# Patient Record
Sex: Female | Born: 1945
Health system: Southern US, Community
[De-identification: ages and names within clinical notes are randomized; demographics above are authoritative.]

## PROBLEM LIST (undated history)

## (undated) DIAGNOSIS — E782 Mixed hyperlipidemia: Secondary | ICD-10-CM

## (undated) DIAGNOSIS — R011 Cardiac murmur, unspecified: Secondary | ICD-10-CM

## (undated) DIAGNOSIS — C50919 Malignant neoplasm of unspecified site of unspecified female breast: Secondary | ICD-10-CM

## (undated) DIAGNOSIS — Z860100 Personal history of colon polyps, unspecified: Secondary | ICD-10-CM

## (undated) DIAGNOSIS — Z86718 Personal history of other venous thrombosis and embolism: Secondary | ICD-10-CM

## (undated) DIAGNOSIS — K5790 Diverticulosis of intestine, part unspecified, without perforation or abscess without bleeding: Secondary | ICD-10-CM

## (undated) DIAGNOSIS — Z8619 Personal history of other infectious and parasitic diseases: Secondary | ICD-10-CM

## (undated) DIAGNOSIS — I499 Cardiac arrhythmia, unspecified: Secondary | ICD-10-CM

## (undated) DIAGNOSIS — Z9221 Personal history of antineoplastic chemotherapy: Secondary | ICD-10-CM

## (undated) HISTORY — DX: Personal history of other infectious and parasitic diseases: Z86.19

## (undated) HISTORY — DX: Personal history of colon polyps, unspecified: Z86.0100

## (undated) HISTORY — DX: Personal history of other venous thrombosis and embolism: Z86.718

## (undated) HISTORY — PX: HERNIA REPAIR: SHX51

## (undated) HISTORY — DX: Mixed hyperlipidemia: E78.2

## (undated) HISTORY — DX: Diverticulosis of intestine, part unspecified, without perforation or abscess without bleeding: K57.90

## (undated) HISTORY — DX: Personal history of colonic polyps: Z86.010

## (undated) HISTORY — PX: TONSILLECTOMY: SUR1361

---

## 2013-05-25 ENCOUNTER — Encounter: Payer: Self-pay | Admitting: Cardiology

## 2014-02-16 ENCOUNTER — Encounter: Payer: Self-pay | Admitting: Cardiology

## 2014-03-02 ENCOUNTER — Encounter: Payer: Self-pay | Admitting: Cardiology

## 2014-03-07 ENCOUNTER — Encounter: Payer: Self-pay | Admitting: *Deleted

## 2014-03-08 ENCOUNTER — Other Ambulatory Visit: Payer: Self-pay | Admitting: *Deleted

## 2014-03-08 ENCOUNTER — Ambulatory Visit (INDEPENDENT_AMBULATORY_CARE_PROVIDER_SITE_OTHER): Payer: Medicare Other | Admitting: Cardiology

## 2014-03-08 ENCOUNTER — Encounter: Payer: Self-pay | Admitting: Cardiology

## 2014-03-08 VITALS — BP 122/72 | HR 72 | Ht 60.0 in | Wt 134.0 lb

## 2014-03-08 DIAGNOSIS — R002 Palpitations: Secondary | ICD-10-CM

## 2014-03-08 DIAGNOSIS — I471 Supraventricular tachycardia, unspecified: Secondary | ICD-10-CM

## 2014-03-08 DIAGNOSIS — R011 Cardiac murmur, unspecified: Secondary | ICD-10-CM

## 2014-03-08 DIAGNOSIS — E782 Mixed hyperlipidemia: Secondary | ICD-10-CM | POA: Insufficient documentation

## 2014-03-08 NOTE — Progress Notes (Signed)
Reason for visit: Palpitations  Clinical Summary Lisa Chapman is a 69 y.o.female referred for cardiology consultation by Dr. Woody Seller. Record review finds ER visit at [facility] on January 6 secondary to sudden onset dizziness and thoracic discomfort. Reportedly on EMS evaluation the patient's heart rate was 180 bpm. Labwork during evaluation showed potassium 3.8, BUN 11, creatinine 0.5, normal LFTs, hemoglobin 12.1, platelets 230, troponin I negative. No EMS strips available for review. ECG done during ER visit showed sinus rhythm with PAC, possible left atrial enlargement, nonspecific T-wave changes. Patient declined admission to the hospital for further evaluation at that time, saw Dr. Woody Seller in follow-up on January 13, and was started on Toprol-XL that time.  She describes the event to me today. States that she works in Geologist, engineering at [employer]. Was standing up working as usual when she suddenly felt dizzy and lightheaded, ultimately became aware of a sense of "fluttering" in her chest. She did not have any chest pain at that time. She did feel nauseated. EMS was summoned as noted above. Symptoms had improved by the time she was seen in the ER at which time her heart rate was down in normal range.  She did denies having any prolonged palpitations over the years, but does recall brief episodes, not as intense as the one in question. She feels better after starting beta blocker, has been taking Toprol-XL at only 12.5 mg daily. She has no reproducible exertional chest pain, has had no syncope with her palpitations.   Allergies  Allergen Reactions  . Boniva [Ibandronic Acid] Other (See Comments)    Epigastric discomfort  . Cortisone Hives    Current Outpatient Prescriptions  Medication Sig Dispense Refill  . metoprolol succinate (TOPROL-XL) 25 MG 24 hr tablet Take 12.5 mg by mouth daily.     . rosuvastatin (CRESTOR) 20 MG tablet Take 20 mg by mouth daily.     No current facility-administered medications  for this visit.    Past Medical History  Diagnosis Date  . Mixed hyperlipidemia   . Diverticulosis   . History of colonic polyps   . History of DVT (deep vein thrombosis)     Right leg, April 2015  . History of shingles     Past Surgical History  Procedure Laterality Date  . Tonsillectomy      Family History  Problem Relation Age of Onset  . COPD Father   . Heart attack Father   . Heart attack Mother     Social History Ms. Cleckley reports that she has never smoked. She has never used smokeless tobacco. Ms. Haapala reports that she does not drink alcohol.  Review of Systems Complete review of systems negative except as otherwise outlined in the clinical summary and also the following. No orthopnea or PND. No fevers or chills. No claudication or leg edema.  Physical Examination Filed Vitals:   03/08/14 0915  BP: 122/72  Pulse: 72    Wt Readings from Last 3 Encounters:  03/08/14 134 lb (60.782 kg)   Patient appears comfortable at rest. HEENT: Conjunctiva and lids normal, oropharynx clear. Neck: Supple, no elevated JVP or carotid bruits, no thyromegaly. Lungs: Clear to auscultation, nonlabored breathing at rest. Cardiac: Regular rate and rhythm, no S3, 2-3/6 coarse basal systolic murmur, no pericardial rub. Abdomen: Soft, nontender, bowel sounds present, no guarding or rebound. Extremities: No pitting edema, distal pulses 2+. Skin: Warm and dry. Musculoskeletal: No kyphosis. Neuropsychiatric: Alert and oriented x3, affect grossly appropriate.   Problem  List and Plan   Paroxysmal supraventricular tachycardia Presumptive diagnosis based on description of events and rapidity of reported heart rate response. She feels better on low-dose beta blocker. Baseline ECG reviewed. Plan is to obtain a seven-day cardiac monitor to see if might be able to capture a limited episodes to further confirm the diagnosis. I also explained that she should seek medical attention if she has  a prolonged episode, mainly to get a an ECG done to confirm the diagnosis and help guide further treatment.   Cardiac murmur Aortic position. Plan will be to obtain an echocardiogram to further evaluate cardiac structure and function, particularly in light of suspected PSVT.   Mixed hyperlipidemia On Crestor, followed by Dr. Woody Seller.     Satira Sark, M.D., F.A.C.C.

## 2014-03-08 NOTE — Assessment & Plan Note (Signed)
Aortic position. Plan will be to obtain an echocardiogram to further evaluate cardiac structure and function, particularly in light of suspected PSVT.

## 2014-03-08 NOTE — Assessment & Plan Note (Signed)
Presumptive diagnosis based on description of events and rapidity of reported heart rate response. She feels better on low-dose beta blocker. Baseline ECG reviewed. Plan is to obtain a seven-day cardiac monitor to see if might be able to capture a limited episodes to further confirm the diagnosis. I also explained that she should seek medical attention if she has a prolonged episode, mainly to get a an ECG done to confirm the diagnosis and help guide further treatment.

## 2014-03-08 NOTE — Patient Instructions (Signed)
Your physician recommends that you schedule a follow-up appointment in: 6 months. You will receive a reminder letter in the mail in about 4 months reminding you to call and schedule your appointment. If you don't receive this letter, please contact our office. Your physician recommends that you continue on your current medications as directed. Please refer to the Current Medication list given to you today. Your physician has requested that you have an echocardiogram. Echocardiography is a painless test that uses sound waves to create images of your heart. It provides your doctor with information about the size and shape of your heart and how well your heart's chambers and valves are working. This procedure takes approximately one hour. There are no restrictions for this procedure. Your physician has recommended that you wear a 7 day event monitor. Event monitors are medical devices that record the heart's electrical activity. Doctors most often [place] these monitors to diagnose arrhythmias. Arrhythmias are problems with the speed or rhythm of the heartbeat. The monitor is a small, portable device. You can wear one while you do your normal daily activities. This is usually used to diagnose what is causing palpitations/syncope (passing out).

## 2014-03-08 NOTE — Assessment & Plan Note (Signed)
On Crestor, followed by Dr. Woody Seller.

## 2014-03-10 ENCOUNTER — Other Ambulatory Visit (INDEPENDENT_AMBULATORY_CARE_PROVIDER_SITE_OTHER): Payer: Medicare Other

## 2014-03-10 ENCOUNTER — Other Ambulatory Visit: Payer: Self-pay

## 2014-03-10 DIAGNOSIS — I471 Supraventricular tachycardia: Secondary | ICD-10-CM

## 2014-03-10 DIAGNOSIS — R011 Cardiac murmur, unspecified: Secondary | ICD-10-CM

## 2014-03-10 DIAGNOSIS — R002 Palpitations: Secondary | ICD-10-CM

## 2014-03-15 ENCOUNTER — Telehealth: Payer: Self-pay | Admitting: *Deleted

## 2014-03-15 NOTE — Telephone Encounter (Signed)
-----   Message from Satira Sark, MD sent at 03/14/2014  9:14 AM EST ----- Reviewed report. Please let her know that cardiac contractile function is normal. The aortic valve is mildly calcified, but there is no stenosis. Also only mild mitral regurgitation. Overall reassuring results.

## 2014-03-15 NOTE — Telephone Encounter (Signed)
Patient informed. 

## 2014-09-16 ENCOUNTER — Telehealth: Payer: Self-pay | Admitting: Cardiology

## 2014-09-16 NOTE — Telephone Encounter (Signed)
Mrs. Herandez called in regards to a recall letter that she received for a six month follow up. She states that at this time she does not feel like she needs to be Seen by a cardiologist. Told her to call the office if she needs to set up an appointment.

## 2016-02-22 DIAGNOSIS — Z1231 Encounter for screening mammogram for malignant neoplasm of breast: Secondary | ICD-10-CM | POA: Diagnosis not present

## 2016-02-26 DIAGNOSIS — I10 Essential (primary) hypertension: Secondary | ICD-10-CM | POA: Diagnosis not present

## 2016-02-26 DIAGNOSIS — I8391 Asymptomatic varicose veins of right lower extremity: Secondary | ICD-10-CM | POA: Diagnosis not present

## 2016-02-26 DIAGNOSIS — Z87891 Personal history of nicotine dependence: Secondary | ICD-10-CM | POA: Diagnosis not present

## 2016-02-26 DIAGNOSIS — M810 Age-related osteoporosis without current pathological fracture: Secondary | ICD-10-CM | POA: Diagnosis not present

## 2016-02-26 DIAGNOSIS — Z299 Encounter for prophylactic measures, unspecified: Secondary | ICD-10-CM | POA: Diagnosis not present

## 2016-02-26 DIAGNOSIS — Z713 Dietary counseling and surveillance: Secondary | ICD-10-CM | POA: Diagnosis not present

## 2016-02-26 DIAGNOSIS — M79604 Pain in right leg: Secondary | ICD-10-CM | POA: Diagnosis not present

## 2016-02-26 DIAGNOSIS — Z6823 Body mass index (BMI) 23.0-23.9, adult: Secondary | ICD-10-CM | POA: Diagnosis not present

## 2016-02-26 DIAGNOSIS — Z79899 Other long term (current) drug therapy: Secondary | ICD-10-CM | POA: Diagnosis not present

## 2016-02-26 DIAGNOSIS — I82409 Acute embolism and thrombosis of unspecified deep veins of unspecified lower extremity: Secondary | ICD-10-CM | POA: Diagnosis not present

## 2016-03-22 DIAGNOSIS — Z713 Dietary counseling and surveillance: Secondary | ICD-10-CM | POA: Diagnosis not present

## 2016-03-22 DIAGNOSIS — Z6823 Body mass index (BMI) 23.0-23.9, adult: Secondary | ICD-10-CM | POA: Diagnosis not present

## 2016-03-22 DIAGNOSIS — M79672 Pain in left foot: Secondary | ICD-10-CM | POA: Diagnosis not present

## 2016-03-22 DIAGNOSIS — Z299 Encounter for prophylactic measures, unspecified: Secondary | ICD-10-CM | POA: Diagnosis not present

## 2016-04-10 DIAGNOSIS — T887XXA Unspecified adverse effect of drug or medicament, initial encounter: Secondary | ICD-10-CM | POA: Diagnosis not present

## 2016-05-29 DIAGNOSIS — I10 Essential (primary) hypertension: Secondary | ICD-10-CM | POA: Diagnosis not present

## 2016-05-29 DIAGNOSIS — R1032 Left lower quadrant pain: Secondary | ICD-10-CM | POA: Diagnosis not present

## 2016-05-29 DIAGNOSIS — Z79899 Other long term (current) drug therapy: Secondary | ICD-10-CM | POA: Diagnosis not present

## 2016-05-29 DIAGNOSIS — I82409 Acute embolism and thrombosis of unspecified deep veins of unspecified lower extremity: Secondary | ICD-10-CM | POA: Diagnosis not present

## 2016-05-29 DIAGNOSIS — M25512 Pain in left shoulder: Secondary | ICD-10-CM | POA: Diagnosis not present

## 2016-05-29 DIAGNOSIS — Z299 Encounter for prophylactic measures, unspecified: Secondary | ICD-10-CM | POA: Diagnosis not present

## 2016-05-29 DIAGNOSIS — Z713 Dietary counseling and surveillance: Secondary | ICD-10-CM | POA: Diagnosis not present

## 2016-05-29 DIAGNOSIS — Z6823 Body mass index (BMI) 23.0-23.9, adult: Secondary | ICD-10-CM | POA: Diagnosis not present

## 2016-05-29 DIAGNOSIS — B029 Zoster without complications: Secondary | ICD-10-CM | POA: Diagnosis not present

## 2016-06-05 DIAGNOSIS — I700 Atherosclerosis of aorta: Secondary | ICD-10-CM | POA: Diagnosis not present

## 2016-06-05 DIAGNOSIS — R1032 Left lower quadrant pain: Secondary | ICD-10-CM | POA: Diagnosis not present

## 2016-06-05 DIAGNOSIS — R109 Unspecified abdominal pain: Secondary | ICD-10-CM | POA: Diagnosis not present

## 2016-06-05 DIAGNOSIS — K5730 Diverticulosis of large intestine without perforation or abscess without bleeding: Secondary | ICD-10-CM | POA: Diagnosis not present

## 2016-06-07 DIAGNOSIS — I82409 Acute embolism and thrombosis of unspecified deep veins of unspecified lower extremity: Secondary | ICD-10-CM | POA: Diagnosis not present

## 2016-06-07 DIAGNOSIS — Z6823 Body mass index (BMI) 23.0-23.9, adult: Secondary | ICD-10-CM | POA: Diagnosis not present

## 2016-06-07 DIAGNOSIS — I10 Essential (primary) hypertension: Secondary | ICD-10-CM | POA: Diagnosis not present

## 2016-06-07 DIAGNOSIS — K5790 Diverticulosis of intestine, part unspecified, without perforation or abscess without bleeding: Secondary | ICD-10-CM | POA: Diagnosis not present

## 2016-06-07 DIAGNOSIS — K635 Polyp of colon: Secondary | ICD-10-CM | POA: Diagnosis not present

## 2016-06-07 DIAGNOSIS — Z299 Encounter for prophylactic measures, unspecified: Secondary | ICD-10-CM | POA: Diagnosis not present

## 2016-06-26 DIAGNOSIS — K5731 Diverticulosis of large intestine without perforation or abscess with bleeding: Secondary | ICD-10-CM | POA: Diagnosis not present

## 2016-06-26 DIAGNOSIS — Z86010 Personal history of colonic polyps: Secondary | ICD-10-CM | POA: Diagnosis not present

## 2016-06-26 DIAGNOSIS — R1032 Left lower quadrant pain: Secondary | ICD-10-CM | POA: Diagnosis not present

## 2016-07-03 DIAGNOSIS — Z6823 Body mass index (BMI) 23.0-23.9, adult: Secondary | ICD-10-CM | POA: Diagnosis not present

## 2016-07-03 DIAGNOSIS — M549 Dorsalgia, unspecified: Secondary | ICD-10-CM | POA: Diagnosis not present

## 2016-07-03 DIAGNOSIS — M4316 Spondylolisthesis, lumbar region: Secondary | ICD-10-CM | POA: Diagnosis not present

## 2016-07-03 DIAGNOSIS — M545 Low back pain: Secondary | ICD-10-CM | POA: Diagnosis not present

## 2016-07-03 DIAGNOSIS — M47816 Spondylosis without myelopathy or radiculopathy, lumbar region: Secondary | ICD-10-CM | POA: Diagnosis not present

## 2016-07-03 DIAGNOSIS — Z713 Dietary counseling and surveillance: Secondary | ICD-10-CM | POA: Diagnosis not present

## 2016-07-03 DIAGNOSIS — Z299 Encounter for prophylactic measures, unspecified: Secondary | ICD-10-CM | POA: Diagnosis not present

## 2016-07-23 DIAGNOSIS — R1032 Left lower quadrant pain: Secondary | ICD-10-CM | POA: Diagnosis not present

## 2016-07-23 DIAGNOSIS — Z86010 Personal history of colonic polyps: Secondary | ICD-10-CM | POA: Diagnosis not present

## 2016-09-10 DIAGNOSIS — Z6823 Body mass index (BMI) 23.0-23.9, adult: Secondary | ICD-10-CM | POA: Diagnosis not present

## 2016-09-10 DIAGNOSIS — I82409 Acute embolism and thrombosis of unspecified deep veins of unspecified lower extremity: Secondary | ICD-10-CM | POA: Diagnosis not present

## 2016-09-10 DIAGNOSIS — Z299 Encounter for prophylactic measures, unspecified: Secondary | ICD-10-CM | POA: Diagnosis not present

## 2016-09-10 DIAGNOSIS — I10 Essential (primary) hypertension: Secondary | ICD-10-CM | POA: Diagnosis not present

## 2016-09-10 DIAGNOSIS — E7800 Pure hypercholesterolemia, unspecified: Secondary | ICD-10-CM | POA: Diagnosis not present

## 2016-09-10 DIAGNOSIS — M810 Age-related osteoporosis without current pathological fracture: Secondary | ICD-10-CM | POA: Diagnosis not present

## 2016-09-10 DIAGNOSIS — Z1389 Encounter for screening for other disorder: Secondary | ICD-10-CM | POA: Diagnosis not present

## 2016-09-10 DIAGNOSIS — Z7189 Other specified counseling: Secondary | ICD-10-CM | POA: Diagnosis not present

## 2016-09-10 DIAGNOSIS — Z0000 Encounter for general adult medical examination without abnormal findings: Secondary | ICD-10-CM | POA: Diagnosis not present

## 2016-09-10 DIAGNOSIS — R5383 Other fatigue: Secondary | ICD-10-CM | POA: Diagnosis not present

## 2016-09-26 DIAGNOSIS — Z0000 Encounter for general adult medical examination without abnormal findings: Secondary | ICD-10-CM | POA: Diagnosis not present

## 2016-09-26 DIAGNOSIS — Z79899 Other long term (current) drug therapy: Secondary | ICD-10-CM | POA: Diagnosis not present

## 2016-09-26 DIAGNOSIS — E7800 Pure hypercholesterolemia, unspecified: Secondary | ICD-10-CM | POA: Diagnosis not present

## 2016-09-26 DIAGNOSIS — R5383 Other fatigue: Secondary | ICD-10-CM | POA: Diagnosis not present

## 2016-12-23 DIAGNOSIS — R69 Illness, unspecified: Secondary | ICD-10-CM | POA: Diagnosis not present

## 2017-01-07 DIAGNOSIS — R69 Illness, unspecified: Secondary | ICD-10-CM | POA: Diagnosis not present

## 2017-01-28 DIAGNOSIS — R69 Illness, unspecified: Secondary | ICD-10-CM | POA: Diagnosis not present

## 2017-03-12 DIAGNOSIS — R69 Illness, unspecified: Secondary | ICD-10-CM | POA: Diagnosis not present

## 2017-03-19 DIAGNOSIS — R69 Illness, unspecified: Secondary | ICD-10-CM | POA: Diagnosis not present

## 2017-03-31 DIAGNOSIS — Z299 Encounter for prophylactic measures, unspecified: Secondary | ICD-10-CM | POA: Diagnosis not present

## 2017-03-31 DIAGNOSIS — R109 Unspecified abdominal pain: Secondary | ICD-10-CM | POA: Diagnosis not present

## 2017-03-31 DIAGNOSIS — Z713 Dietary counseling and surveillance: Secondary | ICD-10-CM | POA: Diagnosis not present

## 2017-03-31 DIAGNOSIS — R1032 Left lower quadrant pain: Secondary | ICD-10-CM | POA: Diagnosis not present

## 2017-03-31 DIAGNOSIS — Z6824 Body mass index (BMI) 24.0-24.9, adult: Secondary | ICD-10-CM | POA: Diagnosis not present

## 2017-04-02 DIAGNOSIS — I10 Essential (primary) hypertension: Secondary | ICD-10-CM | POA: Diagnosis not present

## 2017-04-03 DIAGNOSIS — R1032 Left lower quadrant pain: Secondary | ICD-10-CM | POA: Diagnosis not present

## 2017-04-03 DIAGNOSIS — K5730 Diverticulosis of large intestine without perforation or abscess without bleeding: Secondary | ICD-10-CM | POA: Diagnosis not present

## 2017-04-03 DIAGNOSIS — N281 Cyst of kidney, acquired: Secondary | ICD-10-CM | POA: Diagnosis not present

## 2017-04-03 DIAGNOSIS — K5790 Diverticulosis of intestine, part unspecified, without perforation or abscess without bleeding: Secondary | ICD-10-CM | POA: Diagnosis not present

## 2017-04-16 DIAGNOSIS — K5790 Diverticulosis of intestine, part unspecified, without perforation or abscess without bleeding: Secondary | ICD-10-CM | POA: Diagnosis not present

## 2017-04-16 DIAGNOSIS — Z6825 Body mass index (BMI) 25.0-25.9, adult: Secondary | ICD-10-CM | POA: Diagnosis not present

## 2017-04-16 DIAGNOSIS — S39011A Strain of muscle, fascia and tendon of abdomen, initial encounter: Secondary | ICD-10-CM | POA: Diagnosis not present

## 2017-04-16 DIAGNOSIS — Z299 Encounter for prophylactic measures, unspecified: Secondary | ICD-10-CM | POA: Diagnosis not present

## 2017-04-16 DIAGNOSIS — R109 Unspecified abdominal pain: Secondary | ICD-10-CM | POA: Diagnosis not present

## 2017-04-16 DIAGNOSIS — I10 Essential (primary) hypertension: Secondary | ICD-10-CM | POA: Diagnosis not present

## 2017-05-01 DIAGNOSIS — R1032 Left lower quadrant pain: Secondary | ICD-10-CM | POA: Diagnosis not present

## 2017-06-12 DIAGNOSIS — R1032 Left lower quadrant pain: Secondary | ICD-10-CM | POA: Diagnosis not present

## 2017-06-12 DIAGNOSIS — Z299 Encounter for prophylactic measures, unspecified: Secondary | ICD-10-CM | POA: Diagnosis not present

## 2017-06-12 DIAGNOSIS — E7800 Pure hypercholesterolemia, unspecified: Secondary | ICD-10-CM | POA: Diagnosis not present

## 2017-06-12 DIAGNOSIS — Z6824 Body mass index (BMI) 24.0-24.9, adult: Secondary | ICD-10-CM | POA: Diagnosis not present

## 2017-06-12 DIAGNOSIS — I82409 Acute embolism and thrombosis of unspecified deep veins of unspecified lower extremity: Secondary | ICD-10-CM | POA: Diagnosis not present

## 2017-06-30 ENCOUNTER — Encounter (INDEPENDENT_AMBULATORY_CARE_PROVIDER_SITE_OTHER): Payer: Self-pay | Admitting: Internal Medicine

## 2017-06-30 ENCOUNTER — Ambulatory Visit (INDEPENDENT_AMBULATORY_CARE_PROVIDER_SITE_OTHER): Payer: Medicare HMO | Admitting: Internal Medicine

## 2017-06-30 VITALS — BP 146/72 | HR 76 | Temp 98.6°F | Ht 60.0 in | Wt 141.0 lb

## 2017-06-30 DIAGNOSIS — R1012 Left upper quadrant pain: Secondary | ICD-10-CM | POA: Diagnosis not present

## 2017-06-30 DIAGNOSIS — M545 Low back pain: Secondary | ICD-10-CM | POA: Diagnosis not present

## 2017-06-30 NOTE — Progress Notes (Addendum)
   Subjective:    Patient ID: Lisa Chapman, female    DOB: 1945/08/19, 72 y.o.   MRN: [id number]  HPI Referred by Dr. Woody Seller for LLQ pain. CT scan 04/03/2017 revealed diverticulosis without diverticulitis. Stable appearing rt renal cyst. No acute abnormalities. Her last colonoscopy in 2018 by Dr Posey Pronto for this pain. She says the colonoscopy was normal. She had a  Colonoscopy in 2011 by Dr. Posey Pronto which revealed moderate diverticulosis in the sigmoid colon. Sessile polyp in ;the cecum. Sessile polyp in the sigmoid colon, Sessile polyp in the ascending colon. Biopsy: Tubular adenoma, Tubulovillous adenoma.  She tells me sometimes she has pain in her LLQ. She says the pain will get so bad she feels like she is being torn apart. The pain comes and goes.  She says it leaves her skin feel raw to the touch. Her last pelvic exam was a couple of years ago.  No weight loss. Appetite is good. She has a BM daily.      Review of Systems Past Medical History:  Diagnosis Date  . Diverticulosis   . History of colonic polyps   . History of DVT (deep vein thrombosis)    Right leg, April 2015  . History of shingles   . Mixed hyperlipidemia     Past Surgical History:  Procedure Laterality Date  . TONSILLECTOMY      Allergies  Allergen Reactions  . Boniva [Ibandronic Acid] Other (See Comments)    Epigastric discomfort  . Cortisone Hives    Current Outpatient Medications on File Prior to Visit  Medication Sig Dispense Refill  . gabapentin (NEURONTIN) 100 MG capsule Take 100 mg by mouth 2 (two) times daily before a meal.    . rosuvastatin (CRESTOR) 20 MG tablet Take 20 mg by mouth daily.    . traMADol (ULTRAM) 50 MG tablet Take by mouth every 6 (six) hours as needed.     No current facility-administered medications on file prior to visit.         Objective:   Physical Exam Blood pressure (!) 146/72, pulse 76, temperature 98.6 F (37 C), height 5' (1.524 m), weight 141 lb (64 kg). Alert and  oriented. Skin warm and dry. Oral mucosa is moist.   . Sclera anicteric, conjunctivae is pink. Thyroid not enlarged. No cervical lymphadenopathy. Lungs clear. Heart regular rate and rhythm.  Abdomen is soft. Bowel sounds are positive. No hepatomegaly. No abdominal masses felt. No tenderness.  No edema to lower extremities.  Straight leg raises reproduced her LLQ pain and back pain.          Assessment & Plan:  LLQ pain. Back pain.  Suspect may be coming from her back. Will get a CT lumbar spine. Further recommendations to follow.

## 2017-06-30 NOTE — Patient Instructions (Signed)
CT Lumbar spine.

## 2017-07-01 NOTE — Addendum Note (Signed)
Addended by: Butch Penny on: 07/01/2017 09:21 AM   Modules accepted: Orders

## 2017-08-13 DIAGNOSIS — M545 Low back pain: Secondary | ICD-10-CM | POA: Diagnosis not present

## 2017-08-13 DIAGNOSIS — Z87891 Personal history of nicotine dependence: Secondary | ICD-10-CM | POA: Diagnosis not present

## 2017-08-13 DIAGNOSIS — Z299 Encounter for prophylactic measures, unspecified: Secondary | ICD-10-CM | POA: Diagnosis not present

## 2017-08-13 DIAGNOSIS — Z6824 Body mass index (BMI) 24.0-24.9, adult: Secondary | ICD-10-CM | POA: Diagnosis not present

## 2017-08-13 DIAGNOSIS — M25552 Pain in left hip: Secondary | ICD-10-CM | POA: Diagnosis not present

## 2017-08-13 DIAGNOSIS — E7800 Pure hypercholesterolemia, unspecified: Secondary | ICD-10-CM | POA: Diagnosis not present

## 2017-08-13 DIAGNOSIS — R1032 Left lower quadrant pain: Secondary | ICD-10-CM | POA: Diagnosis not present

## 2017-08-27 DIAGNOSIS — Z713 Dietary counseling and surveillance: Secondary | ICD-10-CM | POA: Diagnosis not present

## 2017-08-27 DIAGNOSIS — Z6824 Body mass index (BMI) 24.0-24.9, adult: Secondary | ICD-10-CM | POA: Diagnosis not present

## 2017-08-27 DIAGNOSIS — Z299 Encounter for prophylactic measures, unspecified: Secondary | ICD-10-CM | POA: Diagnosis not present

## 2017-08-27 DIAGNOSIS — R109 Unspecified abdominal pain: Secondary | ICD-10-CM | POA: Diagnosis not present

## 2017-09-18 DIAGNOSIS — K439 Ventral hernia without obstruction or gangrene: Secondary | ICD-10-CM | POA: Diagnosis not present

## 2017-09-18 DIAGNOSIS — R109 Unspecified abdominal pain: Secondary | ICD-10-CM | POA: Diagnosis not present

## 2017-09-26 DIAGNOSIS — K439 Ventral hernia without obstruction or gangrene: Secondary | ICD-10-CM | POA: Diagnosis not present

## 2017-09-26 DIAGNOSIS — G8918 Other acute postprocedural pain: Secondary | ICD-10-CM | POA: Diagnosis not present

## 2017-09-26 DIAGNOSIS — K429 Umbilical hernia without obstruction or gangrene: Secondary | ICD-10-CM | POA: Diagnosis not present

## 2017-10-06 DIAGNOSIS — I82431 Acute embolism and thrombosis of right popliteal vein: Secondary | ICD-10-CM | POA: Diagnosis not present

## 2017-10-06 DIAGNOSIS — Z9889 Other specified postprocedural states: Secondary | ICD-10-CM | POA: Diagnosis not present

## 2017-10-06 DIAGNOSIS — I10 Essential (primary) hypertension: Secondary | ICD-10-CM | POA: Diagnosis not present

## 2017-10-06 DIAGNOSIS — I82411 Acute embolism and thrombosis of right femoral vein: Secondary | ICD-10-CM | POA: Diagnosis not present

## 2017-10-06 DIAGNOSIS — I82409 Acute embolism and thrombosis of unspecified deep veins of unspecified lower extremity: Secondary | ICD-10-CM | POA: Diagnosis not present

## 2017-10-06 DIAGNOSIS — Z6824 Body mass index (BMI) 24.0-24.9, adult: Secondary | ICD-10-CM | POA: Diagnosis not present

## 2017-10-06 DIAGNOSIS — I808 Phlebitis and thrombophlebitis of other sites: Secondary | ICD-10-CM | POA: Diagnosis not present

## 2017-10-06 DIAGNOSIS — Z8719 Personal history of other diseases of the digestive system: Secondary | ICD-10-CM | POA: Diagnosis not present

## 2017-10-14 DIAGNOSIS — I8312 Varicose veins of left lower extremity with inflammation: Secondary | ICD-10-CM | POA: Diagnosis not present

## 2017-10-14 DIAGNOSIS — I8311 Varicose veins of right lower extremity with inflammation: Secondary | ICD-10-CM | POA: Diagnosis not present

## 2017-10-14 DIAGNOSIS — I82411 Acute embolism and thrombosis of right femoral vein: Secondary | ICD-10-CM | POA: Diagnosis not present

## 2017-10-14 DIAGNOSIS — I824Z1 Acute embolism and thrombosis of unspecified deep veins of right distal lower extremity: Secondary | ICD-10-CM | POA: Diagnosis not present

## 2017-10-23 DIAGNOSIS — Z48815 Encounter for surgical aftercare following surgery on the digestive system: Secondary | ICD-10-CM | POA: Diagnosis not present

## 2017-10-23 DIAGNOSIS — Z8719 Personal history of other diseases of the digestive system: Secondary | ICD-10-CM | POA: Diagnosis not present

## 2017-10-27 DIAGNOSIS — Z713 Dietary counseling and surveillance: Secondary | ICD-10-CM | POA: Diagnosis not present

## 2017-10-27 DIAGNOSIS — I10 Essential (primary) hypertension: Secondary | ICD-10-CM | POA: Diagnosis not present

## 2017-10-27 DIAGNOSIS — Z299 Encounter for prophylactic measures, unspecified: Secondary | ICD-10-CM | POA: Diagnosis not present

## 2017-10-27 DIAGNOSIS — Z6825 Body mass index (BMI) 25.0-25.9, adult: Secondary | ICD-10-CM | POA: Diagnosis not present

## 2017-10-27 DIAGNOSIS — I82409 Acute embolism and thrombosis of unspecified deep veins of unspecified lower extremity: Secondary | ICD-10-CM | POA: Diagnosis not present

## 2017-10-29 DIAGNOSIS — R69 Illness, unspecified: Secondary | ICD-10-CM | POA: Diagnosis not present

## 2017-11-10 DIAGNOSIS — E7800 Pure hypercholesterolemia, unspecified: Secondary | ICD-10-CM | POA: Diagnosis not present

## 2017-11-10 DIAGNOSIS — Z299 Encounter for prophylactic measures, unspecified: Secondary | ICD-10-CM | POA: Diagnosis not present

## 2017-11-10 DIAGNOSIS — I82409 Acute embolism and thrombosis of unspecified deep veins of unspecified lower extremity: Secondary | ICD-10-CM | POA: Diagnosis not present

## 2017-11-10 DIAGNOSIS — Z7189 Other specified counseling: Secondary | ICD-10-CM | POA: Diagnosis not present

## 2017-11-10 DIAGNOSIS — Z0000 Encounter for general adult medical examination without abnormal findings: Secondary | ICD-10-CM | POA: Diagnosis not present

## 2017-11-10 DIAGNOSIS — Z1331 Encounter for screening for depression: Secondary | ICD-10-CM | POA: Diagnosis not present

## 2017-11-10 DIAGNOSIS — I10 Essential (primary) hypertension: Secondary | ICD-10-CM | POA: Diagnosis not present

## 2017-11-10 DIAGNOSIS — Z1339 Encounter for screening examination for other mental health and behavioral disorders: Secondary | ICD-10-CM | POA: Diagnosis not present

## 2017-11-10 DIAGNOSIS — Z6824 Body mass index (BMI) 24.0-24.9, adult: Secondary | ICD-10-CM | POA: Diagnosis not present

## 2017-11-11 DIAGNOSIS — E7800 Pure hypercholesterolemia, unspecified: Secondary | ICD-10-CM | POA: Diagnosis not present

## 2017-11-11 DIAGNOSIS — Z0000 Encounter for general adult medical examination without abnormal findings: Secondary | ICD-10-CM | POA: Diagnosis not present

## 2017-11-11 DIAGNOSIS — Z79899 Other long term (current) drug therapy: Secondary | ICD-10-CM | POA: Diagnosis not present

## 2017-11-12 DIAGNOSIS — R69 Illness, unspecified: Secondary | ICD-10-CM | POA: Diagnosis not present

## 2017-11-24 DIAGNOSIS — R69 Illness, unspecified: Secondary | ICD-10-CM | POA: Diagnosis not present

## 2017-11-25 DIAGNOSIS — I8311 Varicose veins of right lower extremity with inflammation: Secondary | ICD-10-CM | POA: Diagnosis not present

## 2017-11-25 DIAGNOSIS — I824Z1 Acute embolism and thrombosis of unspecified deep veins of right distal lower extremity: Secondary | ICD-10-CM | POA: Diagnosis not present

## 2017-11-25 DIAGNOSIS — I8312 Varicose veins of left lower extremity with inflammation: Secondary | ICD-10-CM | POA: Diagnosis not present

## 2017-11-25 DIAGNOSIS — I82411 Acute embolism and thrombosis of right femoral vein: Secondary | ICD-10-CM | POA: Diagnosis not present

## 2018-01-19 DIAGNOSIS — R69 Illness, unspecified: Secondary | ICD-10-CM | POA: Diagnosis not present

## 2018-02-02 DIAGNOSIS — I83813 Varicose veins of bilateral lower extremities with pain: Secondary | ICD-10-CM | POA: Diagnosis not present

## 2018-02-02 DIAGNOSIS — I8311 Varicose veins of right lower extremity with inflammation: Secondary | ICD-10-CM | POA: Diagnosis not present

## 2018-02-02 DIAGNOSIS — I82411 Acute embolism and thrombosis of right femoral vein: Secondary | ICD-10-CM | POA: Diagnosis not present

## 2018-02-02 DIAGNOSIS — I8312 Varicose veins of left lower extremity with inflammation: Secondary | ICD-10-CM | POA: Diagnosis not present

## 2018-02-16 DIAGNOSIS — R69 Illness, unspecified: Secondary | ICD-10-CM | POA: Diagnosis not present

## 2018-03-05 DIAGNOSIS — I8312 Varicose veins of left lower extremity with inflammation: Secondary | ICD-10-CM | POA: Diagnosis not present

## 2018-03-05 DIAGNOSIS — I83812 Varicose veins of left lower extremities with pain: Secondary | ICD-10-CM | POA: Diagnosis not present

## 2018-03-09 DIAGNOSIS — I8312 Varicose veins of left lower extremity with inflammation: Secondary | ICD-10-CM | POA: Diagnosis not present

## 2018-03-12 DIAGNOSIS — I82409 Acute embolism and thrombosis of unspecified deep veins of unspecified lower extremity: Secondary | ICD-10-CM | POA: Diagnosis not present

## 2018-03-12 DIAGNOSIS — Z299 Encounter for prophylactic measures, unspecified: Secondary | ICD-10-CM | POA: Diagnosis not present

## 2018-03-12 DIAGNOSIS — Z6825 Body mass index (BMI) 25.0-25.9, adult: Secondary | ICD-10-CM | POA: Diagnosis not present

## 2018-03-12 DIAGNOSIS — Z789 Other specified health status: Secondary | ICD-10-CM | POA: Diagnosis not present

## 2018-03-12 DIAGNOSIS — I10 Essential (primary) hypertension: Secondary | ICD-10-CM | POA: Diagnosis not present

## 2018-03-12 DIAGNOSIS — E7800 Pure hypercholesterolemia, unspecified: Secondary | ICD-10-CM | POA: Diagnosis not present

## 2018-03-20 DIAGNOSIS — E2839 Other primary ovarian failure: Secondary | ICD-10-CM | POA: Diagnosis not present

## 2018-03-30 DIAGNOSIS — M722 Plantar fascial fibromatosis: Secondary | ICD-10-CM | POA: Diagnosis not present

## 2018-04-14 DIAGNOSIS — I10 Essential (primary) hypertension: Secondary | ICD-10-CM | POA: Diagnosis not present

## 2018-04-14 DIAGNOSIS — J069 Acute upper respiratory infection, unspecified: Secondary | ICD-10-CM | POA: Diagnosis not present

## 2018-04-14 DIAGNOSIS — Z299 Encounter for prophylactic measures, unspecified: Secondary | ICD-10-CM | POA: Diagnosis not present

## 2018-04-14 DIAGNOSIS — I82409 Acute embolism and thrombosis of unspecified deep veins of unspecified lower extremity: Secondary | ICD-10-CM | POA: Diagnosis not present

## 2018-04-14 DIAGNOSIS — R6889 Other general symptoms and signs: Secondary | ICD-10-CM | POA: Diagnosis not present

## 2018-04-14 DIAGNOSIS — Z6825 Body mass index (BMI) 25.0-25.9, adult: Secondary | ICD-10-CM | POA: Diagnosis not present

## 2018-05-01 DIAGNOSIS — I83812 Varicose veins of left lower extremities with pain: Secondary | ICD-10-CM | POA: Diagnosis not present

## 2018-05-01 DIAGNOSIS — I8312 Varicose veins of left lower extremity with inflammation: Secondary | ICD-10-CM | POA: Diagnosis not present

## 2018-06-22 DIAGNOSIS — R69 Illness, unspecified: Secondary | ICD-10-CM | POA: Diagnosis not present

## 2018-07-08 DIAGNOSIS — I8312 Varicose veins of left lower extremity with inflammation: Secondary | ICD-10-CM | POA: Diagnosis not present

## 2018-07-08 DIAGNOSIS — I83812 Varicose veins of left lower extremities with pain: Secondary | ICD-10-CM | POA: Diagnosis not present

## 2018-07-13 DIAGNOSIS — R69 Illness, unspecified: Secondary | ICD-10-CM | POA: Diagnosis not present

## 2018-08-17 DIAGNOSIS — H5203 Hypermetropia, bilateral: Secondary | ICD-10-CM | POA: Diagnosis not present

## 2018-08-17 DIAGNOSIS — H524 Presbyopia: Secondary | ICD-10-CM | POA: Diagnosis not present

## 2018-08-17 DIAGNOSIS — H52229 Regular astigmatism, unspecified eye: Secondary | ICD-10-CM | POA: Diagnosis not present

## 2018-08-17 DIAGNOSIS — H5213 Myopia, bilateral: Secondary | ICD-10-CM | POA: Diagnosis not present

## 2018-08-24 DIAGNOSIS — Z0100 Encounter for examination of eyes and vision without abnormal findings: Secondary | ICD-10-CM | POA: Diagnosis not present

## 2018-09-09 DIAGNOSIS — I83812 Varicose veins of left lower extremities with pain: Secondary | ICD-10-CM | POA: Diagnosis not present

## 2018-09-09 DIAGNOSIS — M7981 Nontraumatic hematoma of soft tissue: Secondary | ICD-10-CM | POA: Diagnosis not present

## 2018-09-09 DIAGNOSIS — I8312 Varicose veins of left lower extremity with inflammation: Secondary | ICD-10-CM | POA: Diagnosis not present

## 2018-09-23 DIAGNOSIS — M7981 Nontraumatic hematoma of soft tissue: Secondary | ICD-10-CM | POA: Diagnosis not present

## 2018-09-23 DIAGNOSIS — I8312 Varicose veins of left lower extremity with inflammation: Secondary | ICD-10-CM | POA: Diagnosis not present

## 2018-10-14 DIAGNOSIS — I10 Essential (primary) hypertension: Secondary | ICD-10-CM | POA: Diagnosis not present

## 2018-10-14 DIAGNOSIS — Z299 Encounter for prophylactic measures, unspecified: Secondary | ICD-10-CM | POA: Diagnosis not present

## 2018-10-14 DIAGNOSIS — Z6825 Body mass index (BMI) 25.0-25.9, adult: Secondary | ICD-10-CM | POA: Diagnosis not present

## 2018-10-14 DIAGNOSIS — R350 Frequency of micturition: Secondary | ICD-10-CM | POA: Diagnosis not present

## 2018-10-14 DIAGNOSIS — E7800 Pure hypercholesterolemia, unspecified: Secondary | ICD-10-CM | POA: Diagnosis not present

## 2018-10-14 DIAGNOSIS — I82409 Acute embolism and thrombosis of unspecified deep veins of unspecified lower extremity: Secondary | ICD-10-CM | POA: Diagnosis not present

## 2018-11-30 DIAGNOSIS — I10 Essential (primary) hypertension: Secondary | ICD-10-CM | POA: Diagnosis not present

## 2018-11-30 DIAGNOSIS — Z1211 Encounter for screening for malignant neoplasm of colon: Secondary | ICD-10-CM | POA: Diagnosis not present

## 2018-11-30 DIAGNOSIS — Z7189 Other specified counseling: Secondary | ICD-10-CM | POA: Diagnosis not present

## 2018-11-30 DIAGNOSIS — Z1331 Encounter for screening for depression: Secondary | ICD-10-CM | POA: Diagnosis not present

## 2018-11-30 DIAGNOSIS — Z6825 Body mass index (BMI) 25.0-25.9, adult: Secondary | ICD-10-CM | POA: Diagnosis not present

## 2018-11-30 DIAGNOSIS — Z79899 Other long term (current) drug therapy: Secondary | ICD-10-CM | POA: Diagnosis not present

## 2018-11-30 DIAGNOSIS — R5383 Other fatigue: Secondary | ICD-10-CM | POA: Diagnosis not present

## 2018-11-30 DIAGNOSIS — Z0000 Encounter for general adult medical examination without abnormal findings: Secondary | ICD-10-CM | POA: Diagnosis not present

## 2018-11-30 DIAGNOSIS — Z1339 Encounter for screening examination for other mental health and behavioral disorders: Secondary | ICD-10-CM | POA: Diagnosis not present

## 2018-11-30 DIAGNOSIS — E7800 Pure hypercholesterolemia, unspecified: Secondary | ICD-10-CM | POA: Diagnosis not present

## 2018-11-30 DIAGNOSIS — Z299 Encounter for prophylactic measures, unspecified: Secondary | ICD-10-CM | POA: Diagnosis not present

## 2018-12-17 DIAGNOSIS — M79605 Pain in left leg: Secondary | ICD-10-CM | POA: Diagnosis not present

## 2018-12-17 DIAGNOSIS — I8312 Varicose veins of left lower extremity with inflammation: Secondary | ICD-10-CM | POA: Diagnosis not present

## 2018-12-23 DIAGNOSIS — M25562 Pain in left knee: Secondary | ICD-10-CM | POA: Diagnosis not present

## 2018-12-23 DIAGNOSIS — I10 Essential (primary) hypertension: Secondary | ICD-10-CM | POA: Diagnosis not present

## 2018-12-23 DIAGNOSIS — Z6824 Body mass index (BMI) 24.0-24.9, adult: Secondary | ICD-10-CM | POA: Diagnosis not present

## 2018-12-23 DIAGNOSIS — Z713 Dietary counseling and surveillance: Secondary | ICD-10-CM | POA: Diagnosis not present

## 2018-12-23 DIAGNOSIS — Z299 Encounter for prophylactic measures, unspecified: Secondary | ICD-10-CM | POA: Diagnosis not present

## 2018-12-23 DIAGNOSIS — M1712 Unilateral primary osteoarthritis, left knee: Secondary | ICD-10-CM | POA: Diagnosis not present

## 2018-12-30 DIAGNOSIS — I824Z2 Acute embolism and thrombosis of unspecified deep veins of left distal lower extremity: Secondary | ICD-10-CM | POA: Diagnosis not present

## 2019-01-12 DIAGNOSIS — Z299 Encounter for prophylactic measures, unspecified: Secondary | ICD-10-CM | POA: Diagnosis not present

## 2019-01-12 DIAGNOSIS — I10 Essential (primary) hypertension: Secondary | ICD-10-CM | POA: Diagnosis not present

## 2019-01-12 DIAGNOSIS — Z6824 Body mass index (BMI) 24.0-24.9, adult: Secondary | ICD-10-CM | POA: Diagnosis not present

## 2019-01-12 DIAGNOSIS — M25362 Other instability, left knee: Secondary | ICD-10-CM | POA: Diagnosis not present

## 2019-01-12 DIAGNOSIS — I82409 Acute embolism and thrombosis of unspecified deep veins of unspecified lower extremity: Secondary | ICD-10-CM | POA: Diagnosis not present

## 2019-01-21 DIAGNOSIS — I10 Essential (primary) hypertension: Secondary | ICD-10-CM | POA: Diagnosis not present

## 2019-01-21 DIAGNOSIS — I82409 Acute embolism and thrombosis of unspecified deep veins of unspecified lower extremity: Secondary | ICD-10-CM | POA: Diagnosis not present

## 2019-01-21 DIAGNOSIS — M1710 Unilateral primary osteoarthritis, unspecified knee: Secondary | ICD-10-CM | POA: Diagnosis not present

## 2019-01-21 DIAGNOSIS — Z299 Encounter for prophylactic measures, unspecified: Secondary | ICD-10-CM | POA: Diagnosis not present

## 2019-01-21 DIAGNOSIS — Z6824 Body mass index (BMI) 24.0-24.9, adult: Secondary | ICD-10-CM | POA: Diagnosis not present

## 2019-01-29 DIAGNOSIS — M25562 Pain in left knee: Secondary | ICD-10-CM | POA: Diagnosis not present

## 2019-02-03 DIAGNOSIS — M25561 Pain in right knee: Secondary | ICD-10-CM | POA: Diagnosis not present

## 2019-02-03 DIAGNOSIS — X58XXXA Exposure to other specified factors, initial encounter: Secondary | ICD-10-CM | POA: Diagnosis not present

## 2019-02-03 DIAGNOSIS — M1712 Unilateral primary osteoarthritis, left knee: Secondary | ICD-10-CM | POA: Diagnosis not present

## 2019-02-03 DIAGNOSIS — S83242A Other tear of medial meniscus, current injury, left knee, initial encounter: Secondary | ICD-10-CM | POA: Diagnosis not present

## 2019-02-11 ENCOUNTER — Ambulatory Visit: Payer: Medicare HMO | Admitting: Orthopaedic Surgery

## 2019-02-16 DIAGNOSIS — R69 Illness, unspecified: Secondary | ICD-10-CM | POA: Diagnosis not present

## 2019-02-19 DIAGNOSIS — M25572 Pain in left ankle and joints of left foot: Secondary | ICD-10-CM | POA: Diagnosis not present

## 2019-02-24 DIAGNOSIS — I82409 Acute embolism and thrombosis of unspecified deep veins of unspecified lower extremity: Secondary | ICD-10-CM | POA: Diagnosis not present

## 2019-02-24 DIAGNOSIS — I10 Essential (primary) hypertension: Secondary | ICD-10-CM | POA: Diagnosis not present

## 2019-02-24 DIAGNOSIS — Z01818 Encounter for other preprocedural examination: Secondary | ICD-10-CM | POA: Diagnosis not present

## 2019-02-24 DIAGNOSIS — Z6824 Body mass index (BMI) 24.0-24.9, adult: Secondary | ICD-10-CM | POA: Diagnosis not present

## 2019-02-24 DIAGNOSIS — M25562 Pain in left knee: Secondary | ICD-10-CM | POA: Diagnosis not present

## 2019-02-24 DIAGNOSIS — Z299 Encounter for prophylactic measures, unspecified: Secondary | ICD-10-CM | POA: Diagnosis not present

## 2019-03-08 ENCOUNTER — Telehealth: Payer: Self-pay | Admitting: Hematology and Oncology

## 2019-03-08 NOTE — Telephone Encounter (Signed)
Received a new hem referral from Dr. Woody Seller for surgical clearance for DVT/PE. Lisa Chapman returned my call to schedule a new appt on 2/1 at 345pm w/Dr. Lindi Adie.

## 2019-03-14 NOTE — Progress Notes (Signed)
[place] CONSULT NOTE  Patient Care Team: [name], MD as PCP - General (Internal Medicine)  CHIEF COMPLAINTS/PURPOSE OF CONSULTATION:  History of DVTs, clearance for surgery  HISTORY OF PRESENTING ILLNESS:  Lisa Chapman 74 y.o. female is here because of a history of chronic bilateral lower extremity DVTs, currently on treatment with Xarelto 10mg  daily. She is referred by Dr. Woody Seller for surgical clearance prior to undergoing left knee surgery. She presents to the clinic today for initial evaluation.  Her first blood clot was in 2016 when a sledgehammer fell on her leg and she was immobile for a few days.  This was a right leg DVT.  She took Xarelto for 6 months.  In 2018 she had hernia surgery and then subsequently developed right leg DVT and took Xarelto for 1 year.  In 2019 she had left leg vein stripping and then developed left leg DVT for which she has taken Xarelto at 10 mg daily.  It is unclear why 10 mg were chosen.  She never had any trouble taking Xarelto.  She once tried Eliquis and did not tolerate it. She was having multiple problems with her left knee and was diagnosed to have a meniscus tear and she is likely going to need orthopedic surgery by Dr. Charlynne Pander.  We were consulted to discuss perioperative anticoagulation management. Her major questions today are why does she have recurrent blood clots and what to do about the upcoming surgery.  I reviewed her records extensively and collaborated the history with the patient.  MEDICAL HISTORY:  Past Medical History:  Diagnosis Date  . Diverticulosis   . History of colonic polyps   . History of DVT (deep vein thrombosis)    Right leg, April 2015  . History of shingles   . Mixed hyperlipidemia     SURGICAL HISTORY: Past Surgical History:  Procedure Laterality Date  . TONSILLECTOMY      SOCIAL HISTORY: Social History   Socioeconomic History  . Marital status: Married    Spouse name: Not on file    . Number of children: Not on file  . Years of education: Not on file  . Highest education level: Not on file  Occupational History  . Not on file  Tobacco Use  . Smoking status: Never Smoker  . Smokeless tobacco: Never Used  Substance and Sexual Activity  . Alcohol use: No    Alcohol/week: 0.0 standard drinks  . Drug use: No  . Sexual activity: Not on file  Other Topics Concern  . Not on file  Social History Narrative  . Not on file   Social Determinants of Health   Financial Resource Strain:   . Difficulty of Paying Living Expenses: Not on file  Food Insecurity:   . Worried About Charity fundraiser in the Last Year: Not on file  . Ran Out of Food in the Last Year: Not on file  Transportation Needs:   . Lack of Transportation (Medical): Not on file  . Lack of Transportation (Non-Medical): Not on file  Physical Activity:   . Days of Exercise per Week: Not on file  . Minutes of Exercise per Session: Not on file  Stress:   . Feeling of Stress : Not on file  Social Connections:   . Frequency of Communication with Friends and Family: Not on file  . Frequency of Social Gatherings with Friends and Family: Not on file  . Attends Religious Services: Not on file  .  Active Member of Clubs or Organizations: Not on file  . Attends Archivist Meetings: Not on file  . Marital Status: Not on file  Intimate Partner Violence:   . Fear of Current or Ex-Partner: Not on file  . Emotionally Abused: Not on file  . Physically Abused: Not on file  . Sexually Abused: Not on file    FAMILY HISTORY: Family History  Problem Relation Age of Onset  . COPD Father   . Heart attack Father   . Heart attack Mother     ALLERGIES:  is allergic to boniva [ibandronic acid] and cortisone.  MEDICATIONS:  Current Outpatient Medications  Medication Sig Dispense Refill  . gabapentin (NEURONTIN) 100 MG capsule Take 100 mg by mouth 2 (two) times daily before a meal.    . rosuvastatin  (CRESTOR) 20 MG tablet Take 20 mg by mouth daily.    . traMADol (ULTRAM) 50 MG tablet Take by mouth every 6 (six) hours as needed.     No current facility-administered medications for this visit.    REVIEW OF SYSTEMS:   Constitutional: Denies fevers, chills or abnormal night sweats Eyes: Denies blurriness of vision, double vision or watery eyes Ears, nose, mouth, throat, and face: Denies mucositis or sore throat Respiratory: Denies cough, dyspnea or wheezes Cardiovascular: Denies palpitation, chest discomfort or lower extremity swelling Gastrointestinal:  Denies nausea, heartburn or change in bowel habits Skin: Denies abnormal skin rashes Lymphatics: Denies new lymphadenopathy or easy bruising Neurological:Denies numbness, tingling or new weaknesses Behavioral/Psych: Mood is stable, no new changes  Breast: Denies any palpable lumps or discharge All other systems were reviewed with the patient and are negative.  PHYSICAL EXAMINATION: ECOG PERFORMANCE STATUS: 1 - Symptomatic but completely ambulatory  Vitals:   03/15/19 1537  BP: (!) 158/80  Pulse: 76  Resp: 20  Temp: 98.2 F (36.8 C)  SpO2: 97%   Filed Weights   03/15/19 1537  Weight: 140 lb 6.4 oz (63.7 kg)    GENERAL:alert, no distress and comfortable SKIN: skin color, texture, turgor are normal, no rashes or significant lesions EYES: normal, conjunctiva are pink and non-injected, sclera clear OROPHARYNX:no exudate, no erythema and lips, buccal mucosa, and tongue normal  NECK: supple, thyroid normal size, non-tender, without nodularity LYMPH:  no palpable lymphadenopathy in the cervical, axillary or inguinal LUNGS: clear to auscultation and percussion with normal breathing effort HEART: regular rate & rhythm and no murmurs and no lower extremity edema ABDOMEN:abdomen soft, non-tender and normal bowel sounds Musculoskeletal:no cyanosis of digits and no clubbing  PSYCH: alert & oriented x 3 with fluent speech NEURO: no  focal motor/sensory deficits  LABORATORY DATA:  I have reviewed the data as listed No results found for: WBC, HGB, HCT, MCV, PLT No results found for: NA, K, CL, CO2  RADIOGRAPHIC STUDIES: I have personally reviewed the radiological reports and agreed with the findings in the report.  ASSESSMENT AND PLAN:  Recurrent bilateral lower extremity DVTs: Each of these episodes were following a immobilization event and therefore surgery is a major risk factor for her.  However explained to her about thrombosis threshold model where usually there is more than 1 risk factor for blood clots. We would like to perform hypercoagulability work-up to help her understand her baseline risks.  Some of these risk factors cannot be mitigated.  Perioperative care for knee surgery: I instructed her to stop Xarelto 72 hours prior to surgery.  She will start Lovenox 1 mg/kg subcu twice daily (60 mg  dose) for 3 days.  She will then have surgery and then day after surgery she can resume Xarelto. I discussed with her that the standard dose of Xarelto is 20 mg and hence I will send her a new prescription for that dosage.  She does not think there was any problem tolerating the 20 mg dose.  I sent the prescription for Lovenox as well. I will touch base with her in 1 week for a MyChart virtual visit to go over the results of the hypercoagulability work-up.  Irrespective of the lab results she will remain on anticoagulation for life.  All questions were answered. The patient knows to call the clinic with any problems, questions or concerns.   Rulon Eisenmenger, MD, MPH 03/15/2019    I, Molly Dorshimer, am acting as scribe for Nicholas Lose, MD.  I have reviewed the above documentation for accuracy and completeness, and I agree with the above.

## 2019-03-15 ENCOUNTER — Other Ambulatory Visit: Payer: Self-pay

## 2019-03-15 ENCOUNTER — Inpatient Hospital Stay: Payer: Medicare HMO

## 2019-03-15 ENCOUNTER — Inpatient Hospital Stay: Payer: Medicare HMO | Attending: Hematology and Oncology | Admitting: Hematology and Oncology

## 2019-03-15 VITALS — BP 158/80 | HR 76 | Temp 98.2°F | Resp 20 | Ht 60.0 in | Wt 140.4 lb

## 2019-03-15 DIAGNOSIS — Z86718 Personal history of other venous thrombosis and embolism: Secondary | ICD-10-CM | POA: Diagnosis not present

## 2019-03-15 DIAGNOSIS — Z01818 Encounter for other preprocedural examination: Secondary | ICD-10-CM | POA: Diagnosis not present

## 2019-03-15 DIAGNOSIS — E782 Mixed hyperlipidemia: Secondary | ICD-10-CM | POA: Insufficient documentation

## 2019-03-15 DIAGNOSIS — I82403 Acute embolism and thrombosis of unspecified deep veins of lower extremity, bilateral: Secondary | ICD-10-CM

## 2019-03-15 DIAGNOSIS — Z7901 Long term (current) use of anticoagulants: Secondary | ICD-10-CM | POA: Insufficient documentation

## 2019-03-15 HISTORY — PX: MENISCUS REPAIR: SHX5179

## 2019-03-15 MED ORDER — ENOXAPARIN SODIUM 60 MG/0.6ML ~~LOC~~ SOLN: 60.0000 mg | Freq: Two times a day (BID) | SUBCUTANEOUS | 0 refills | Status: DC

## 2019-03-15 MED ORDER — RIVAROXABAN 20 MG PO TABS: 20.0000 mg | ORAL_TABLET | Freq: Every day | ORAL | 11 refills | Status: DC

## 2019-03-16 ENCOUNTER — Telehealth: Payer: Self-pay | Admitting: Hematology and Oncology

## 2019-03-16 DIAGNOSIS — R69 Illness, unspecified: Secondary | ICD-10-CM | POA: Diagnosis not present

## 2019-03-16 LAB — LUPUS ANTICOAGULANT PANEL
DRVVT: 36.7 s (ref 0.0–47.0)
PTT Lupus Anticoagulant: 31 s (ref 0.0–51.9)

## 2019-03-16 LAB — PROTEIN S, TOTAL: Protein S Ag, Total: 104 % (ref 60–150)

## 2019-03-16 LAB — ANTITHROMBIN III ANTIGEN: AT III AG PPP IMM-ACNC: 92 % (ref 72–124)

## 2019-03-16 NOTE — Telephone Encounter (Signed)
I talk with patient regarding video visit 2/8

## 2019-03-17 LAB — PROTEIN C, TOTAL: Protein C, Total: 111 % (ref 60–150)

## 2019-03-18 LAB — PROTHROMBIN GENE MUTATION

## 2019-03-18 LAB — FACTOR 5 LEIDEN

## 2019-03-22 ENCOUNTER — Inpatient Hospital Stay (HOSPITAL_BASED_OUTPATIENT_CLINIC_OR_DEPARTMENT_OTHER): Payer: Medicare HMO | Admitting: Hematology and Oncology

## 2019-03-22 DIAGNOSIS — I824Z3 Acute embolism and thrombosis of unspecified deep veins of distal lower extremity, bilateral: Secondary | ICD-10-CM | POA: Diagnosis not present

## 2019-03-22 DIAGNOSIS — I82409 Acute embolism and thrombosis of unspecified deep veins of unspecified lower extremity: Secondary | ICD-10-CM | POA: Insufficient documentation

## 2019-03-22 NOTE — Assessment & Plan Note (Signed)
Recurrent bilateral lower extremity DVTs: Each of these episodes were following a immobilization event and therefore surgery is a major risk factor for her. 2016: First blood clot from the sledgehammer fell on the leg right leg DVT took Xarelto 6 months 2018: Status post hernia surgery right leg DVT: Took Xarelto for 1 year 2019: Left leg vein stripping: Left leg DVT: Currently on Xarelto Patient needs left knee meniscus surgery.  Hypercoagulability work-up 03/15/2019: Negative for lupus anticoagulant.  Also negative for factor V Leiden, prothrombin gene mutation, protein C, S, AT-III  I explained to the patient there is no other risk factor identified for her blood clots.  However given the recurrent episodes of DVT, she qualifies for lifelong anticoagulation.  Perioperative counseling: Previously provided her detailed instructions on perioperative management of knee surgery. Return to clinic on an as-needed basis.

## 2019-03-22 NOTE — Progress Notes (Signed)
  HEMATOLOGY-ONCOLOGY TELEPHONE VISIT PROGRESS NOTE  I connected with Lisa Chapman on 03/22/2019 at  1:45 PM EST by telephone conference and verified that I am speaking with the correct person using two identifiers.  I discussed the limitations, risks, security and privacy concerns of performing an evaluation and management service by Telephone and the availability of in person appointments.  I also discussed with the patient that there may be a patient responsible charge related to this service. The patient expressed understanding and agreed to proceed.  Patient's Location: Home Physician Location: Clinic  CHIEF COMPLIANT: Follow-up to review labs.   INTERVAL HISTORY: Lisa Chapman is a 74 y.o. female with above-mentioned history of chronic bilateral lower extremity DVTs, currently on treatment with Xarelto 10mg  daily.  She presents over MyChart today to discuss her labs.    Assessment Plan:  DVT (deep venous thrombosis) (HCC) Recurrent bilateral lower extremity DVTs: Each of these episodes were following a immobilization event and therefore surgery is a major risk factor for her. 2016: First blood clot from the sledgehammer fell on the leg right leg DVT took Xarelto 6 months 2018: Status post hernia surgery right leg DVT: Took Xarelto for 1 year 2019: Left leg vein stripping: Left leg DVT: Currently on Xarelto Patient needs left knee meniscus surgery.  Hypercoagulability work-up 03/15/2019: Negative for lupus anticoagulant.  Also negative for factor V Leiden, prothrombin gene mutation, protein C, S, AT-III  I explained to the patient there is no other risk factor identified for her blood clots.  However given the recurrent episodes of DVT, she qualifies for lifelong anticoagulation. We will send a message to her Orthopedic surgeon to proceed with surgery.  Perioperative counseling: Previously provided her detailed instructions on perioperative management of knee surgery. Return to clinic  on an as-needed basis.    I discussed the assessment and treatment plan with the patient. The patient was provided an opportunity to ask questions and all were answered. The patient agreed with the plan and demonstrated an understanding of the instructions. The patient was advised to call back or seek an in-person evaluation if the symptoms worsen or if the condition fails to improve as anticipated.   I provided 15 minutes of face-to-face MyChart video visit time during this encounter.    Rulon Eisenmenger, MD 03/22/2019   I, Molly Dorshimer, am acting as scribe for Nicholas Lose, MD.  I have reviewed the above documentation for accuracy and completeness, and I agree with the above.

## 2019-03-31 DIAGNOSIS — S83232A Complex tear of medial meniscus, current injury, left knee, initial encounter: Secondary | ICD-10-CM | POA: Diagnosis not present

## 2019-03-31 DIAGNOSIS — M2242 Chondromalacia patellae, left knee: Secondary | ICD-10-CM | POA: Diagnosis not present

## 2019-03-31 DIAGNOSIS — S83242A Other tear of medial meniscus, current injury, left knee, initial encounter: Secondary | ICD-10-CM | POA: Diagnosis not present

## 2019-03-31 DIAGNOSIS — M1712 Unilateral primary osteoarthritis, left knee: Secondary | ICD-10-CM | POA: Diagnosis not present

## 2019-03-31 DIAGNOSIS — Y999 Unspecified external cause status: Secondary | ICD-10-CM | POA: Diagnosis not present

## 2019-03-31 DIAGNOSIS — M948X6 Other specified disorders of cartilage, lower leg: Secondary | ICD-10-CM | POA: Diagnosis not present

## 2019-04-09 DIAGNOSIS — M25562 Pain in left knee: Secondary | ICD-10-CM | POA: Diagnosis not present

## 2019-04-14 DIAGNOSIS — M25562 Pain in left knee: Secondary | ICD-10-CM | POA: Diagnosis not present

## 2019-04-28 DIAGNOSIS — M25562 Pain in left knee: Secondary | ICD-10-CM | POA: Diagnosis not present

## 2019-05-05 DIAGNOSIS — M25562 Pain in left knee: Secondary | ICD-10-CM | POA: Diagnosis not present

## 2019-05-07 DIAGNOSIS — M25562 Pain in left knee: Secondary | ICD-10-CM | POA: Diagnosis not present

## 2019-05-12 DIAGNOSIS — M25562 Pain in left knee: Secondary | ICD-10-CM | POA: Diagnosis not present

## 2019-05-19 DIAGNOSIS — M25562 Pain in left knee: Secondary | ICD-10-CM | POA: Diagnosis not present

## 2019-05-21 DIAGNOSIS — M25562 Pain in left knee: Secondary | ICD-10-CM | POA: Diagnosis not present

## 2019-05-25 DIAGNOSIS — M25562 Pain in left knee: Secondary | ICD-10-CM | POA: Diagnosis not present

## 2019-06-01 DIAGNOSIS — M25562 Pain in left knee: Secondary | ICD-10-CM | POA: Diagnosis not present

## 2019-06-08 DIAGNOSIS — M25562 Pain in left knee: Secondary | ICD-10-CM | POA: Diagnosis not present

## 2019-06-15 DIAGNOSIS — M25562 Pain in left knee: Secondary | ICD-10-CM | POA: Diagnosis not present

## 2019-06-17 DIAGNOSIS — E7800 Pure hypercholesterolemia, unspecified: Secondary | ICD-10-CM | POA: Diagnosis not present

## 2019-06-17 DIAGNOSIS — I82409 Acute embolism and thrombosis of unspecified deep veins of unspecified lower extremity: Secondary | ICD-10-CM | POA: Diagnosis not present

## 2019-06-17 DIAGNOSIS — I10 Essential (primary) hypertension: Secondary | ICD-10-CM | POA: Diagnosis not present

## 2019-06-17 DIAGNOSIS — Z299 Encounter for prophylactic measures, unspecified: Secondary | ICD-10-CM | POA: Diagnosis not present

## 2019-06-17 DIAGNOSIS — Z79899 Other long term (current) drug therapy: Secondary | ICD-10-CM | POA: Diagnosis not present

## 2019-06-18 DIAGNOSIS — M25562 Pain in left knee: Secondary | ICD-10-CM | POA: Diagnosis not present

## 2019-06-22 DIAGNOSIS — M25562 Pain in left knee: Secondary | ICD-10-CM | POA: Diagnosis not present

## 2019-06-29 DIAGNOSIS — M25562 Pain in left knee: Secondary | ICD-10-CM | POA: Diagnosis not present

## 2019-07-06 DIAGNOSIS — M25562 Pain in left knee: Secondary | ICD-10-CM | POA: Diagnosis not present

## 2019-07-16 DIAGNOSIS — M25562 Pain in left knee: Secondary | ICD-10-CM | POA: Diagnosis not present

## 2019-08-13 DIAGNOSIS — M25561 Pain in right knee: Secondary | ICD-10-CM | POA: Diagnosis not present

## 2019-08-13 DIAGNOSIS — M25562 Pain in left knee: Secondary | ICD-10-CM | POA: Diagnosis not present

## 2019-08-19 DIAGNOSIS — Z0100 Encounter for examination of eyes and vision without abnormal findings: Secondary | ICD-10-CM | POA: Diagnosis not present

## 2019-08-20 DIAGNOSIS — I82409 Acute embolism and thrombosis of unspecified deep veins of unspecified lower extremity: Secondary | ICD-10-CM | POA: Diagnosis not present

## 2019-08-20 DIAGNOSIS — M79605 Pain in left leg: Secondary | ICD-10-CM | POA: Diagnosis not present

## 2019-08-20 DIAGNOSIS — Z87891 Personal history of nicotine dependence: Secondary | ICD-10-CM | POA: Diagnosis not present

## 2019-08-20 DIAGNOSIS — Z299 Encounter for prophylactic measures, unspecified: Secondary | ICD-10-CM | POA: Diagnosis not present

## 2019-08-20 DIAGNOSIS — M79604 Pain in right leg: Secondary | ICD-10-CM | POA: Diagnosis not present

## 2019-08-23 DIAGNOSIS — M47817 Spondylosis without myelopathy or radiculopathy, lumbosacral region: Secondary | ICD-10-CM | POA: Diagnosis not present

## 2019-08-23 DIAGNOSIS — M9973 Connective tissue and disc stenosis of intervertebral foramina of lumbar region: Secondary | ICD-10-CM | POA: Diagnosis not present

## 2019-08-23 DIAGNOSIS — I700 Atherosclerosis of aorta: Secondary | ICD-10-CM | POA: Diagnosis not present

## 2019-08-23 DIAGNOSIS — M545 Low back pain: Secondary | ICD-10-CM | POA: Diagnosis not present

## 2019-08-30 DIAGNOSIS — R52 Pain, unspecified: Secondary | ICD-10-CM | POA: Diagnosis not present

## 2019-08-30 DIAGNOSIS — Z8679 Personal history of other diseases of the circulatory system: Secondary | ICD-10-CM | POA: Diagnosis not present

## 2019-08-30 DIAGNOSIS — I83812 Varicose veins of left lower extremities with pain: Secondary | ICD-10-CM | POA: Diagnosis not present

## 2019-09-03 DIAGNOSIS — I82409 Acute embolism and thrombosis of unspecified deep veins of unspecified lower extremity: Secondary | ICD-10-CM | POA: Diagnosis not present

## 2019-09-03 DIAGNOSIS — E7800 Pure hypercholesterolemia, unspecified: Secondary | ICD-10-CM | POA: Diagnosis not present

## 2019-09-03 DIAGNOSIS — Z299 Encounter for prophylactic measures, unspecified: Secondary | ICD-10-CM | POA: Diagnosis not present

## 2019-09-03 DIAGNOSIS — M47817 Spondylosis without myelopathy or radiculopathy, lumbosacral region: Secondary | ICD-10-CM | POA: Diagnosis not present

## 2019-09-03 DIAGNOSIS — I10 Essential (primary) hypertension: Secondary | ICD-10-CM | POA: Diagnosis not present

## 2019-09-13 DIAGNOSIS — M545 Low back pain: Secondary | ICD-10-CM | POA: Diagnosis not present

## 2019-09-15 DIAGNOSIS — M545 Low back pain: Secondary | ICD-10-CM | POA: Diagnosis not present

## 2019-09-20 DIAGNOSIS — M545 Low back pain: Secondary | ICD-10-CM | POA: Diagnosis not present

## 2019-09-27 DIAGNOSIS — M545 Low back pain: Secondary | ICD-10-CM | POA: Diagnosis not present

## 2019-09-29 DIAGNOSIS — M545 Low back pain: Secondary | ICD-10-CM | POA: Diagnosis not present

## 2019-10-04 DIAGNOSIS — M545 Low back pain: Secondary | ICD-10-CM | POA: Diagnosis not present

## 2019-10-06 DIAGNOSIS — M545 Low back pain: Secondary | ICD-10-CM | POA: Diagnosis not present

## 2019-10-13 DIAGNOSIS — M545 Low back pain: Secondary | ICD-10-CM | POA: Diagnosis not present

## 2019-10-20 DIAGNOSIS — Z299 Encounter for prophylactic measures, unspecified: Secondary | ICD-10-CM | POA: Diagnosis not present

## 2019-10-20 DIAGNOSIS — M25562 Pain in left knee: Secondary | ICD-10-CM | POA: Diagnosis not present

## 2019-10-20 DIAGNOSIS — I10 Essential (primary) hypertension: Secondary | ICD-10-CM | POA: Diagnosis not present

## 2019-10-20 DIAGNOSIS — I82409 Acute embolism and thrombosis of unspecified deep veins of unspecified lower extremity: Secondary | ICD-10-CM | POA: Diagnosis not present

## 2019-10-20 DIAGNOSIS — E7800 Pure hypercholesterolemia, unspecified: Secondary | ICD-10-CM | POA: Diagnosis not present

## 2019-10-29 DIAGNOSIS — K429 Umbilical hernia without obstruction or gangrene: Secondary | ICD-10-CM | POA: Diagnosis not present

## 2019-10-29 DIAGNOSIS — I10 Essential (primary) hypertension: Secondary | ICD-10-CM | POA: Diagnosis not present

## 2019-10-29 DIAGNOSIS — K219 Gastro-esophageal reflux disease without esophagitis: Secondary | ICD-10-CM | POA: Diagnosis not present

## 2019-10-29 DIAGNOSIS — Z299 Encounter for prophylactic measures, unspecified: Secondary | ICD-10-CM | POA: Diagnosis not present

## 2019-10-29 DIAGNOSIS — R1033 Periumbilical pain: Secondary | ICD-10-CM | POA: Diagnosis not present

## 2019-11-11 DIAGNOSIS — R109 Unspecified abdominal pain: Secondary | ICD-10-CM | POA: Diagnosis not present

## 2019-11-25 DIAGNOSIS — Z7901 Long term (current) use of anticoagulants: Secondary | ICD-10-CM | POA: Diagnosis not present

## 2019-11-25 DIAGNOSIS — K429 Umbilical hernia without obstruction or gangrene: Secondary | ICD-10-CM | POA: Diagnosis not present

## 2019-11-25 DIAGNOSIS — Z86718 Personal history of other venous thrombosis and embolism: Secondary | ICD-10-CM | POA: Diagnosis not present

## 2019-12-02 DIAGNOSIS — R5383 Other fatigue: Secondary | ICD-10-CM | POA: Diagnosis not present

## 2019-12-02 DIAGNOSIS — Z1339 Encounter for screening examination for other mental health and behavioral disorders: Secondary | ICD-10-CM | POA: Diagnosis not present

## 2019-12-02 DIAGNOSIS — Z0000 Encounter for general adult medical examination without abnormal findings: Secondary | ICD-10-CM | POA: Diagnosis not present

## 2019-12-02 DIAGNOSIS — Z299 Encounter for prophylactic measures, unspecified: Secondary | ICD-10-CM | POA: Diagnosis not present

## 2019-12-02 DIAGNOSIS — Z1331 Encounter for screening for depression: Secondary | ICD-10-CM | POA: Diagnosis not present

## 2019-12-02 DIAGNOSIS — I10 Essential (primary) hypertension: Secondary | ICD-10-CM | POA: Diagnosis not present

## 2019-12-02 DIAGNOSIS — E7800 Pure hypercholesterolemia, unspecified: Secondary | ICD-10-CM | POA: Diagnosis not present

## 2019-12-02 DIAGNOSIS — Z7189 Other specified counseling: Secondary | ICD-10-CM | POA: Diagnosis not present

## 2019-12-13 DIAGNOSIS — R002 Palpitations: Secondary | ICD-10-CM | POA: Diagnosis not present

## 2019-12-13 DIAGNOSIS — R9431 Abnormal electrocardiogram [ECG] [EKG]: Secondary | ICD-10-CM | POA: Diagnosis not present

## 2019-12-13 DIAGNOSIS — Z87891 Personal history of nicotine dependence: Secondary | ICD-10-CM | POA: Diagnosis not present

## 2019-12-13 DIAGNOSIS — Z7901 Long term (current) use of anticoagulants: Secondary | ICD-10-CM | POA: Diagnosis not present

## 2019-12-13 DIAGNOSIS — Z79899 Other long term (current) drug therapy: Secondary | ICD-10-CM | POA: Diagnosis not present

## 2019-12-13 DIAGNOSIS — K420 Umbilical hernia with obstruction, without gangrene: Secondary | ICD-10-CM | POA: Diagnosis not present

## 2019-12-13 DIAGNOSIS — E785 Hyperlipidemia, unspecified: Secondary | ICD-10-CM | POA: Diagnosis not present

## 2019-12-13 DIAGNOSIS — I471 Supraventricular tachycardia: Secondary | ICD-10-CM | POA: Diagnosis not present

## 2019-12-13 DIAGNOSIS — K429 Umbilical hernia without obstruction or gangrene: Secondary | ICD-10-CM | POA: Diagnosis not present

## 2019-12-13 DIAGNOSIS — Z86718 Personal history of other venous thrombosis and embolism: Secondary | ICD-10-CM | POA: Diagnosis not present

## 2019-12-14 DIAGNOSIS — K429 Umbilical hernia without obstruction or gangrene: Secondary | ICD-10-CM | POA: Diagnosis not present

## 2019-12-14 DIAGNOSIS — Z20822 Contact with and (suspected) exposure to covid-19: Secondary | ICD-10-CM | POA: Diagnosis not present

## 2019-12-14 DIAGNOSIS — Z01812 Encounter for preprocedural laboratory examination: Secondary | ICD-10-CM | POA: Diagnosis not present

## 2019-12-15 DIAGNOSIS — Z01810 Encounter for preprocedural cardiovascular examination: Secondary | ICD-10-CM | POA: Diagnosis not present

## 2019-12-15 DIAGNOSIS — R0600 Dyspnea, unspecified: Secondary | ICD-10-CM | POA: Diagnosis not present

## 2019-12-15 DIAGNOSIS — R9431 Abnormal electrocardiogram [ECG] [EKG]: Secondary | ICD-10-CM | POA: Diagnosis not present

## 2019-12-15 DIAGNOSIS — R002 Palpitations: Secondary | ICD-10-CM | POA: Diagnosis not present

## 2019-12-15 DIAGNOSIS — Z01818 Encounter for other preprocedural examination: Secondary | ICD-10-CM | POA: Diagnosis not present

## 2019-12-15 DIAGNOSIS — K429 Umbilical hernia without obstruction or gangrene: Secondary | ICD-10-CM | POA: Diagnosis not present

## 2019-12-16 DIAGNOSIS — R9431 Abnormal electrocardiogram [ECG] [EKG]: Secondary | ICD-10-CM | POA: Diagnosis not present

## 2019-12-20 DIAGNOSIS — K430 Incisional hernia with obstruction, without gangrene: Secondary | ICD-10-CM | POA: Diagnosis not present

## 2019-12-20 DIAGNOSIS — K429 Umbilical hernia without obstruction or gangrene: Secondary | ICD-10-CM | POA: Diagnosis not present

## 2019-12-23 DIAGNOSIS — R002 Palpitations: Secondary | ICD-10-CM | POA: Diagnosis not present

## 2019-12-29 ENCOUNTER — Telehealth: Payer: Self-pay | Admitting: Internal Medicine

## 2019-12-29 NOTE — Telephone Encounter (Signed)
Follow Up:  Reference # [id number]    Need clarifications on directions for pt's Amiodarone 200 mg.Lisa Chapman

## 2019-12-29 NOTE — Telephone Encounter (Signed)
I returned call to [facility] and call was for a different patient

## 2020-01-13 DIAGNOSIS — K429 Umbilical hernia without obstruction or gangrene: Secondary | ICD-10-CM | POA: Diagnosis not present

## 2020-01-13 DIAGNOSIS — Z48815 Encounter for surgical aftercare following surgery on the digestive system: Secondary | ICD-10-CM | POA: Diagnosis not present

## 2020-01-31 DIAGNOSIS — Z299 Encounter for prophylactic measures, unspecified: Secondary | ICD-10-CM | POA: Diagnosis not present

## 2020-01-31 DIAGNOSIS — D6869 Other thrombophilia: Secondary | ICD-10-CM | POA: Diagnosis not present

## 2020-01-31 DIAGNOSIS — I10 Essential (primary) hypertension: Secondary | ICD-10-CM | POA: Diagnosis not present

## 2020-02-12 DIAGNOSIS — C801 Malignant (primary) neoplasm, unspecified: Secondary | ICD-10-CM

## 2020-02-12 HISTORY — DX: Malignant (primary) neoplasm, unspecified: C80.1

## 2020-03-09 ENCOUNTER — Other Ambulatory Visit: Payer: Self-pay | Admitting: Hematology and Oncology

## 2020-04-03 DIAGNOSIS — D492 Neoplasm of unspecified behavior of bone, soft tissue, and skin: Secondary | ICD-10-CM | POA: Diagnosis not present

## 2020-04-03 DIAGNOSIS — I700 Atherosclerosis of aorta: Secondary | ICD-10-CM | POA: Diagnosis not present

## 2020-04-03 DIAGNOSIS — I10 Essential (primary) hypertension: Secondary | ICD-10-CM | POA: Diagnosis not present

## 2020-04-03 DIAGNOSIS — Z299 Encounter for prophylactic measures, unspecified: Secondary | ICD-10-CM | POA: Diagnosis not present

## 2020-04-17 DIAGNOSIS — L570 Actinic keratosis: Secondary | ICD-10-CM | POA: Diagnosis not present

## 2020-04-17 DIAGNOSIS — I10 Essential (primary) hypertension: Secondary | ICD-10-CM | POA: Diagnosis not present

## 2020-04-17 DIAGNOSIS — Z299 Encounter for prophylactic measures, unspecified: Secondary | ICD-10-CM | POA: Diagnosis not present

## 2020-06-05 DIAGNOSIS — H524 Presbyopia: Secondary | ICD-10-CM | POA: Diagnosis not present

## 2020-06-05 DIAGNOSIS — H52229 Regular astigmatism, unspecified eye: Secondary | ICD-10-CM | POA: Diagnosis not present

## 2020-06-05 DIAGNOSIS — H5213 Myopia, bilateral: Secondary | ICD-10-CM | POA: Diagnosis not present

## 2020-06-05 DIAGNOSIS — H5203 Hypermetropia, bilateral: Secondary | ICD-10-CM | POA: Diagnosis not present

## 2020-07-31 DIAGNOSIS — Z299 Encounter for prophylactic measures, unspecified: Secondary | ICD-10-CM | POA: Diagnosis not present

## 2020-07-31 DIAGNOSIS — I82409 Acute embolism and thrombosis of unspecified deep veins of unspecified lower extremity: Secondary | ICD-10-CM | POA: Diagnosis not present

## 2020-07-31 DIAGNOSIS — D6869 Other thrombophilia: Secondary | ICD-10-CM | POA: Diagnosis not present

## 2020-07-31 DIAGNOSIS — D492 Neoplasm of unspecified behavior of bone, soft tissue, and skin: Secondary | ICD-10-CM | POA: Diagnosis not present

## 2020-07-31 DIAGNOSIS — I10 Essential (primary) hypertension: Secondary | ICD-10-CM | POA: Diagnosis not present

## 2020-09-04 DIAGNOSIS — Z0100 Encounter for examination of eyes and vision without abnormal findings: Secondary | ICD-10-CM | POA: Diagnosis not present

## 2020-09-18 DIAGNOSIS — N6311 Unspecified lump in the right breast, upper outer quadrant: Secondary | ICD-10-CM | POA: Diagnosis not present

## 2020-09-18 DIAGNOSIS — I700 Atherosclerosis of aorta: Secondary | ICD-10-CM | POA: Diagnosis not present

## 2020-09-18 DIAGNOSIS — D6869 Other thrombophilia: Secondary | ICD-10-CM | POA: Diagnosis not present

## 2020-09-18 DIAGNOSIS — Z299 Encounter for prophylactic measures, unspecified: Secondary | ICD-10-CM | POA: Diagnosis not present

## 2020-09-18 DIAGNOSIS — I10 Essential (primary) hypertension: Secondary | ICD-10-CM | POA: Diagnosis not present

## 2020-10-04 DIAGNOSIS — N6311 Unspecified lump in the right breast, upper outer quadrant: Secondary | ICD-10-CM | POA: Diagnosis not present

## 2020-10-04 DIAGNOSIS — R922 Inconclusive mammogram: Secondary | ICD-10-CM | POA: Diagnosis not present

## 2020-10-04 DIAGNOSIS — R9389 Abnormal findings on diagnostic imaging of other specified body structures: Secondary | ICD-10-CM | POA: Diagnosis not present

## 2020-10-11 DIAGNOSIS — N6311 Unspecified lump in the right breast, upper outer quadrant: Secondary | ICD-10-CM | POA: Diagnosis not present

## 2020-10-11 DIAGNOSIS — C50811 Malignant neoplasm of overlapping sites of right female breast: Secondary | ICD-10-CM | POA: Diagnosis not present

## 2020-10-11 DIAGNOSIS — R2231 Localized swelling, mass and lump, right upper limb: Secondary | ICD-10-CM | POA: Diagnosis not present

## 2020-10-11 DIAGNOSIS — C773 Secondary and unspecified malignant neoplasm of axilla and upper limb lymph nodes: Secondary | ICD-10-CM | POA: Diagnosis not present

## 2020-10-11 DIAGNOSIS — R590 Localized enlarged lymph nodes: Secondary | ICD-10-CM | POA: Diagnosis not present

## 2020-10-19 DIAGNOSIS — C50911 Malignant neoplasm of unspecified site of right female breast: Secondary | ICD-10-CM | POA: Diagnosis not present

## 2020-10-19 DIAGNOSIS — Z6827 Body mass index (BMI) 27.0-27.9, adult: Secondary | ICD-10-CM | POA: Diagnosis not present

## 2020-10-19 DIAGNOSIS — Z299 Encounter for prophylactic measures, unspecified: Secondary | ICD-10-CM | POA: Diagnosis not present

## 2020-10-19 DIAGNOSIS — Z789 Other specified health status: Secondary | ICD-10-CM | POA: Diagnosis not present

## 2020-10-19 DIAGNOSIS — I10 Essential (primary) hypertension: Secondary | ICD-10-CM | POA: Diagnosis not present

## 2020-10-19 DIAGNOSIS — I700 Atherosclerosis of aorta: Secondary | ICD-10-CM | POA: Diagnosis not present

## 2020-10-19 DIAGNOSIS — G4700 Insomnia, unspecified: Secondary | ICD-10-CM | POA: Diagnosis not present

## 2020-10-20 ENCOUNTER — Other Ambulatory Visit: Payer: Self-pay | Admitting: Surgery

## 2020-10-20 DIAGNOSIS — C50411 Malignant neoplasm of upper-outer quadrant of right female breast: Secondary | ICD-10-CM | POA: Diagnosis not present

## 2020-10-23 ENCOUNTER — Telehealth: Payer: Self-pay | Admitting: Hematology and Oncology

## 2020-10-23 ENCOUNTER — Other Ambulatory Visit: Payer: Self-pay | Admitting: *Deleted

## 2020-10-23 DIAGNOSIS — C50919 Malignant neoplasm of unspecified site of unspecified female breast: Secondary | ICD-10-CM

## 2020-10-23 NOTE — Telephone Encounter (Signed)
Scheduled appt per 9/12 referral. Pt's husband is aware of appt date and time.

## 2020-10-26 ENCOUNTER — Ambulatory Visit: Payer: Medicare HMO | Admitting: Hematology and Oncology

## 2020-10-26 NOTE — Pre-Procedure Instructions (Addendum)
Surgical Instructions    Your procedure is scheduled on Friday 11/03/20.   Report to [facility] Main Entrance "A" at 05:30 A.M., then check in with the Admitting office.  Call this number if you have problems the morning of surgery:  [phone]   If you have any questions prior to your surgery date call [phone]: Open Monday-Friday 8am-4pm    Remember:  Do not eat after midnight the night before your surgery  You may drink clear liquids until 04:30 A.M. the morning of your surgery.   Clear liquids allowed are: Water, Non-Citrus Juices (without pulp), Carbonated Beverages, Clear Tea, Black Coffee ONLY (NO MILK, CREAM OR POWDERED CREAMER of any kind), and Gatorade    Take these medicines the morning of surgery with A SIP OF WATER   rosuvastatin (CRESTOR)   acetaminophen (TYLENOL)- If needed  sodium chloride (OCEAN)- If needed     Please follow your surgeon's instructions regarding XARELTO. If you have not received instructions then you need to contact your surgeon's office for instructions.   As of today, STOP taking any Aspirin (unless otherwise instructed by your surgeon) Aleve, Naproxen, Ibuprofen, Motrin, Advil, Goody's, BC's, all herbal medications, fish oil, and all vitamins.          Do not wear jewelry or makeup Do not wear lotions, powders, perfumes/colognes, or deodorant. Do not shave 48 hours prior to surgery.  Men may shave face and neck. Do not bring valuables to the hospital. DO Not wear nail polish, gel polish, artificial nails, or any other type of covering on natural nails including finger and toenails. If patients have artificial nails, gel coating, etc. that need to be removed by a nail salon please have this removed prior to surgery or surgery may need to be canceled/delayed if the surgeon/ anesthesia feels like the patient is unable to be adequately monitored.             [facility] is not responsible for any belongings or valuables.  Do NOT Smoke  (Tobacco/Vaping)  24 hours prior to your procedure If you use a CPAP at night, you may bring your mask for your overnight stay.   Contacts, glasses, dentures or bridgework may not be worn into surgery, please bring cases for these belongings   For patients admitted to the hospital, discharge time will be determined by your treatment team.   Patients discharged the day of surgery will not be allowed to drive home, and someone needs to stay with them for 24 hours.  NO VISITORS WILL BE ALLOWED IN PRE-OP WHERE PATIENTS GET READY FOR SURGERY.  ONLY 1 SUPPORT PERSON MAY BE PRESENT WHILE YOU ARE IN SURGERY.  IF YOU ARE TO BE ADMITTED, ONCE YOU ARE IN YOUR ROOM YOU WILL BE ALLOWED TWO (2) VISITORS.  Minor children may have two parents present. Special consideration for safety and communication needs will be reviewed on a case by case basis.  Special instructions:    Oral Hygiene is also important to reduce your risk of infection.  Remember - BRUSH YOUR TEETH THE MORNING OF SURGERY WITH YOUR REGULAR TOOTHPASTE   [facility]- Preparing For Surgery  Before surgery, you can play an important role. Because skin is not sterile, your skin needs to be as free of germs as possible. You can reduce the number of germs on your skin by washing with CHG (chlorahexidine gluconate) Soap before surgery.  CHG is an antiseptic cleaner which kills germs and bonds with the skin to  continue killing germs even after washing.     Please do not use if you have an allergy to CHG or antibacterial soaps. If your skin becomes reddened/irritated stop using the CHG.  Do not shave (including legs and underarms) for at least 48 hours prior to first CHG shower. It is OK to shave your face.  Please follow these instructions carefully.     Shower the NIGHT BEFORE SURGERY and the MORNING OF SURGERY with CHG Soap.   If you chose to wash your hair, wash your hair first as usual with your normal shampoo. After you shampoo, rinse your  hair and body thoroughly to remove the shampoo.  Then [employer] and genitals (private parts) with your normal soap and rinse thoroughly to remove soap.  After that Use CHG Soap as you would any other liquid soap. You can apply CHG directly to the skin and wash gently with a scrungie or a clean washcloth.   Apply the CHG Soap to your body ONLY FROM THE NECK DOWN.  Do not use on open wounds or open sores. Avoid contact with your eyes, ears, mouth and genitals (private parts). Wash Face and genitals (private parts)  with your normal soap.   Wash thoroughly, paying special attention to the area where your surgery will be performed.  Thoroughly rinse your body with warm water from the neck down.  DO NOT shower/wash with your normal soap after using and rinsing off the CHG Soap.  Pat yourself dry with a CLEAN TOWEL.  Wear CLEAN PAJAMAS to bed the night before surgery  Place CLEAN SHEETS on your bed the night before your surgery  DO NOT SLEEP WITH PETS.   Day of Surgery:  Take a shower with CHG soap. Wear Clean/Comfortable clothing the morning of surgery Do not apply any deodorants/lotions.   Remember to brush your teeth WITH YOUR REGULAR TOOTHPASTE.   Please read over the following fact sheets that you were given.

## 2020-10-27 ENCOUNTER — Other Ambulatory Visit: Payer: Self-pay

## 2020-10-27 ENCOUNTER — Encounter (HOSPITAL_COMMUNITY)
Admission: RE | Admit: 2020-10-27 | Discharge: 2020-10-27 | Disposition: A | Discharge status: Home / Self Care | Payer: Medicare HMO | Source: Ambulatory Visit | Attending: Surgery | Admitting: Surgery

## 2020-10-27 ENCOUNTER — Encounter (HOSPITAL_COMMUNITY): Payer: Self-pay

## 2020-10-27 DIAGNOSIS — Z01818 Encounter for other preprocedural examination: Secondary | ICD-10-CM | POA: Insufficient documentation

## 2020-10-27 HISTORY — DX: Cardiac murmur, unspecified: R01.1

## 2020-10-27 HISTORY — DX: Cardiac arrhythmia, unspecified: I49.9

## 2020-10-27 LAB — CBC
HCT: 41.5 % (ref 36.0–46.0)
Hemoglobin: 12.8 g/dL (ref 12.0–15.0)
MCH: 26.9 pg (ref 26.0–34.0)
MCHC: 30.8 g/dL (ref 30.0–36.0)
MCV: 87.2 fL (ref 80.0–100.0)
Platelets: 252 10*3/uL (ref 150–400)
RBC: 4.76 MIL/uL (ref 3.87–5.11)
RDW: 14.7 % (ref 11.5–15.5)
WBC: 6.6 10*3/uL (ref 4.0–10.5)
nRBC: 0 % (ref 0.0–0.2)

## 2020-10-27 LAB — BASIC METABOLIC PANEL
Anion gap: 4 — ABNORMAL LOW (ref 5–15)
BUN: 9 mg/dL (ref 8–23)
CO2: 28 mmol/L (ref 22–32)
Calcium: 9.3 mg/dL (ref 8.9–10.3)
Chloride: 108 mmol/L (ref 98–111)
Creatinine, Ser: 0.61 mg/dL (ref 0.44–1.00)
GFR, Estimated: >60 mL/min (ref >60)
Glucose, Bld: 110 mg/dL — ABNORMAL HIGH (ref 70–99)
Potassium: 3.8 mmol/L (ref 3.5–5.1)
Sodium: 140 mmol/L (ref 135–145)

## 2020-10-27 NOTE — Progress Notes (Addendum)
PCP - Dr. Jerene Bears Cardiologist - denies  PPM/ICD - n/a Device Orders - n/a Rep Notified - n/a  Chest x-ray - n/a EKG - 10/27/20 Stress Test - denies ECHO - 03/10/14 Cardiac Cath - denies  Sleep Study - denies CPAP - n/a  Fasting Blood Sugar - n/a Checks Blood Sugar __  n/a __ times a day  Blood Thinner Instructions: Patient states she was instructed to stop taking Xarelto 2 days prior to surgery.  Aspirin Instructions: n/a  ERAS Protcol - Yes PRE-SURGERY Ensure or G2- n/a  COVID TEST- No. Ambulatory Surgery   Anesthesia review: Yes  Hx heart murmur and Paroxysmal supraventricular tachycardia   Patient denies shortness of breath, fever, cough and chest pain at PAT appointment   All instructions explained to the patient, with a verbal understanding of the material. Patient agrees to go over the instructions while at home for a better understanding. Patient also instructed to self quarantine after being tested for COVID-19. The opportunity to ask questions was provided.

## 2020-10-30 NOTE — Progress Notes (Signed)
[facility] NOTE  Patient Care Team: [name], MD as PCP - General (Internal Medicine) Mauro Kaufmann, RN as Oncology Nurse Navigator Rockwell Germany, RN as Oncology Nurse Navigator  CHIEF COMPLAINTS/PURPOSE OF CONSULTATION:  Newly diagnosed right breast cancer  HISTORY OF PRESENTING ILLNESS:  Lisa Chapman 75 y.o. female is here because of recent diagnosis of right breast cancer. She palpated a mass in her axilla. Diagnostic mammogram and [place] on 10/04/2020 showed a large 3.6 cm mass in the right breast as well as several enlarged lymph nodes. Biopsy on 10/11/2020 showed invasive ductal carcinoma with lymph nodes positive for cancer as well. She has been seen at the clinic previously for a history of DVTs with her last visit on 03/22/2019. She presents to the clinic today for initial evaluation and discussion of treatment options.   I reviewed her records extensively and collaborated the history with the patient.  SUMMARY OF ONCOLOGIC HISTORY: Oncology History  Malignant neoplasm of upper-outer quadrant of right breast in female, estrogen receptor positive ([facility])  10/04/2020 Initial Diagnosis   Palpable lump in the right breast, mammogram detected 3.6 cm mass with several axillary lymph nodes, biopsy revealed grade 1-2 IDC, lymph nodes positive for cancer, ER 40%, PR 0%, Ki-67 15%, HER2 positive 3+ by [facility]   10/31/2020 Cancer Staging   Staging form: Breast, AJCC 8th Edition - Clinical stage from 10/31/2020: Stage IIA (cT2, cN1, cM0, G2, ER+, PR-, HER2+) - Signed by Nicholas Lose, MD on 10/31/2020 Stage prefix: Initial diagnosis Histologic grading system: 3 grade system     MEDICAL HISTORY:  Past Medical History:  Diagnosis Date   Cancer ([place]) 2022   Breast Cancer   Diverticulosis    Dysrhythmia    Heart murmur    History of colonic polyps    History of DVT (deep vein thrombosis)    Right leg, April 2015   History of shingles    Mixed hyperlipidemia      SURGICAL HISTORY: Past Surgical History:  Procedure Laterality Date   HERNIA REPAIR     MENISCUS REPAIR Left 03/2019   TONSILLECTOMY      SOCIAL HISTORY: Social History   Socioeconomic History   Marital status: Married    Spouse name: Not on file   Number of children: Not on file   Years of education: Not on file   Highest education level: Not on file  Occupational History   Not on file  Tobacco Use   Smoking status: Never   Smokeless tobacco: Never  Vaping Use   Vaping Use: Never used  Substance and Sexual Activity   Alcohol use: No    Alcohol/week: 0.0 standard drinks   Drug use: No   Sexual activity: Not on file  Other Topics Concern   Not on file  Social History Narrative   Married. No children   Social Determinants of Radio broadcast assistant Strain: Not on file  Food Insecurity: Not on file  Transportation Needs: Not on file  Physical Activity: Not on file  Stress: Not on file  Social Connections: Not on file  Intimate Partner Violence: Not on file    FAMILY HISTORY: Family History  Problem Relation Age of Onset   Heart attack Mother    COPD Father    Heart attack Father    Emphysema Father     ALLERGIES:  is allergic to ibuprofen, other, boniva [ibandronic acid], and cortisone.  MEDICATIONS:  Current Outpatient Medications  Medication Sig Dispense Refill   acetaminophen (TYLENOL) 500 MG tablet Take 1,000 mg by mouth every 6 (six) hours as needed for mild pain.     rosuvastatin (CRESTOR) 20 MG tablet Take 20 mg by mouth daily.     sodium chloride (OCEAN) 0.65 % SOLN nasal spray Place 1 spray into both nostrils as needed for congestion.     XARELTO 20 MG TABS tablet TAKE 1 TABLET (20 MG TOTAL) BY MOUTH DAILY WITH SUPPER. (Patient taking differently: Take 20 mg by mouth daily with supper.) 30 tablet 11   No current facility-administered medications for this visit.    REVIEW OF SYSTEMS:   Constitutional: Denies fevers, chills or abnormal  night sweats Eyes: Denies blurriness of vision, double vision or watery eyes Ears, nose, mouth, throat, and face: Denies mucositis or sore throat Respiratory: Denies cough, dyspnea or wheezes Cardiovascular: Denies palpitation, chest discomfort or lower extremity swelling Gastrointestinal:  Denies nausea, heartburn or change in bowel habits Skin: Denies abnormal skin rashes Lymphatics: Denies new lymphadenopathy or easy bruising Neurological:Denies numbness, tingling or new weaknesses Behavioral/Psych: Mood is stable, no new changes  Breast: Palpable lump in the right axilla All other systems were reviewed with the patient and are negative.  PHYSICAL EXAMINATION: ECOG PERFORMANCE STATUS: 1 - Symptomatic but completely ambulatory  Vitals:   10/31/20 1249  BP: (!) 162/68  Pulse: 73  Resp: 18  Temp: 97.8 F (36.6 C)  SpO2: 95%   Filed Weights   10/31/20 1249  Weight: 151 lb 9.6 oz (68.8 kg)    GENERAL:alert, no distress and comfortable   BREAST: Palpable right axillary lymphadenopathy (exam performed in the presence of a chaperone)   LABORATORY DATA:  I have reviewed the data as listed Lab Results  Component Value Date   WBC 6.6 10/27/2020   HGB 12.8 10/27/2020   HCT 41.5 10/27/2020   MCV 87.2 10/27/2020   PLT 252 10/27/2020   Lab Results  Component Value Date   NA 140 10/27/2020   K 3.8 10/27/2020   CL 108 10/27/2020   CO2 28 10/27/2020    RADIOGRAPHIC STUDIES: I have personally reviewed the radiological reports and agreed with the findings in the report.  ASSESSMENT AND PLAN:  Malignant neoplasm of upper-outer quadrant of right breast in female, estrogen receptor positive ([facility]) Palpable lump in the right breast, mammogram detected 3.6 cm mass with several axillary lymph nodes, biopsy revealed grade 1-2 IDC, lymph nodes positive for cancer, ER 40%, PR 0%, Ki-67 15%, HER2 positive 3+ by [facility]  Pathology and radiology counseling: Discussed with the patient, the  details of pathology including the type of breast cancer,the clinical staging, the significance of ER, PR and HER-2/neu receptors and the implications for treatment. After reviewing the pathology in detail, we proceeded to discuss the different treatment options between surgery, radiation, chemotherapy, antiestrogen therapies.  Recommendation based on multidisciplinary tumor board: 1. Neoadjuvant chemotherapy with TCH Perjeta 6 cycles followed by Herceptin Perjeta maintenance versus Kadcyla maintenance (based on response to neoadjuvant chemo) for 1 year 2. Followed by breast conserving surgery with targeted node dissection 3. Followed by adjuvant radiation therapy   4.  Followed by antiestrogen therapy  5.  Followed back neratinib  Chemotherapy Counseling: I discussed the risks and benefits of chemotherapy including the risks of nausea/ vomiting, risk of infection from low WBC count, fatigue due to chemo or anemia, bruising or bleeding due to low platelets, mouth sores, loss/ change in taste and decreased appetite. Liver and  kidney function will be monitored through out chemotherapy as abnormalities in liver and kidney function may be a side effect of treatment. Cardiac dysfunction due to Herceptin and Perjeta and neuropathy risk from Taxotere were discussed in detail. Risk of permanent bone marrow dysfunction due to chemo were also discussed.  Plan: 1. Port placement 2. Echocardiogram 3. Chemotherapy class 4. Breast MRI  Return to clinic in 2 weeks to start chemotherapy.    All questions were answered. The patient knows to call the clinic with any problems, questions or concerns.   Rulon Eisenmenger, MD, MPH 10/31/2020    I, Thana Ates, am acting as scribe for Nicholas Lose, MD.  I have reviewed the above documentation for accuracy and completeness, and I agree with the above.

## 2020-10-30 NOTE — Anesthesia Preprocedure Evaluation (Addendum)
Anesthesia Evaluation  Patient identified by MRN, date of birth, ID band Patient awake    Reviewed: Allergy & Precautions, NPO status , Patient's Chart, lab work & pertinent test results  Airway Mallampati: III  TM Distance: >3 FB Neck ROM: Full    Dental  (+) Dental Advisory Given, Partial Upper, Teeth Intact,    Pulmonary neg pulmonary ROS,    Pulmonary exam normal breath sounds clear to auscultation       Cardiovascular + DVT (xarelto last dose 10/31/20)  Normal cardiovascular exam+ Valvular Problems/Murmurs (mild MR) MR  Rhythm:Regular Rate:Normal  Echo 2016: - Left ventricle: The cavity size was normal. Wall thickness was  normal. Systolic function was vigorous. The estimated ejection  fraction was in the range of 65% to 70%. Wall motion was normal;  there were no regional wall motion abnormalities. Left  ventricular diastolic function parameters were normal.  - Aortic valve: Mildly calcified annulus. Trileaflet; mildly  calcified leaflets.  - Mitral valve: Mildly thickened leaflets . There was mild  regurgitation.     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 Hypercoagulability work-up 03/15/2019: Negative for lupus anticoagulant. Also negative for factor V Leiden, prothrombin gene mutation, protein C, S, AT-III. She was advised to stay on anticoagulation life long given the recurrent episodes.    Anesthesia Other Findings Breast ca   Reproductive/Obstetrics negative OB ROS                             Anesthesia Physical Anesthesia Plan  ASA: 2  Anesthesia Plan: General   Post-op Pain Management:    Induction: Intravenous  PONV Risk Score and Plan: 3 and Ondansetron, Dexamethasone, Midazolam and Treatment may vary due to age or medical condition  Airway Management Planned: LMA  Additional Equipment: None  Intra-op Plan:   Post-operative Plan: Extubation in OR  Informed Consent: I have reviewed the patients History and Physical, chart, labs and discussed the procedure including the risks, benefits and alternatives for the proposed anesthesia with the patient or authorized representative who has indicated his/her understanding and acceptance.     Dental advisory given  Plan Discussed with: CRNA  Anesthesia Plan Comments: ( )       Anesthesia Quick Evaluation

## 2020-10-30 NOTE — Progress Notes (Signed)
Anesthesia Chart Review:  Patient noted to have abnormal EKG preop testing.  T wave versions concerning for possible ischemia.  Review of records in [place] shows she recently had similarly abnormal EKG November 2021 prior to undergoing umbilical hernia repair.  At that time, nuclear stress test was ordered to rule out ischemia.  Stress test was done 12/15/2019 and showed normal perfusion, normal LV function.  Full results below.  She subsequently underwent hernia repair [id number] without complication.  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 Hypercoagulability work-up 03/15/2019: Negative for lupus anticoagulant. Also negative for factor V Leiden, prothrombin gene mutation, protein C, S, AT-III. She was advised to stay on anticoagulation life long given the recurrent episodes.   Per surgery posting, patient to hold Xarelto 2 days prior to surgery.  Preop labs reviewed, unremarkable.  EKG 10/27/2020: Normal sinus rhythm. Rate 62. T wave abnormality, consider anterolateral ischemia  Nuclear stress 12/15/19: CONCLUSION: 1.) LIMITATIONS: None.  2.) MYOCARDIAL PERFUSION: Normal.  3.) LEFT VENTRICULAR EJECTION FRACTION: Normal.  4.) REGIONAL WALL MOTION: Normal.   Wynonia Musty [facility] Short Stay Center/Anesthesiology Phone [phone] 10/30/2020 3:20 PM

## 2020-10-31 ENCOUNTER — Encounter: Payer: Self-pay | Admitting: General Practice

## 2020-10-31 ENCOUNTER — Inpatient Hospital Stay: Payer: Medicare HMO | Attending: Hematology and Oncology | Admitting: Hematology and Oncology

## 2020-10-31 ENCOUNTER — Other Ambulatory Visit: Payer: Self-pay

## 2020-10-31 ENCOUNTER — Encounter: Payer: Self-pay | Admitting: *Deleted

## 2020-10-31 VITALS — BP 162/68 | HR 73 | Temp 97.8°F | Resp 18 | Ht 62.0 in | Wt 151.6 lb

## 2020-10-31 DIAGNOSIS — Z170 Estrogen receptor positive status [ER+]: Secondary | ICD-10-CM | POA: Diagnosis not present

## 2020-10-31 DIAGNOSIS — C50411 Malignant neoplasm of upper-outer quadrant of right female breast: Secondary | ICD-10-CM | POA: Insufficient documentation

## 2020-10-31 MED ORDER — PROCHLORPERAZINE MALEATE 10 MG PO TABS: 10.0000 mg | ORAL_TABLET | Freq: Four times a day (QID) | ORAL | 1 refills | Status: DC | PRN

## 2020-10-31 MED ORDER — DEXAMETHASONE 4 MG PO TABS: 4.0000 mg | ORAL_TABLET | Freq: Every day | ORAL | 0 refills | Status: DC

## 2020-10-31 MED ORDER — LIDOCAINE-PRILOCAINE 2.5-2.5 % EX CREA: TOPICAL_CREAM | CUTANEOUS | 3 refills | Status: DC

## 2020-10-31 MED ORDER — ONDANSETRON HCL 8 MG PO TABS: 8.0000 mg | ORAL_TABLET | Freq: Two times a day (BID) | ORAL | 1 refills | Status: DC | PRN

## 2020-10-31 NOTE — Progress Notes (Signed)
START ON PATHWAY REGIMEN - Breast ? ? ?  Cycle 1: A cycle is 21 days: ?    Pertuzumab  ?    Trastuzumab-xxxx  ?    Docetaxel  ?    Carboplatin  ?  Cycles 2 through 6: A cycle is every 21 days: ?    Pertuzumab  ?    Trastuzumab-xxxx  ?    Docetaxel  ?    Carboplatin  ? ?**Always confirm dose/schedule in your pharmacy ordering system** ? ?Patient Characteristics: ?Preoperative or Nonsurgical Candidate (Clinical Staging), Neoadjuvant Therapy followed by Surgery, Invasive Disease, Chemotherapy, HER2 Positive, ER Positive ?Therapeutic Status: Preoperative or Nonsurgical Candidate (Clinical Staging) ?AJCC M Category: cM0 ?AJCC Grade: G2 ?Breast Surgical Plan: Neoadjuvant Therapy followed by Surgery ?ER Status: Positive (+) ?AJCC 8 Stage Grouping: IIA ?HER2 Status: Positive (+) ?AJCC T Category: cT2 ?AJCC N Category: cN1 ?PR Status: Negative (-) ?Intent of Therapy: ?Curative Intent, Discussed with Patient ?

## 2020-10-31 NOTE — Assessment & Plan Note (Signed)
Palpable lump in the right breast, mammogram detected 3.6 cm mass with several axillary lymph nodes, biopsy revealed grade 1-2 IDC, lymph nodes positive for cancer, ER 40%, PR 0%, Ki-67 15%, HER2 positive 3+ by [facility]  Pathology and radiology counseling: Discussed with the patient, the details of pathology including the type of breast cancer,the clinical staging, the significance of ER, PR and HER-2/neu receptors and the implications for treatment. After reviewing the pathology in detail, we proceeded to discuss the different treatment options between surgery, radiation, chemotherapy, antiestrogen therapies.  Recommendation based on multidisciplinary tumor board: 1. Neoadjuvant chemotherapy with TCH Perjeta 6 cycles followed by Herceptin Perjeta maintenance versus Kadcyla maintenance (based on response to neoadjuvant chemo) for 1 year 2. Followed by breast conserving surgery if possible with sentinel lymph node study 3. Followed by adjuvant radiation therapy if patient had lumpectomy  Chemotherapy Counseling: I discussed the risks and benefits of chemotherapy including the risks of nausea/ vomiting, risk of infection from low WBC count, fatigue due to chemo or anemia, bruising or bleeding due to low platelets, mouth sores, loss/ change in taste and decreased appetite. Liver and kidney function will be monitored through out chemotherapy as abnormalities in liver and kidney function may be a side effect of treatment. Cardiac dysfunction due to Herceptin and Perjeta and neuropathy risk from Taxotere were discussed in detail. Risk of permanent bone marrow dysfunction due to chemo were also discussed.  Plan: 1. Port placement 2. Echocardiogram 3. Chemotherapy class 4. Breast MRI  Return to clinic in 2 weeks to start chemotherapy.

## 2020-10-31 NOTE — Progress Notes (Signed)
[facility] Spiritual Care Note  Lisa Chapman phoned for help with details about her appointment this afternoon. Helped her learn that it's at 1:00 with Dr Lindi Adie and how to use valet parking and check in at welcome desk. She was very appreciative of support and encouragement.   [place], [place], [facility] Pager [phone] Voicemail [phone]

## 2020-10-31 NOTE — Research (Unsigned)
Trial:  [id number] - TREATMENT OF REFRACTORY NAUSEA  Patient Lisa Chapman was identified by Dr. Lindi Adie as a potential candidate for the above listed study.  This Clinical Research Nurse met with Lisa Chapman, [id number], on 10/31/20 in a manner and location that ensures patient privacy to discuss participation in the above listed research study.  Patient is Unaccompanied.  A copy of the informed consent document and separate HIPAA Authorization was provided to the patient.  Patient reads, speaks, and understands [place].   Patient was provided with the business card of this Nurse and encouraged to contact the research team with any questions.  Approximately 10 minutes were spent with the patient reviewing the informed consent documents.  Patient was provided the option of taking informed consent documents home to review and was encouraged to review at their convenience with their support network, including other care providers. Patient took the consent documents home to review. [name], BSN, RN Clinical Research Nurse 10/31/2020 2:22 PM

## 2020-11-01 ENCOUNTER — Encounter: Payer: Self-pay | Admitting: *Deleted

## 2020-11-01 ENCOUNTER — Telehealth: Payer: Self-pay | Admitting: Hematology and Oncology

## 2020-11-01 ENCOUNTER — Telehealth: Payer: Self-pay | Admitting: *Deleted

## 2020-11-01 NOTE — Progress Notes (Signed)
Pharmacist Chemotherapy Monitoring - Initial Assessment    Anticipated start date: 11/08/20   The following has been reviewed per standard work regarding the patient's treatment regimen: The patient's diagnosis, treatment plan and drug doses, and organ/hematologic function Lab orders and baseline tests specific to treatment regimen  The treatment plan start date, drug sequencing, and pre-medications Prior authorization status  Patient's documented medication list, including drug-drug interaction screen and prescriptions for anti-emetics and supportive care specific to the treatment regimen The drug concentrations, fluid compatibility, administration routes, and timing of the medications to be used The patient's access for treatment and lifetime cumulative dose history, if applicable  The patient's medication allergies and previous infusion related reactions, if applicable   Changes made to treatment plan:  N/A  Follow up needed:  N/A, no echo at this time   Juanantonio Stolar K, [place], 11/01/2020  12:55 PM 9/28

## 2020-11-01 NOTE — Telephone Encounter (Signed)
Left message for patient to return my call in reference to upcoming appointment for tomorrow

## 2020-11-01 NOTE — Telephone Encounter (Signed)
Called and spoke to patient to give contact information and assess navigation needs.  Patient denies any questions or concerns at this time.  Contact information given and encouraged her to call should anything arise. Patient verbalized understanding.

## 2020-11-02 ENCOUNTER — Ambulatory Visit (HOSPITAL_COMMUNITY): Admission: RE | Admit: 2020-11-02 | Payer: Medicare HMO | Source: Ambulatory Visit

## 2020-11-02 NOTE — H&P (Signed)
REFERRING PHYSICIAN: [name], MD  PROVIDER: Beverlee Nims, MD  MRN: [id number] DOB: February 10, 1946   Subjective   Chief Complaint: Breast Cancer   History of Present Illness: Lisa Chapman is a 75 y.o. female who is seen as an office consultation at the request of Dr. Woody Seller for evaluation of Breast Cancer .   This is a 75 year old female referred here for recent diagnosis of right breast cancer. A little more than a month ago she felt a mass in her right axilla. She ended up undergoing a mammogram and ultrasound showing a large 3.6 cm mass in the right breast as well as several enlarged lymph nodes. The mass was biopsied along with the lymph node. This showed invasive ductal carcinoma with lymph node being positive for cancer as well. ER and PR status is currently pending. She is on blood thinning medication for history of DVTs. There is no family history of breast cancer. She has had no previous problems with her breast. She denies nipple discharge.  Review of Systems: A complete review of systems was obtained from the patient. I have reviewed this information and discussed as appropriate with the patient. See HPI as well for other ROS.  ROS   Medical History: Past Medical History:  Diagnosis Date   DVT (deep venous thrombosis) (CMS-HCC)   Patient Active Problem List  Diagnosis   Abdominal pain   Cardiac murmur   DVT (deep venous thrombosis) (CMS-HCC)   Dyslipidemia   Mixed hyperlipidemia   Paroxysmal supraventricular tachycardia (CMS-HCC)   Recurrent umbilical hernia   Ventral hernia without obstruction or gangrene   Past Surgical History:  Procedure Laterality Date   HERNIA REPAIR   Knee Surgery 03/2019    Allergies  Allergen Reactions   Cortisone Hives   Ibuprofen Hives  BUT HAS HAD NAPROXEN WITHOUT DIFFICULTY   Ibandronic Acid Other (See Comments)  Epigastric discomfort Epigastric discomfort   Current Outpatient Medications on File Prior to Visit   Medication Sig Dispense Refill   rivaroxaban (XARELTO) 20 mg tablet TAKE 1 TABLET (20 MG TOTAL) BY MOUTH DAILY WITH SUPPER.   rosuvastatin (CRESTOR) 20 MG tablet Take 20 mg by mouth at bedtime   No current facility-administered medications on file prior to visit.   History reviewed. No pertinent family history.   Social History   Tobacco Use  Smoking Status Former Smoker   Quit date: 1996   Years since quitting: 26.7  Smokeless Tobacco Never Used    Social History   Socioeconomic History   Marital status: Married  Tobacco Use   Smoking status: Former Smoker  Quit date: 1996  Years since quitting: 26.7   Smokeless tobacco: Never Used  Scientific laboratory technician Use: Never used  Substance and Sexual Activity   Alcohol use: Not Currently   Drug use: Never   Sexual activity: Never   Objective:   Vitals:  10/20/20 1019  BP: (!) 150/80  Pulse: 68  Temp: 36.7 C (98 F)  SpO2: 98%  Weight: 68.9 kg (151 lb 12.8 oz)  Height: 152.4 cm (5')   Body mass index is 29.65 kg/m.  Physical Exam   She appears well on exam  There is a large mass at the 9 o'clock position of the right breast which is almost 4 cm in size. It is mobile. There are several enlarged lymph nodes in her right axilla  Labs, Imaging and Diagnostic Testing: I have reviewed her mammograms and ultrasound as well as  pathology results  Assessment and Plan:  Diagnoses and all orders for this visit:  Malignant neoplasm of upper-outer quadrant of right female breast, unspecified estrogen receptor status (CMS-HCC) - Ambulatory Referral to Oncology-Medical    This is a patient with a large invasive ductal carcinoma of the right breast which is node positive as well. I have discussed this with the patient and her husband. I believe she will need neoadjuvant therapy given the size of the tumor and her nodal status. I discussed this with her and her husband in detail. We discussed breast cancer in detail and the  multiple treatment modalities. I will go ahead and schedule her for Port-A-Cath insertion and we will refer her urgently to the medical oncologist at [facility] long as she is seeing them in the past for her DVT. I discussed Port-A-Cath insertion in detail. We discussed the risk which includes but is not limited to bleeding, infection, injury to surrounding structures, pneumothorax, the need for further procedures, etc. Surgery be scheduled soon as possible.

## 2020-11-02 NOTE — Progress Notes (Signed)
The following biosimilar Kanjinti (trastuzumab-anns) has been selected for use in this patient.  Kennith Center, Pharm.D., CPP 11/02/2020@1 :19 PM

## 2020-11-03 ENCOUNTER — Ambulatory Visit (HOSPITAL_COMMUNITY): Payer: Medicare HMO | Admitting: Anesthesiology

## 2020-11-03 ENCOUNTER — Encounter (HOSPITAL_COMMUNITY): Payer: Self-pay | Admitting: Surgery

## 2020-11-03 ENCOUNTER — Ambulatory Visit (HOSPITAL_COMMUNITY): Payer: Medicare HMO | Admitting: Physician Assistant

## 2020-11-03 ENCOUNTER — Ambulatory Visit (HOSPITAL_COMMUNITY): Payer: Medicare HMO

## 2020-11-03 ENCOUNTER — Encounter (HOSPITAL_COMMUNITY)
Admission: RE | Disposition: A | Discharge status: Home / Self Care | Payer: Self-pay | Source: Ambulatory Visit | Attending: Surgery

## 2020-11-03 ENCOUNTER — Ambulatory Visit (HOSPITAL_COMMUNITY)
Admission: RE | Admit: 2020-11-03 | Discharge: 2020-11-03 | Disposition: A | Discharge status: Home / Self Care | Payer: Medicare HMO | Source: Ambulatory Visit | Attending: Surgery | Admitting: Surgery

## 2020-11-03 DIAGNOSIS — C773 Secondary and unspecified malignant neoplasm of axilla and upper limb lymph nodes: Secondary | ICD-10-CM | POA: Diagnosis not present

## 2020-11-03 DIAGNOSIS — Z87891 Personal history of nicotine dependence: Secondary | ICD-10-CM | POA: Diagnosis not present

## 2020-11-03 DIAGNOSIS — Z886 Allergy status to analgesic agent status: Secondary | ICD-10-CM | POA: Diagnosis not present

## 2020-11-03 DIAGNOSIS — Z86010 Personal history of colonic polyps: Secondary | ICD-10-CM | POA: Diagnosis not present

## 2020-11-03 DIAGNOSIS — Z888 Allergy status to other drugs, medicaments and biological substances status: Secondary | ICD-10-CM | POA: Diagnosis not present

## 2020-11-03 DIAGNOSIS — E782 Mixed hyperlipidemia: Secondary | ICD-10-CM | POA: Diagnosis not present

## 2020-11-03 DIAGNOSIS — C50411 Malignant neoplasm of upper-outer quadrant of right female breast: Secondary | ICD-10-CM | POA: Insufficient documentation

## 2020-11-03 DIAGNOSIS — I517 Cardiomegaly: Secondary | ICD-10-CM | POA: Diagnosis not present

## 2020-11-03 DIAGNOSIS — Z86718 Personal history of other venous thrombosis and embolism: Secondary | ICD-10-CM | POA: Diagnosis not present

## 2020-11-03 DIAGNOSIS — Z419 Encounter for procedure for purposes other than remedying health state, unspecified: Secondary | ICD-10-CM

## 2020-11-03 DIAGNOSIS — Z170 Estrogen receptor positive status [ER+]: Secondary | ICD-10-CM | POA: Diagnosis not present

## 2020-11-03 DIAGNOSIS — Z79899 Other long term (current) drug therapy: Secondary | ICD-10-CM | POA: Diagnosis not present

## 2020-11-03 DIAGNOSIS — Z452 Encounter for adjustment and management of vascular access device: Secondary | ICD-10-CM

## 2020-11-03 DIAGNOSIS — Z7901 Long term (current) use of anticoagulants: Secondary | ICD-10-CM | POA: Insufficient documentation

## 2020-11-03 HISTORY — PX: PORTACATH PLACEMENT: SHX2246

## 2020-11-03 SURGERY — INSERTION, TUNNELED CENTRAL VENOUS DEVICE, WITH PORT
Anesthesia: General | Site: Chest | Laterality: Left

## 2020-11-03 MED ORDER — HEPARIN 6000 UNIT IRRIGATION SOLUTION
Status: DC | PRN
  Administered 2020-11-03: 1

## 2020-11-03 MED ORDER — CHLORHEXIDINE GLUCONATE CLOTH 2 % EX PADS: 6.0000 | MEDICATED_PAD | Freq: Once | CUTANEOUS | Status: DC

## 2020-11-03 MED ORDER — BUPIVACAINE HCL (PF) 0.25 % IJ SOLN
INTRAMUSCULAR | Status: AC
  Filled 2020-11-03: qty 30

## 2020-11-03 MED ORDER — ONDANSETRON HCL 4 MG/2ML IJ SOLN
INTRAMUSCULAR | Status: DC | PRN
  Administered 2020-11-03: 4 mg via INTRAVENOUS

## 2020-11-03 MED ORDER — DEXAMETHASONE SODIUM PHOSPHATE 10 MG/ML IJ SOLN
INTRAMUSCULAR | Status: AC
  Filled 2020-11-03: qty 1

## 2020-11-03 MED ORDER — FENTANYL CITRATE (PF) 250 MCG/5ML IJ SOLN
INTRAMUSCULAR | Status: DC | PRN
  Administered 2020-11-03: 50 ug via INTRAVENOUS

## 2020-11-03 MED ORDER — ONDANSETRON HCL 4 MG/2ML IJ SOLN
INTRAMUSCULAR | Status: AC
  Filled 2020-11-03: qty 2

## 2020-11-03 MED ORDER — CEFAZOLIN SODIUM-DEXTROSE 2-4 GM/100ML-% IV SOLN
2.0000 g | INTRAVENOUS | Status: AC
  Administered 2020-11-03: 2 g via INTRAVENOUS
  Filled 2020-11-03: qty 100

## 2020-11-03 MED ORDER — LACTATED RINGERS IV SOLN: INTRAVENOUS | Status: DC

## 2020-11-03 MED ORDER — LIDOCAINE 2% (20 MG/ML) 5 ML SYRINGE
INTRAMUSCULAR | Status: DC | PRN
  Administered 2020-11-03: 60 mg via INTRAVENOUS

## 2020-11-03 MED ORDER — 0.9 % SODIUM CHLORIDE (POUR BTL) OPTIME
TOPICAL | Status: DC | PRN
  Administered 2020-11-03: 1000 mL

## 2020-11-03 MED ORDER — MIDAZOLAM HCL 2 MG/2ML IJ SOLN
INTRAMUSCULAR | Status: AC
  Filled 2020-11-03: qty 2

## 2020-11-03 MED ORDER — HEPARIN SOD (PORK) LOCK FLUSH 100 UNIT/ML IV SOLN
INTRAVENOUS | Status: DC | PRN
  Administered 2020-11-03: 500 [IU] via INTRAVENOUS

## 2020-11-03 MED ORDER — ONDANSETRON HCL 4 MG/2ML IJ SOLN: 4.0000 mg | Freq: Once | INTRAMUSCULAR | Status: DC | PRN

## 2020-11-03 MED ORDER — PROPOFOL 10 MG/ML IV BOLUS
INTRAVENOUS | Status: AC
  Filled 2020-11-03: qty 20

## 2020-11-03 MED ORDER — PROPOFOL 10 MG/ML IV BOLUS
INTRAVENOUS | Status: DC | PRN
  Administered 2020-11-03: 150 mg via INTRAVENOUS

## 2020-11-03 MED ORDER — LACTATED RINGERS IV SOLN: INTRAVENOUS | Status: DC | PRN

## 2020-11-03 MED ORDER — ORAL CARE MOUTH RINSE: 15.0000 mL | Freq: Once | OROMUCOSAL | Status: AC

## 2020-11-03 MED ORDER — ACETAMINOPHEN 500 MG PO TABS
1000.0000 mg | ORAL_TABLET | ORAL | Status: AC
  Administered 2020-11-03: 1000 mg via ORAL
  Filled 2020-11-03: qty 2

## 2020-11-03 MED ORDER — HEPARIN SOD (PORK) LOCK FLUSH 100 UNIT/ML IV SOLN
INTRAVENOUS | Status: AC
  Filled 2020-11-03: qty 5

## 2020-11-03 MED ORDER — DEXAMETHASONE SODIUM PHOSPHATE 10 MG/ML IJ SOLN
INTRAMUSCULAR | Status: DC | PRN
  Administered 2020-11-03: 10 mg via INTRAVENOUS

## 2020-11-03 MED ORDER — AMISULPRIDE (ANTIEMETIC) 5 MG/2ML IV SOLN: 10.0000 mg | Freq: Once | INTRAVENOUS | Status: DC | PRN

## 2020-11-03 MED ORDER — PHENYLEPHRINE 40 MCG/ML (10ML) SYRINGE FOR IV PUSH (FOR BLOOD PRESSURE SUPPORT)
PREFILLED_SYRINGE | INTRAVENOUS | Status: DC | PRN
  Administered 2020-11-03 (×2): 80 ug via INTRAVENOUS
  Administered 2020-11-03: 120 ug via INTRAVENOUS

## 2020-11-03 MED ORDER — CHLORHEXIDINE GLUCONATE 0.12 % MT SOLN
15.0000 mL | Freq: Once | OROMUCOSAL | Status: AC
  Administered 2020-11-03: 15 mL via OROMUCOSAL
  Filled 2020-11-03: qty 15

## 2020-11-03 MED ORDER — FENTANYL CITRATE (PF) 250 MCG/5ML IJ SOLN
INTRAMUSCULAR | Status: AC
  Filled 2020-11-03: qty 5

## 2020-11-03 MED ORDER — BUPIVACAINE HCL (PF) 0.25 % IJ SOLN
INTRAMUSCULAR | Status: DC | PRN
  Administered 2020-11-03: 7 mL

## 2020-11-03 MED ORDER — OXYCODONE HCL 5 MG/5ML PO SOLN: 5.0000 mg | Freq: Once | ORAL | Status: DC | PRN

## 2020-11-03 MED ORDER — HYDROMORPHONE HCL 1 MG/ML IJ SOLN: 0.2500 mg | INTRAMUSCULAR | Status: DC | PRN

## 2020-11-03 MED ORDER — LIDOCAINE HCL (PF) 2 % IJ SOLN
INTRAMUSCULAR | Status: AC
  Filled 2020-11-03: qty 5

## 2020-11-03 MED ORDER — ACETAMINOPHEN 500 MG PO TABS: 1000.0000 mg | ORAL_TABLET | Freq: Once | ORAL | Status: DC

## 2020-11-03 MED ORDER — OXYCODONE HCL 5 MG PO TABS
5.0000 mg | ORAL_TABLET | Freq: Once | ORAL | Status: DC | PRN
Start: 2020-11-03 — End: 2020-11-03

## 2020-11-03 MED ORDER — MIDAZOLAM HCL 5 MG/5ML IJ SOLN
INTRAMUSCULAR | Status: DC | PRN
  Administered 2020-11-03: 2 mg via INTRAVENOUS

## 2020-11-03 MED ORDER — PHENYLEPHRINE 40 MCG/ML (10ML) SYRINGE FOR IV PUSH (FOR BLOOD PRESSURE SUPPORT)
PREFILLED_SYRINGE | INTRAVENOUS | Status: AC
  Filled 2020-11-03: qty 10

## 2020-11-03 MED ORDER — TRAMADOL HCL 50 MG PO TABS: 50.0000 mg | ORAL_TABLET | Freq: Four times a day (QID) | ORAL | 0 refills | Status: DC | PRN

## 2020-11-03 MED ORDER — HEPARIN 6000 UNIT IRRIGATION SOLUTION
Status: AC
  Filled 2020-11-03: qty 500

## 2020-11-03 SURGICAL SUPPLY — 43 items
BAG COUNTER SPONGE SURGICOUNT (BAG) ×2 IMPLANT
BAG DECANTER FOR FLEXI CONT (MISCELLANEOUS) ×2 IMPLANT
CHLORAPREP W/TINT 26 (MISCELLANEOUS) ×2 IMPLANT
COVER SURGICAL LIGHT HANDLE (MISCELLANEOUS) ×2 IMPLANT
COVER TRANSDUCER ULTRASND GEL (DISPOSABLE) IMPLANT
DERMABOND ADVANCED (GAUZE/BANDAGES/DRESSINGS) ×1
DERMABOND ADVANCED .7 DNX12 (GAUZE/BANDAGES/DRESSINGS) ×1 IMPLANT
DRAPE C-ARM 42X120 X-RAY (DRAPES) ×2 IMPLANT
DRAPE CHEST BREAST 15X10 FENES (DRAPES) ×2 IMPLANT
DRSG TEGADERM 4X4.75 (GAUZE/BANDAGES/DRESSINGS) ×2 IMPLANT
ELECT CAUTERY BLADE 6.4 (BLADE) ×2 IMPLANT
ELECT REM PT RETURN 9FT ADLT (ELECTROSURGICAL) ×2
ELECTRODE REM PT RTRN 9FT ADLT (ELECTROSURGICAL) ×1 IMPLANT
GAUZE 4X4 16PLY ~~LOC~~+RFID DBL (SPONGE) ×2 IMPLANT
GAUZE SPONGE 2X2 8PLY STRL LF (GAUZE/BANDAGES/DRESSINGS) ×1 IMPLANT
GEL ULTRASOUND 20GR AQUASONIC (MISCELLANEOUS) IMPLANT
GLOVE SURG SIGNA 7.5 PF LTX (GLOVE) ×2 IMPLANT
GOWN STRL REUS W/ TWL LRG LVL3 (GOWN DISPOSABLE) ×1 IMPLANT
GOWN STRL REUS W/ TWL XL LVL3 (GOWN DISPOSABLE) ×1 IMPLANT
GOWN STRL REUS W/TWL LRG LVL3 (GOWN DISPOSABLE) ×1
GOWN STRL REUS W/TWL XL LVL3 (GOWN DISPOSABLE) ×1
INTRODUCER COOK 11FR (CATHETERS) IMPLANT
KIT BASIN OR (CUSTOM PROCEDURE TRAY) ×2 IMPLANT
KIT PORT POWER 8FR ISP CVUE (Port) ×2 IMPLANT
KIT TURNOVER KIT B (KITS) ×2 IMPLANT
NS IRRIG 1000ML POUR BTL (IV SOLUTION) ×2 IMPLANT
PAD ARMBOARD 7.5X6 YLW CONV (MISCELLANEOUS) ×2 IMPLANT
PENCIL BUTTON HOLSTER BLD 10FT (ELECTRODE) ×2 IMPLANT
POSITIONER HEAD DONUT 9IN (MISCELLANEOUS) ×2 IMPLANT
SET INTRODUCER 12FR PACEMAKER (INTRODUCER) IMPLANT
SET SHEATH INTRODUCER 10FR (MISCELLANEOUS) IMPLANT
SHEATH COOK PEEL AWAY SET 9F (SHEATH) IMPLANT
SPONGE GAUZE 2X2 STER 10/PKG (GAUZE/BANDAGES/DRESSINGS) ×1
SUT MNCRL AB 4-0 PS2 18 (SUTURE) ×2 IMPLANT
SUT PROLENE 2 0 SH 30 (SUTURE) ×2 IMPLANT
SUT SILK 2 0 (SUTURE)
SUT SILK 2-0 18XBRD TIE 12 (SUTURE) IMPLANT
SUT VIC AB 3-0 SH 27 (SUTURE) ×1
SUT VIC AB 3-0 SH 27XBRD (SUTURE) ×1 IMPLANT
SYR 5ML LUER SLIP (SYRINGE) ×2 IMPLANT
TOWEL GREEN STERILE (TOWEL DISPOSABLE) ×2 IMPLANT
TOWEL GREEN STERILE FF (TOWEL DISPOSABLE) ×2 IMPLANT
TRAY LAPAROSCOPIC MC (CUSTOM PROCEDURE TRAY) ×2 IMPLANT

## 2020-11-03 NOTE — Transfer of Care (Signed)
Immediate Anesthesia Transfer of Care Note  Patient: Lisa Chapman  Procedure(s) Performed: INSERTION PORT-A-CATH (Left: Chest)  Patient Location: PACU  Anesthesia Type:General  Level of Consciousness: oriented, drowsy and patient cooperative  Airway & Oxygen Therapy: Patient Spontanous Breathing and Patient connected to nasal cannula oxygen  Post-op Assessment: Report given to RN and Post -op Vital signs reviewed and stable  Post vital signs: Reviewed  Last Vitals:  Vitals Value Taken Time  BP    Temp 36.1 C 11/03/20 0810  Pulse    Resp    SpO2      Last Pain:  Vitals:   11/03/20 0638  TempSrc:   PainSc: 0-No pain         Complications: No notable events documented.

## 2020-11-03 NOTE — Anesthesia Procedure Notes (Signed)
Procedure Name: LMA Insertion Date/Time: 11/03/2020 7:33 AM Performed by: Jenne Campus, CRNA Pre-anesthesia Checklist: Patient identified, Emergency Drugs available, Suction available and Patient being monitored Patient Re-evaluated:Patient Re-evaluated prior to induction Oxygen Delivery Method: Circle System Utilized Preoxygenation: Pre-oxygenation with 100% oxygen Induction Type: IV induction Ventilation: Mask ventilation without difficulty LMA: LMA inserted LMA Size: 4.0 Number of attempts: 1 Airway Equipment and Method: Bite block Placement Confirmation: positive ETCO2 and breath sounds checked- equal and bilateral Tube secured with: Tape Dental Injury: Teeth and Oropharynx as per pre-operative assessment

## 2020-11-03 NOTE — Anesthesia Postprocedure Evaluation (Signed)
Anesthesia Post Note  Patient: Lisa Chapman  Procedure(s) Performed: INSERTION PORT-A-CATH (Left: Chest)     Patient location during evaluation: PACU Anesthesia Type: General Level of consciousness: awake and alert, oriented and patient cooperative Pain management: pain level controlled Vital Signs Assessment: post-procedure vital signs reviewed and stable Respiratory status: spontaneous breathing, nonlabored ventilation and respiratory function stable Cardiovascular status: blood pressure returned to baseline and stable Postop Assessment: no apparent nausea or vomiting Anesthetic complications: no   No notable events documented.  Last Vitals:  Vitals:   11/03/20 0825 11/03/20 0840  BP: (!) 143/78 (!) 151/82  Pulse: (!) 56 (!) 57  Resp: 18 18  Temp:  (!) 36.1 C  SpO2: 98% 95%    Last Pain:  Vitals:   11/03/20 0840  TempSrc:   PainSc: 0-No pain                 Pervis Hocking

## 2020-11-03 NOTE — Discharge Instructions (Signed)
Ok to shower starting tomorrow  Ice pack, tylenol, and ibuprofen also for pain  No vigorous activity for one week

## 2020-11-03 NOTE — Op Note (Signed)
INSERTION PORT-A-CATH  Procedure Note  Lisa Chapman 11/03/2020   Pre-op Diagnosis: BREAST CANCER     Post-op Diagnosis: same  Procedure(s): INSERTION PORT-A-CATH (Left subclavian vein, 8Fr)  Surgeon(s): Coralie Keens, MD  Anesthesia: General  Staff:  Circulator: Rozell Searing, RN; Melody Comas, RN Radiology Technologist: Richardean Sale I, RT Scrub Person: Rolan Bucco  Estimated Blood Loss: Minimal               Indications: This is a 75 year old female who presents with a right breast cancer which is lymph node positive.  The decision was made to proceed with neoadjuvant therapy.  Port-A-Cath is being inserted for IV chemotherapy  Procedure: The patient brought to operating identifies correct patient.  She is placed upon the operating table and general anesthesia was induced.  Her left chest and neck were prepped and draped in usual sterile fashion.  The patient was placed in Trendelenburg position.  I anesthetized skin the left chest and clavicle with Marcaine.  I then used the introducer needle to easily cannulate the left subclavian vein.  A wire was passed through the needle and into the central venous system under direct fluoroscopy.  The needle was then removed.  I then anesthetized skin further with Marcaine and made a longitudinal incision at the wire introduction site scalpel.  With the cautery I then created a pocket for the port.  An 8 French port was brought to the field and flushed along with the catheter.  I placed the venous dilator and introducer sheath over the wire and into the central venous system.  The wire and dilator were then removed.  I attached the catheter to the port and cut an appropriate length.  The port was placed into the pocket and then I fed the catheter down the peel-away sheath and into the central venous system as the sheath was peeled away.  I accessed the port and good flush and return were demonstrated with heparinized saline.   Fluoroscopy confirmed placement in the superior vena cava.  I then sutured the port to the chest wall with 3-0 Prolene sutures.  I again accessed the port and injected 5 cc of concentrated heparin solution.  The subtenons tissue was then closed with interrupted 3-0 Vicryl sutures and skin was closed with running 4-0 Monocryl.  Dermabond was then applied.  The patient tolerated the procedure well.  All the counts were correct at the end of the procedure.  The patient was then extubated in the operating room and taken in stable condition to the recovery room.          Coralie Keens   Date: 11/03/2020  Time: 8:02 AM

## 2020-11-03 NOTE — Interval H&P Note (Signed)
History and Physical Interval Note: no change in H and P  11/03/2020 6:57 AM  Lisa Chapman  has presented today for surgery, with the diagnosis of BREAST CANCER.  The various methods of treatment have been discussed with the patient and family. After consideration of risks, benefits and other options for treatment, the patient has consented to  Procedure(s): INSERTION PORT-A-CATH (N/A) as a surgical intervention.  The patient's history has been reviewed, patient examined, no change in status, stable for surgery.  I have reviewed the patient's chart and labs.  Questions were answered to the patient's satisfaction.     Coralie Keens

## 2020-11-06 ENCOUNTER — Encounter (HOSPITAL_COMMUNITY): Payer: Self-pay | Admitting: Surgery

## 2020-11-06 ENCOUNTER — Encounter: Payer: Self-pay | Admitting: *Deleted

## 2020-11-06 ENCOUNTER — Telehealth: Payer: Self-pay | Admitting: *Deleted

## 2020-11-06 ENCOUNTER — Ambulatory Visit
Admission: RE | Admit: 2020-11-06 | Discharge: 2020-11-06 | Disposition: A | Discharge status: Home / Self Care | Payer: Medicare HMO | Source: Ambulatory Visit | Attending: Hematology and Oncology | Admitting: Hematology and Oncology

## 2020-11-06 ENCOUNTER — Encounter: Payer: Self-pay | Admitting: Hematology and Oncology

## 2020-11-06 ENCOUNTER — Other Ambulatory Visit: Payer: Self-pay

## 2020-11-06 DIAGNOSIS — C50911 Malignant neoplasm of unspecified site of right female breast: Secondary | ICD-10-CM | POA: Diagnosis not present

## 2020-11-06 DIAGNOSIS — R590 Localized enlarged lymph nodes: Secondary | ICD-10-CM | POA: Diagnosis not present

## 2020-11-06 DIAGNOSIS — N6310 Unspecified lump in the right breast, unspecified quadrant: Secondary | ICD-10-CM | POA: Diagnosis not present

## 2020-11-06 DIAGNOSIS — Z170 Estrogen receptor positive status [ER+]: Secondary | ICD-10-CM

## 2020-11-06 IMAGING — MR MR BREAST BILAT WO/W CM
8 of 13 series · 32 of 48 positions shown · IV contrast (gadavist)
Comparison: Previous exam(s).

CLINICAL DATA: Patient with recently diagnosed right breast
carcinoma and metastatic right axillary lymphadenopathy, biopsies
performed on [DATE]. Assess for extent of disease. Patient
scheduled for neoadjuvant chemotherapy.

LABS:  No labs drawn at time of imaging
EXAM:
BILATERAL BREAST MRI WITH AND WITHOUT CONTRAST
TECHNIQUE: Multiplanar, multisequence MR images of both breasts were obtained
prior to and following the intravenous administration of 7 ml of
Gadavist

[Series 2: t2_tirm_tra ipat (a-p) · axial · 3.0mm · 0.78mm/px · 1 of 61 slices shown]
[im 1/61]
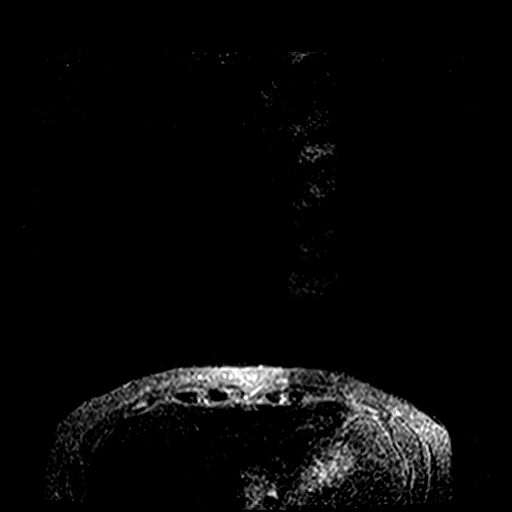

[Series 3: fl3d pre-cm no · axial · non-contrast · 1.2mm · 1.04mm/px · z∈[-77,+113]mm · 5 of 160 slices shown]
[im 1/160]
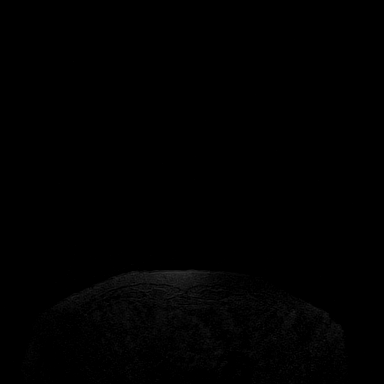
[im 40/160]
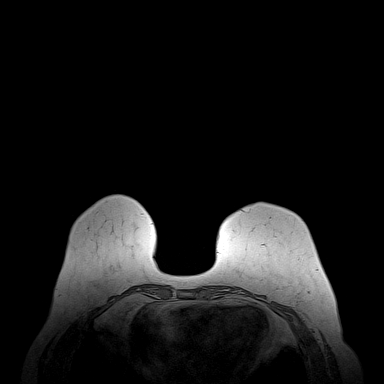
[im 80/160]
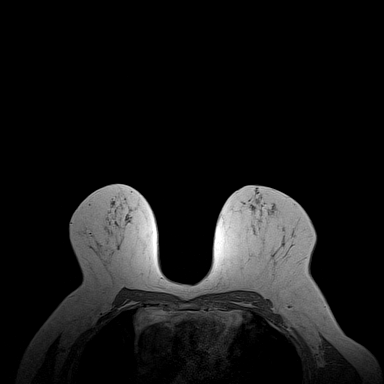
[im 120/160]
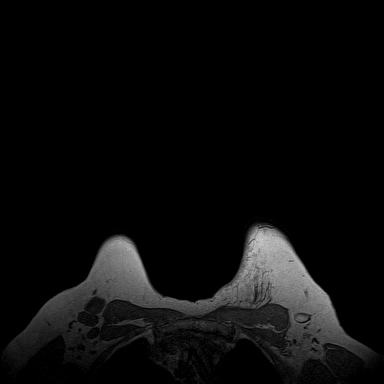
[im 160/160]
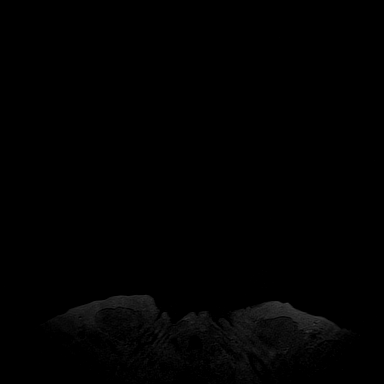

[Series 4: fl3d pre-cm · axial · non-contrast · 1.2mm · 1.04mm/px · z∈[-77,+113]mm · 5 of 160 slices shown]
[im 1/160]
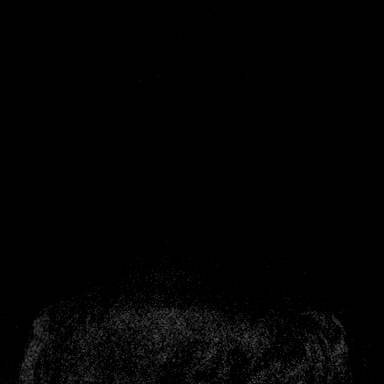
[im 40/160]
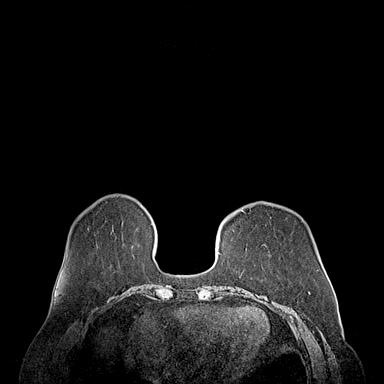
[im 80/160]
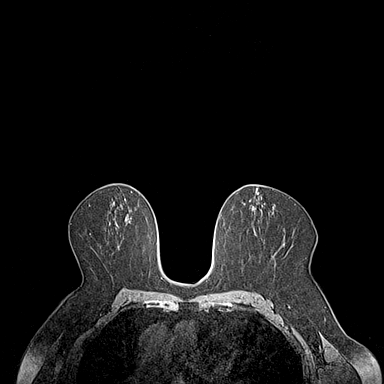
[im 120/160]
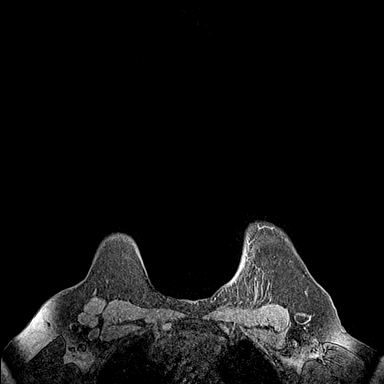
[im 160/160]
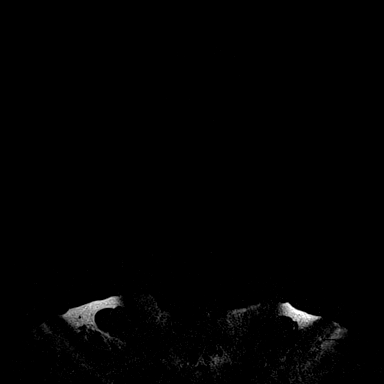

[Series 5: fl3d post-cm 20 · axial · 1.2mm · 1.04mm/px · z∈[-77,+113]mm · 5 of 160 slices shown (1 of 3)]
[im 1/160]
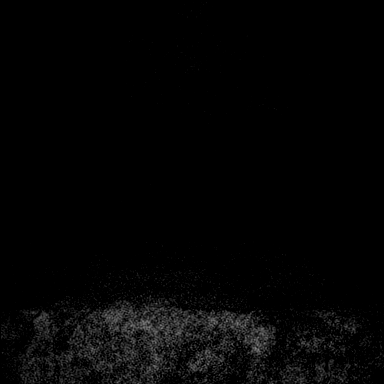
[im 40/160]
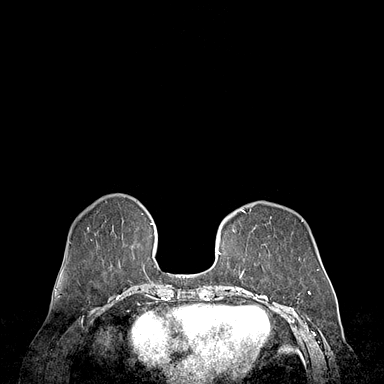
[im 80/160]
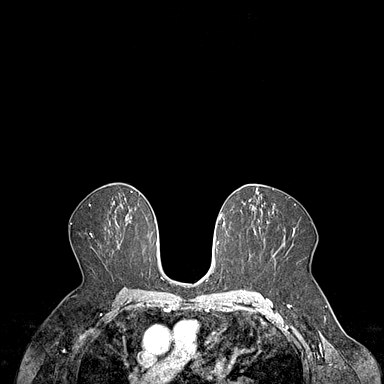
[im 120/160]
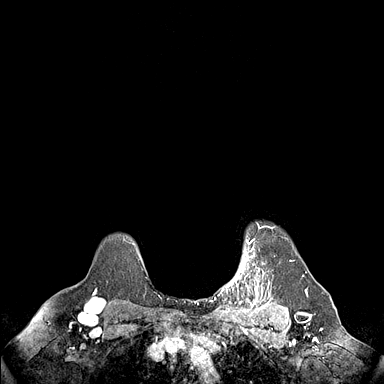
[im 160/160]
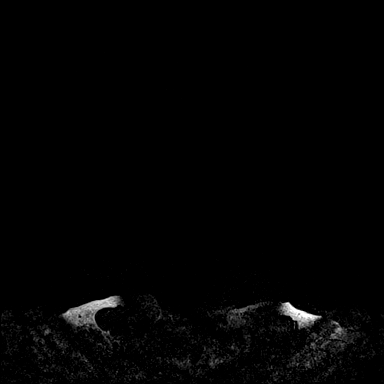

[Series 6: fl3d post-cm 20 · axial · 1.2mm · 1.04mm/px · z∈[-77,+113]mm · 5 of 160 slices shown (2 of 3)]
[im 1/160]
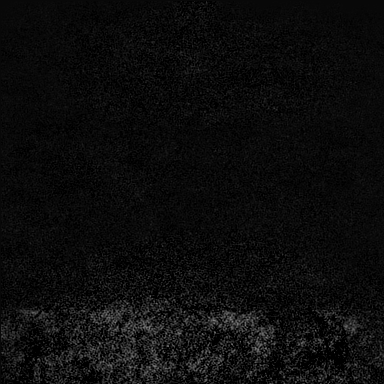
[im 40/160]
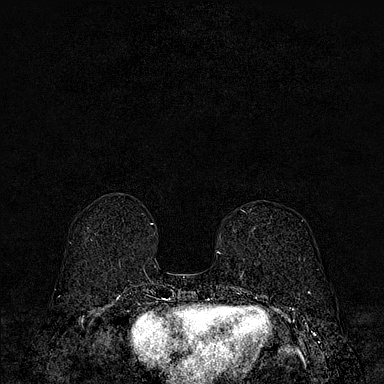
[im 80/160]
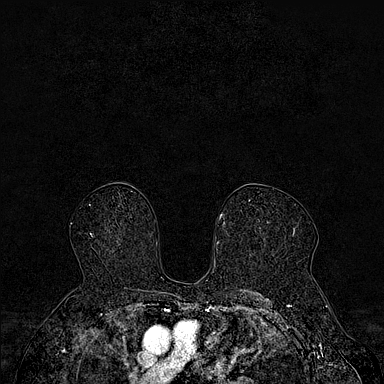
[im 120/160]
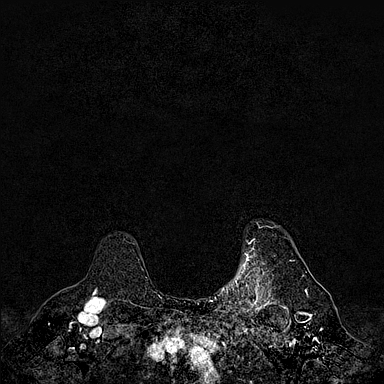
[im 160/160]
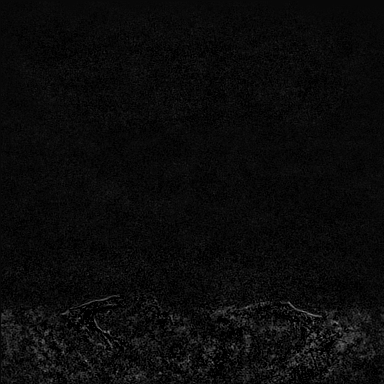

[Series 7: fl3d post-cm 20 · axial · 192.0mm · 1.04mm/px · 1 of 1 slices shown (3 of 3)]
[im 1/1]
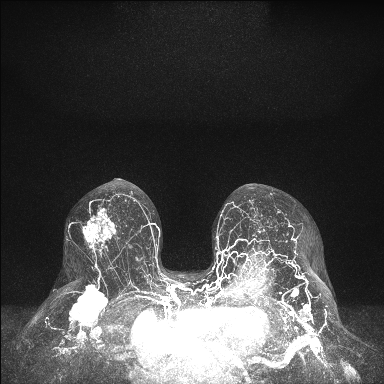

[Series 8: fl3d post-cm 3 · axial · 1.2mm · 1.04mm/px · z∈[-77,+113]mm · 5 of 160 slices shown (1 of 2)]
[im 1/160]
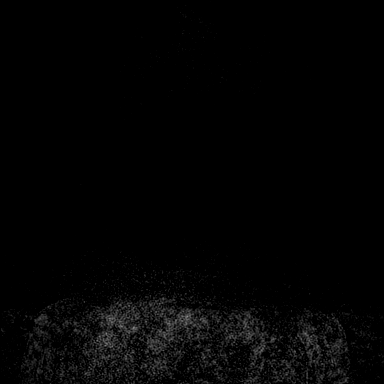
[im 40/160]
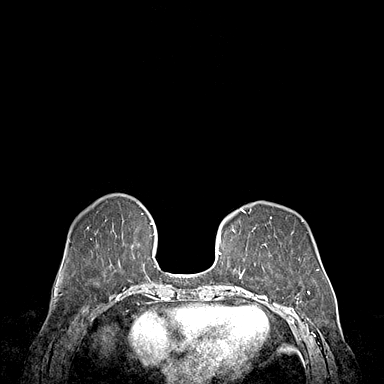
[im 80/160]
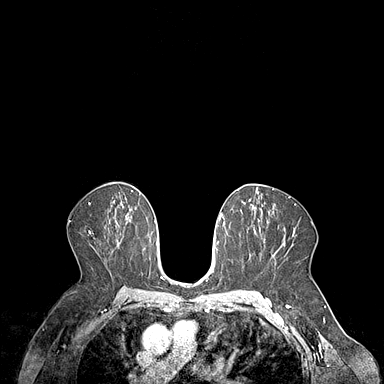
[im 120/160]
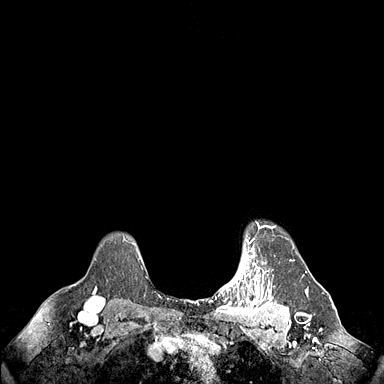
[im 160/160]
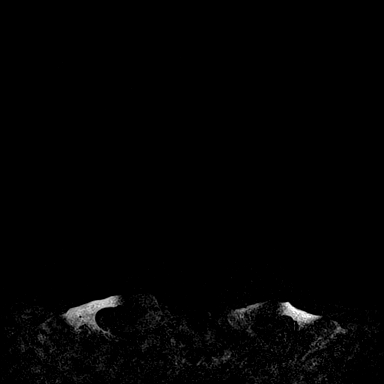

[Series 9: fl3d post-cm 3 · axial · 1.2mm · 1.04mm/px · z∈[-77,+75]mm · 5 of 160 slices shown (2 of 2)]
[im 1/160]
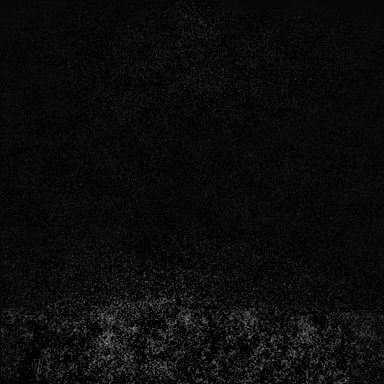
[im 32/160]
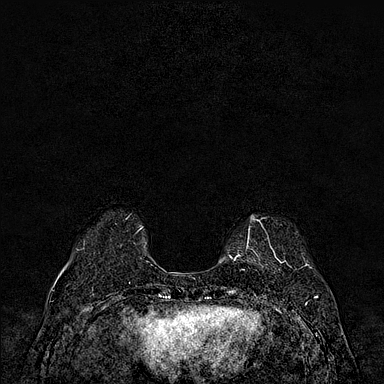
[im 64/160]
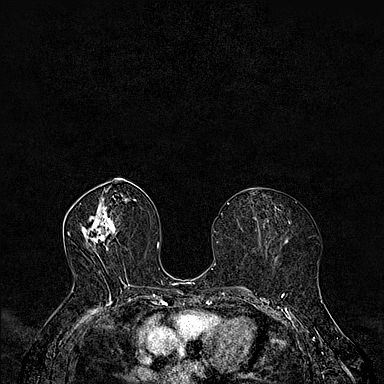
[im 96/160]
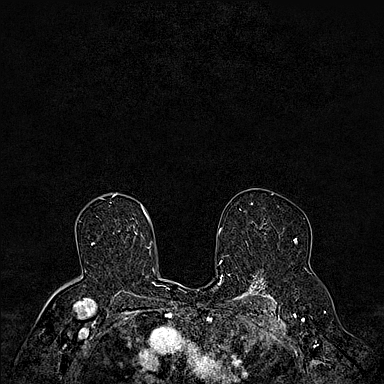
[im 128/160]
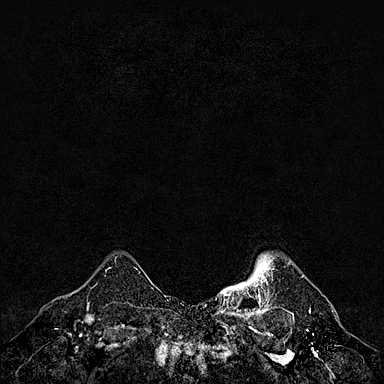

[32 of 48 positions shown; findings below may reference images not displayed]

Three-dimensional MR images were rendered by post-processing of the
original MR data on an independent workstation. The
three-dimensional MR images were interpreted, and findings are
reported in the following complete MRI report for this study. Three
dimensional images were evaluated at the independent interpreting
workstation using the DynaCAD thin client.
FINDINGS: Breast composition: b. Scattered fibroglandular tissue.

Background parenchymal enhancement: Minimal

Right breast: Irregular enhancing mass lies in the right breast,
laterally, middle depth, spanning 3.6 cm, anterior-posterior, by
cm superior to inferior, by 4.2 cm right to left. Mass contains a
central focus of susceptibility artifact reflecting the post biopsy
marker clip.

There are no other areas of abnormal right breast enhancement.

Left breast: No mass or abnormal enhancement.

Lymph nodes: 9 discrete abnormal right axillary lymph nodes are
noted on the right. The most anterior node contains peripheral
susceptibility artifact consistent with the post biopsy marker clip.
This node measures 1.8 cm in short axis.

No abnormal left axillary lymph nodes.

Ancillary findings: Left upper inner chest wall Port-A-Cath with
adjacent edema.
IMPRESSION: 1. Biopsy-proven right breast malignancy spans 3.6 x 2.4 x 4.2 cm,
centered in the lateral right breast, middle depth.
2. No evidence of additional areas of right breast malignancy.
3. No evidence of left breast malignancy.
4. 9 abnormal right axillary lymph nodes consistent with metastatic
disease.

RECOMMENDATION:
Treatment as planned for the known right breast malignancy and
metastatic right axillary lymphadenopathy.

BI-RADS CATEGORY  6: Known biopsy-proven malignancy.

## 2020-11-06 MED ORDER — GADOBUTROL 1 MMOL/ML IV SOLN
7.0000 mL | Freq: Once | INTRAVENOUS | Status: AC | PRN
  Administered 2020-11-06: 7 mL via INTRAVENOUS

## 2020-11-06 NOTE — Telephone Encounter (Signed)
[id number] - TREATMENT OF REFRACTORY NAUSEA  Called patient to follow up on her interest in the above study and see if she has had a chance to think about it and/or has any questions. Patient reported she has not had a chance to read the consent form and she feels a little overwhelmed at this time. Patient declined stating she does not not want to add anything else to do at this time. Dr. Lindi Adie notified. Thanked patient for her time and briefly introduced the DCP-001 study.  Patient agreed for research nurse to talk to her about the DCP study during one of her treatments in the infusion room. Preferably on the second treatment and not the first. Thanked patient and plan to meet her in a few weeks to discuss DCP study.  [name], BSN, RN Clinical Research Nurse 11/06/2020 1:30 PM

## 2020-11-07 ENCOUNTER — Encounter: Payer: Self-pay | Admitting: Adult Health

## 2020-11-07 ENCOUNTER — Encounter: Payer: Self-pay | Admitting: Hematology and Oncology

## 2020-11-07 ENCOUNTER — Inpatient Hospital Stay (HOSPITAL_BASED_OUTPATIENT_CLINIC_OR_DEPARTMENT_OTHER): Payer: Medicare HMO | Admitting: Adult Health

## 2020-11-07 ENCOUNTER — Inpatient Hospital Stay: Payer: Medicare HMO

## 2020-11-07 DIAGNOSIS — Z170 Estrogen receptor positive status [ER+]: Secondary | ICD-10-CM | POA: Diagnosis not present

## 2020-11-07 DIAGNOSIS — C50411 Malignant neoplasm of upper-outer quadrant of right female breast: Secondary | ICD-10-CM

## 2020-11-07 MED ORDER — CEPHALEXIN 500 MG PO CAPS: 500.0000 mg | ORAL_CAPSULE | Freq: Four times a day (QID) | ORAL | 0 refills | Status: DC

## 2020-11-07 MED FILL — Fosaprepitant Dimeglumine For IV Infusion 150 MG (Base Eq): INTRAVENOUS | Qty: 5 | Status: AC

## 2020-11-07 MED FILL — Dexamethasone Sodium Phosphate Inj 100 MG/10ML: INTRAMUSCULAR | Qty: 1 | Status: AC

## 2020-11-07 NOTE — Progress Notes (Unsigned)
Left port 9.27.2022

## 2020-11-07 NOTE — Assessment & Plan Note (Signed)
Palpable lump in the right breast, mammogram detected 3.6 cm mass with several axillary lymph nodes, biopsy revealed grade 1-2 IDC, lymph nodes positive for cancer, ER 40%, PR 0%, Ki-67 15%, HER2 positive 3+ by [facility]  Pathology and radiology counseling: Discussed with the patient, the details of pathology including the type of breast cancer,the clinical staging, the significance of ER, PR and HER-2/neu receptors and the implications for treatment. After reviewing the pathology in detail, we proceeded to discuss the different treatment options between surgery, radiation, chemotherapy, antiestrogen therapies.  Recommendation based on multidisciplinary tumor board: 1. Neoadjuvant chemotherapy with TCH Perjeta 6 cycles followed by Herceptin Perjeta maintenance versus Kadcyla maintenance (based on response to neoadjuvant chemo) for 1 year 2. Followed by breast conserving surgery if possible with sentinel lymph node study 3. Followed by adjuvant radiation therapy if patient had lumpectomy  __________________________________________________________________________________  Lisa Chapman is due to start [name] tomorrow, however her port is possibly infected and we will not be able to use it.  Dr. Lindi Adie reviewed it with me and we will start keflex.  I sent these antibiotics in.  We will postpone her chemo x 1 week.  I notified our breast navigators as well.

## 2020-11-07 NOTE — Progress Notes (Addendum)
[facility] Follow up:    [name], MD [address] [place] [address]   DIAGNOSIS: Cancer Staging Malignant neoplasm of upper-outer quadrant of right breast in female, estrogen receptor positive ([place]) Staging form: Breast, AJCC 8th Edition - Clinical stage from 10/31/2020: Stage IIA (cT2, cN1, cM0, G2, ER+, PR-, HER2+) - Signed by Nicholas Lose, MD on 10/31/2020 Stage prefix: Initial diagnosis Histologic grading system: 3 grade system   SUMMARY OF ONCOLOGIC HISTORY: Oncology History  Malignant neoplasm of upper-outer quadrant of right breast in female, estrogen receptor positive ([place])  10/04/2020 Initial Diagnosis   Palpable lump in the right breast, mammogram detected 3.6 cm mass with several axillary lymph nodes, biopsy revealed grade 1-2 IDC, lymph nodes positive for cancer, ER 40%, PR 0%, Ki-67 15%, HER2 positive 3+ by [facility]   10/31/2020 Cancer Staging   Staging form: Breast, AJCC 8th Edition - Clinical stage from 10/31/2020: Stage IIA (cT2, cN1, cM0, G2, ER+, PR-, HER2+) - Signed by Nicholas Lose, MD on 10/31/2020 Stage prefix: Initial diagnosis Histologic grading system: 3 grade system   11/08/2020 -  Chemotherapy    Patient is on Treatment Plan: BREAST  DOCETAXEL + CARBOPLATIN + TRASTUZUMAB + PERTUZUMAB  (TCHP) Q21D          CURRENT THERAPY: to start TCHP tomorrow  INTERVAL HISTORY: LANIQUE GONZALO 75 y.o. female is here today for chemo education.  I was asked to see her to evaluate her by our chemo nurse for infection at her port site since it looks red, warm, and swollen.  Jenice is here with her husband Bruce.  She notes that her port was placed on Friday, 9/23, 2022 by Dr. Ninfa Linden.  She says that the redness and swelling have improved some.  She endorses some mild tenderness and warmth at the sight, however denies any fever or chills.  Her temp today was 98.4 tympanic.   Port site on 11/07/2020    is allergic to ibuprofen, other, boniva [ibandronic  acid], and cortisone.  MEDICAL HISTORY: Past Medical History:  Diagnosis Date   Cancer ([place]) 2022   Breast Cancer   Diverticulosis    Dysrhythmia    Heart murmur    History of colonic polyps    History of DVT (deep vein thrombosis)    Right leg, April 2015   History of shingles    Mixed hyperlipidemia     SURGICAL HISTORY: Past Surgical History:  Procedure Laterality Date   HERNIA REPAIR     MENISCUS REPAIR Left 03/2019   PORTACATH PLACEMENT Left 11/03/2020   Procedure: INSERTION PORT-A-CATH;  Surgeon: Coralie Keens, MD;  Location: [facility] OR;  Service: General;  Laterality: Left;   TONSILLECTOMY      SOCIAL HISTORY: Social History   Socioeconomic History   Marital status: Married    Spouse name: Not on file   Number of children: Not on file   Years of education: Not on file   Highest education level: Not on file  Occupational History   Not on file  Tobacco Use   Smoking status: Never   Smokeless tobacco: Never  Vaping Use   Vaping Use: Never used  Substance and Sexual Activity   Alcohol use: No    Alcohol/week: 0.0 standard drinks   Drug use: No   Sexual activity: Not on file  Other Topics Concern   Not on file  Social History Narrative   Married. No children   Social Determinants of Health  Financial Resource Strain: Not on file  Food Insecurity: Not on file  Transportation Needs: Not on file  Physical Activity: Not on file  Stress: Not on file  Social Connections: Not on file  Intimate Partner Violence: Not on file    FAMILY HISTORY: Family History  Problem Relation Age of Onset   Heart attack Mother    COPD Father    Heart attack Father    Emphysema Father     Review of Systems  Constitutional:  Negative for appetite change, chills, fatigue, fever and unexpected weight change.  HENT:   Negative for hearing loss, lump/mass and trouble swallowing.   Eyes:  Negative for eye problems and icterus.  Respiratory:  Negative for chest tightness,  cough and shortness of breath.   Cardiovascular:  Negative for chest pain, leg swelling and palpitations.  Gastrointestinal:  Negative for abdominal distention, abdominal pain, constipation, diarrhea, nausea and vomiting.  Endocrine: Negative for hot flashes.  Genitourinary:  Negative for difficulty urinating.   Musculoskeletal:  Negative for arthralgias.  Skin:  Negative for itching and rash.  Neurological:  Negative for dizziness, extremity weakness, headaches and numbness.  Hematological:  Negative for adenopathy. Does not bruise/bleed easily.  Psychiatric/Behavioral:  Negative for depression. The patient is not nervous/anxious.      PHYSICAL EXAMINATION  ECOG PERFORMANCE STATUS: 1 - Symptomatic but completely ambulatory    Physical Exam Constitutional:      General: She is not in acute distress.    Appearance: Normal appearance. She is not toxic-appearing.  HENT:     Head: Normocephalic and atraumatic.  Eyes:     General: No scleral icterus. Cardiovascular:     Rate and Rhythm: Normal rate and regular rhythm.     Pulses: Normal pulses.     Heart sounds: Normal heart sounds.  Pulmonary:     Effort: Pulmonary effort is normal.     Breath sounds: Normal breath sounds.  Abdominal:     General: Abdomen is flat. Bowel sounds are normal. There is no distension.     Palpations: Abdomen is soft.     Tenderness: There is no abdominal tenderness.  Musculoskeletal:        General: No swelling.     Cervical back: Neck supple.  Lymphadenopathy:     Cervical: No cervical adenopathy.  Skin:    General: Skin is warm and dry.     Findings: No rash.  Neurological:     General: No focal deficit present.     Mental Status: She is alert.  Psychiatric:        Mood and Affect: Mood normal.        Behavior: Behavior normal.    LABORATORY DATA:  CBC    Component Value Date/Time   WBC 6.6 10/27/2020 1023   RBC 4.76 10/27/2020 1023   HGB 12.8 10/27/2020 1023   HCT 41.5 10/27/2020  1023   PLT 252 10/27/2020 1023   MCV 87.2 10/27/2020 1023   MCH 26.9 10/27/2020 1023   MCHC 30.8 10/27/2020 1023   RDW 14.7 10/27/2020 1023    CMP     Component Value Date/Time   NA 140 10/27/2020 1023   K 3.8 10/27/2020 1023   CL 108 10/27/2020 1023   CO2 28 10/27/2020 1023   GLUCOSE 110 (H) 10/27/2020 1023   BUN 9 10/27/2020 1023   CREATININE 0.61 10/27/2020 1023   CALCIUM 9.3 10/27/2020 1023   GFRNONAA >60 10/27/2020 1023  PENDING LABS:   RADIOGRAPHIC STUDIES:  MR BREAST BILATERAL W WO CONTRAST INC CAD  Result Date: 11/06/2020 CLINICAL DATA:  Patient with recently diagnosed right breast carcinoma and metastatic right axillary lymphadenopathy, biopsies performed on 10/11/2020. Assess for extent of disease. Patient scheduled for neoadjuvant chemotherapy. LABS:  No labs drawn at time of imaging EXAM: BILATERAL BREAST MRI WITH AND WITHOUT CONTRAST TECHNIQUE: Multiplanar, multisequence MR images of both breasts were obtained prior to and following the intravenous administration of 7 ml of Gadavist Three-dimensional MR images were rendered by post-processing of the original MR data on an independent workstation. The three-dimensional MR images were interpreted, and findings are reported in the following complete MRI report for this study. Three dimensional images were evaluated at the independent interpreting workstation using the DynaCAD thin client. COMPARISON:  Previous exam(s). FINDINGS: Breast composition: b. Scattered fibroglandular tissue. Background parenchymal enhancement: Minimal Right breast: Irregular enhancing mass lies in the right breast, laterally, middle depth, spanning 3.6 cm, anterior-posterior, by 2.4 cm superior to inferior, by 4.2 cm right to left. Mass contains a central focus of susceptibility artifact reflecting the post biopsy marker clip. There are no other areas of abnormal right breast enhancement. Left breast: No mass or abnormal enhancement. Lymph  nodes: 9 discrete abnormal right axillary lymph nodes are noted on the right. The most anterior node contains peripheral susceptibility artifact consistent with the post biopsy marker clip. This node measures 1.8 cm in short axis. No abnormal left axillary lymph nodes. Ancillary findings: Left upper inner chest wall Port-A-Cath with adjacent edema. IMPRESSION: 1. Biopsy-proven right breast malignancy spans 3.6 x 2.4 x 4.2 cm, centered in the lateral right breast, middle depth. 2. No evidence of additional areas of right breast malignancy. 3. No evidence of left breast malignancy. 4. 9 abnormal right axillary lymph nodes consistent with metastatic disease. RECOMMENDATION: Treatment as planned for the known right breast malignancy and metastatic right axillary lymphadenopathy. BI-RADS CATEGORY  6: Known biopsy-proven malignancy. Electronically Signed   By: Lajean Manes M.D.   On: 11/06/2020 12:32         ASSESSMENT and THERAPY PLAN:   Malignant neoplasm of upper-outer quadrant of right breast in female, estrogen receptor positive ([facility]) Palpable lump in the right breast, mammogram detected 3.6 cm mass with several axillary lymph nodes, biopsy revealed grade 1-2 IDC, lymph nodes positive for cancer, ER 40%, PR 0%, Ki-67 15%, HER2 positive 3+ by [facility]  Pathology and radiology counseling: Discussed with the patient, the details of pathology including the type of breast cancer,the clinical staging, the significance of ER, PR and HER-2/neu receptors and the implications for treatment. After reviewing the pathology in detail, we proceeded to discuss the different treatment options between surgery, radiation, chemotherapy, antiestrogen therapies.  Recommendation based on multidisciplinary tumor board: 1. Neoadjuvant chemotherapy with TCH Perjeta 6 cycles followed by Herceptin Perjeta maintenance versus Kadcyla maintenance (based on response to neoadjuvant chemo) for 1 year 2. Followed by breast conserving  surgery if possible with sentinel lymph node study 3. Followed by adjuvant radiation therapy if patient had lumpectomy  __________________________________________________________________________________  Vaughan Basta is due to start [facility] tomorrow, however her port is possibly infected and we will not be able to use it.  Dr. Lindi Adie reviewed it with me and we will start keflex.  I sent these antibiotics in.  We will postpone her chemo x 1 week.  I notified our breast navigators as well.    All questions were answered. The patient knows to call the  clinic with any problems, questions or concerns. We can certainly see the patient much sooner if necessary.  Wilber Bihari, NP 11/07/20 11:14 AM Medical Oncology and Hematology [facility] [place], [place] [address] Tel. [phone]    Fax. [phone]  Attending Note  I personally saw and examined Glendell Docker. The plan of care was discussed with her. I agree with the physical exam findings and assessment and plan as documented above. I performed the majority of the counseling and assessment and plan regarding this encounter Port site discomfort with redness and tenderness: We cannot initiate treatment this week.  I recommended starting her on Keflex and that delaying her chemo by another week.  Patient was relieved to hear that. Return to clinic to start chemo.  Signed Harriette Ohara, MD

## 2020-11-07 NOTE — Progress Notes (Signed)
Met with patient at registration to introduce myself as Financial Resource Specialist and to offer available resources.  Discussed one-time $1000 [employer] grant and qualifications to assist with personal expenses while going through treatment.  Gave her my card if interested in applying and for any additional financial questions or concerns. 

## 2020-11-08 ENCOUNTER — Inpatient Hospital Stay: Payer: Medicare HMO | Admitting: Hematology and Oncology

## 2020-11-08 ENCOUNTER — Inpatient Hospital Stay: Payer: Medicare HMO

## 2020-11-08 ENCOUNTER — Ambulatory Visit: Payer: Medicare HMO | Admitting: Hematology and Oncology

## 2020-11-08 ENCOUNTER — Encounter: Payer: Self-pay | Admitting: *Deleted

## 2020-11-08 ENCOUNTER — Other Ambulatory Visit: Payer: Medicare HMO

## 2020-11-09 NOTE — Progress Notes (Signed)
Pharmacist Chemotherapy Monitoring - Initial Assessment    Anticipated start date: 11/17/20   The following has been reviewed per standard work regarding the patient's treatment regimen: The patient's diagnosis, treatment plan and drug doses, and organ/hematologic function Lab orders and baseline tests specific to treatment regimen  The treatment plan start date, drug sequencing, and pre-medications Prior authorization status  Patient's documented medication list, including drug-drug interaction screen and prescriptions for anti-emetics and supportive care specific to the treatment regimen The drug concentrations, fluid compatibility, administration routes, and timing of the medications to be used The patient's access for treatment and lifetime cumulative dose history, if applicable  The patient's medication allergies and previous infusion related reactions, if applicable   Changes made to treatment plan:  treatment plan date  Follow up needed:  N/A   Judge Stall, [place], 11/09/2020  3:21 PM

## 2020-11-10 ENCOUNTER — Inpatient Hospital Stay: Payer: Medicare HMO

## 2020-11-11 ENCOUNTER — Other Ambulatory Visit: Payer: Medicare HMO

## 2020-11-15 ENCOUNTER — Other Ambulatory Visit: Payer: Self-pay

## 2020-11-15 ENCOUNTER — Ambulatory Visit: Payer: Medicare HMO | Admitting: Hematology and Oncology

## 2020-11-15 ENCOUNTER — Ambulatory Visit (HOSPITAL_COMMUNITY)
Admission: RE | Admit: 2020-11-15 | Discharge: 2020-11-15 | Disposition: A | Discharge status: Home / Self Care | Payer: Medicare HMO | Source: Ambulatory Visit | Attending: Hematology and Oncology | Admitting: Hematology and Oncology

## 2020-11-15 ENCOUNTER — Other Ambulatory Visit: Payer: Medicare HMO

## 2020-11-15 DIAGNOSIS — C50411 Malignant neoplasm of upper-outer quadrant of right female breast: Secondary | ICD-10-CM

## 2020-11-15 DIAGNOSIS — Z0189 Encounter for other specified special examinations: Secondary | ICD-10-CM

## 2020-11-15 DIAGNOSIS — Z170 Estrogen receptor positive status [ER+]: Secondary | ICD-10-CM | POA: Diagnosis not present

## 2020-11-15 DIAGNOSIS — E785 Hyperlipidemia, unspecified: Secondary | ICD-10-CM | POA: Insufficient documentation

## 2020-11-15 DIAGNOSIS — I071 Rheumatic tricuspid insufficiency: Secondary | ICD-10-CM | POA: Diagnosis not present

## 2020-11-15 DIAGNOSIS — Z01818 Encounter for other preprocedural examination: Secondary | ICD-10-CM | POA: Insufficient documentation

## 2020-11-15 LAB — ECHOCARDIOGRAM COMPLETE
Area-P 1/2: 4.06 cm2
S' Lateral: 2.8 cm

## 2020-11-15 NOTE — Progress Notes (Signed)
  Echocardiogram 2D Echocardiogram has been performed.  Lisa Chapman 11/15/2020, 10:50 AM

## 2020-11-16 ENCOUNTER — Ambulatory Visit: Payer: Medicare HMO

## 2020-11-16 ENCOUNTER — Ambulatory Visit: Payer: Medicare HMO | Admitting: Hematology and Oncology

## 2020-11-16 ENCOUNTER — Other Ambulatory Visit: Payer: Medicare HMO

## 2020-11-16 MED FILL — Fosaprepitant Dimeglumine For IV Infusion 150 MG (Base Eq): INTRAVENOUS | Qty: 5 | Status: AC

## 2020-11-16 MED FILL — Dexamethasone Sodium Phosphate Inj 100 MG/10ML: INTRAMUSCULAR | Qty: 1 | Status: AC

## 2020-11-16 NOTE — Progress Notes (Signed)
Patient Care Team: [name], MD as PCP - General (Internal Medicine) Mauro Kaufmann, RN as Oncology Nurse Navigator Rockwell Germany, RN as Oncology Nurse Navigator  DIAGNOSIS:    ICD-10-CM   1. Malignant neoplasm of upper-outer quadrant of right breast in female, estrogen receptor positive ([place])  C50.411    Z17.0       SUMMARY OF ONCOLOGIC HISTORY: Oncology History  Malignant neoplasm of upper-outer quadrant of right breast in female, estrogen receptor positive ([place])  10/04/2020 Initial Diagnosis   Palpable lump in the right breast, mammogram detected 3.6 cm mass with several axillary lymph nodes, biopsy revealed grade 1-2 IDC, lymph nodes positive for cancer, ER 40%, PR 0%, Ki-67 15%, HER2 positive 3+ by [facility]   10/31/2020 Cancer Staging   Staging form: Breast, AJCC 8th Edition - Clinical stage from 10/31/2020: Stage IIA (cT2, cN1, cM0, G2, ER+, PR-, HER2+) - Signed by Nicholas Lose, MD on 10/31/2020 Stage prefix: Initial diagnosis Histologic grading system: 3 grade system   11/17/2020 -  Chemotherapy   Patient is on Treatment Plan : BREAST  Docetaxel + Carboplatin + Trastuzumab + Pertuzumab  (TCHP) q21d        CHIEF COMPLIANT: Cycle 1 TCHP  INTERVAL HISTORY: Lisa Chapman is a 75 y.o. with above-mentioned history of right breast cancer, currently on chemotherapy with TCHP. She presents to the clinic today for treatment.  Her port appears to have healed very well from antibiotics.  She no longer has pain discomfort or swelling or redness.  ALLERGIES:  is allergic to ibuprofen, other, boniva [ibandronic acid], and cortisone.  MEDICATIONS:  Current Outpatient Medications  Medication Sig Dispense Refill   acetaminophen (TYLENOL) 500 MG tablet Take 1,000 mg by mouth every 6 (six) hours as needed for mild pain.     cephALEXin (KEFLEX) 500 MG capsule Take 1 capsule (500 mg total) by mouth 4 (four) times daily. 28 capsule 0   dexamethasone (DECADRON) 4 MG tablet Take 1 tablet  (4 mg total) by mouth daily. Take 1 tablet day before chemo and 1 tablet day after chemo with food 12 tablet 0   lidocaine-prilocaine (EMLA) cream Apply to affected area once 30 g 3   ondansetron (ZOFRAN) 8 MG tablet Take 1 tablet (8 mg total) by mouth 2 (two) times daily as needed (Nausea or vomiting). Start on the third day after chemotherapy. 30 tablet 1   prochlorperazine (COMPAZINE) 10 MG tablet Take 1 tablet (10 mg total) by mouth every 6 (six) hours as needed (Nausea or vomiting). 30 tablet 1   rosuvastatin (CRESTOR) 20 MG tablet Take 20 mg by mouth daily.     sodium chloride (OCEAN) 0.65 % SOLN nasal spray Place 1 spray into both nostrils as needed for congestion.     traMADol (ULTRAM) 50 MG tablet Take 1 tablet (50 mg total) by mouth every 6 (six) hours as needed for moderate pain or severe pain. 20 tablet 0   XARELTO 20 MG TABS tablet TAKE 1 TABLET (20 MG TOTAL) BY MOUTH DAILY WITH SUPPER. (Patient taking differently: Take 20 mg by mouth daily with supper.) 30 tablet 11   No current facility-administered medications for this visit.    PHYSICAL EXAMINATION: ECOG PERFORMANCE STATUS: 0 - Asymptomatic  Vitals:   11/17/20 0803  BP: (!) 151/76  Pulse: 67  Resp: 18  Temp: (!) 97.5 F (36.4 C)  SpO2: 98%   Filed Weights   11/17/20 0803  Weight: 153 lb 11.2 oz (  69.7 kg)      LABORATORY DATA:  I have reviewed the data as listed CMP Latest Ref Rng & Units 11/17/2020 10/27/2020  Glucose 70 - 99 mg/dL 102(H) 110(H)  BUN 8 - 23 mg/dL 12 9  Creatinine 0.44 - 1.00 mg/dL 0.72 0.61  Sodium 135 - 145 mmol/L 141 140  Potassium 3.5 - 5.1 mmol/L 4.1 3.8  Chloride 98 - 111 mmol/L 109 108  CO2 22 - 32 mmol/L 25 28  Calcium 8.9 - 10.3 mg/dL 9.3 9.3  Total Protein 6.5 - 8.1 g/dL 7.4 -  Total Bilirubin 0.3 - 1.2 mg/dL 0.4 -  Alkaline Phos 38 - 126 U/L 60 -  AST 15 - 41 U/L 17 -  ALT 0 - 44 U/L 9 -    Lab Results  Component Value Date   WBC 7.6 11/17/2020   HGB 11.8 (L) 11/17/2020    HCT 37.0 11/17/2020   MCV 83.7 11/17/2020   PLT 260 11/17/2020   NEUTROABS 5.2 11/17/2020    ASSESSMENT & PLAN:  Malignant neoplasm of upper-outer quadrant of right breast in female, estrogen receptor positive ([facility]) Palpable lump in the right breast, mammogram detected 3.6 cm mass with several axillary lymph nodes, biopsy revealed grade 1-2 IDC, lymph nodes positive for cancer, ER 40%, PR 0%, Ki-67 15%, HER2 positive 3+ by IHC  Treatment plan: 1. Neoadjuvant chemotherapy with TCH Perjeta 6 cycles followed by Herceptin Perjeta maintenance versus Kadcyla maintenance (based on response to neoadjuvant chemo) for 1 year 2. Followed by breast conserving surgery if possible with sentinel lymph node study 3. Followed by adjuvant radiation therapy if patient had lumpectomy  __________________________________________________________________________________ Current treatment: Cycle 1 [place] site infection: Resolved Labs reviewed, chemo education completed, chemo consent obtained, antiemetics reviewed We are starting the Taxotere and carboplatin at a slightly lower dosage to ensure better tolerability to treatment.  Return to clinic in 1 week for toxicity check    No orders of the defined types were placed in this encounter.  The patient has a good understanding of the overall plan. she agrees with it. she will call with any problems that may develop before the next visit here.  Total time spent: 30 mins including face to face time and time spent for planning, charting and coordination of care  Rulon Eisenmenger, MD, MPH 11/17/2020  I, Thana Ates, am acting as scribe for Dr. Nicholas Lose.  I have reviewed the above documentation for accuracy and completeness, and I agree with the above.

## 2020-11-17 ENCOUNTER — Encounter: Payer: Self-pay | Admitting: *Deleted

## 2020-11-17 ENCOUNTER — Other Ambulatory Visit: Payer: Self-pay

## 2020-11-17 ENCOUNTER — Inpatient Hospital Stay: Payer: Medicare HMO | Attending: Hematology and Oncology

## 2020-11-17 ENCOUNTER — Inpatient Hospital Stay: Payer: Medicare HMO

## 2020-11-17 ENCOUNTER — Inpatient Hospital Stay: Payer: Medicare HMO | Admitting: Hematology and Oncology

## 2020-11-17 VITALS — BP 133/78 | HR 64 | Temp 97.8°F | Resp 18

## 2020-11-17 DIAGNOSIS — Z95828 Presence of other vascular implants and grafts: Secondary | ICD-10-CM

## 2020-11-17 DIAGNOSIS — Z452 Encounter for adjustment and management of vascular access device: Secondary | ICD-10-CM | POA: Insufficient documentation

## 2020-11-17 DIAGNOSIS — C50411 Malignant neoplasm of upper-outer quadrant of right female breast: Secondary | ICD-10-CM | POA: Diagnosis present

## 2020-11-17 DIAGNOSIS — C773 Secondary and unspecified malignant neoplasm of axilla and upper limb lymph nodes: Secondary | ICD-10-CM | POA: Insufficient documentation

## 2020-11-17 DIAGNOSIS — Z5112 Encounter for antineoplastic immunotherapy: Secondary | ICD-10-CM | POA: Diagnosis not present

## 2020-11-17 DIAGNOSIS — Z5111 Encounter for antineoplastic chemotherapy: Secondary | ICD-10-CM | POA: Insufficient documentation

## 2020-11-17 DIAGNOSIS — Z170 Estrogen receptor positive status [ER+]: Secondary | ICD-10-CM

## 2020-11-17 DIAGNOSIS — Z5189 Encounter for other specified aftercare: Secondary | ICD-10-CM | POA: Diagnosis not present

## 2020-11-17 DIAGNOSIS — D6481 Anemia due to antineoplastic chemotherapy: Secondary | ICD-10-CM | POA: Diagnosis not present

## 2020-11-17 HISTORY — DX: Presence of other vascular implants and grafts: Z95.828

## 2020-11-17 LAB — CBC WITH DIFFERENTIAL (CANCER CENTER ONLY)
Abs Immature Granulocytes: 0.02 10*3/uL (ref 0.00–0.07)
Basophils Absolute: 0 10*3/uL (ref 0.0–0.1)
Basophils Relative: 0 %
Eosinophils Absolute: 0.1 10*3/uL (ref 0.0–0.5)
Eosinophils Relative: 1 %
HCT: 37 % (ref 36.0–46.0)
Hemoglobin: 11.8 g/dL — ABNORMAL LOW (ref 12.0–15.0)
Immature Granulocytes: 0 %
Lymphocytes Relative: 21 %
Lymphs Abs: 1.6 10*3/uL (ref 0.7–4.0)
MCH: 26.7 pg (ref 26.0–34.0)
MCHC: 31.9 g/dL (ref 30.0–36.0)
MCV: 83.7 fL (ref 80.0–100.0)
Monocytes Absolute: 0.7 10*3/uL (ref 0.1–1.0)
Monocytes Relative: 9 %
Neutro Abs: 5.2 10*3/uL (ref 1.7–7.7)
Neutrophils Relative %: 69 %
Platelet Count: 260 10*3/uL (ref 150–400)
RBC: 4.42 MIL/uL (ref 3.87–5.11)
RDW: 14.4 % (ref 11.5–15.5)
WBC Count: 7.6 10*3/uL (ref 4.0–10.5)
nRBC: 0 % (ref 0.0–0.2)

## 2020-11-17 LAB — CMP (CANCER CENTER ONLY)
ALT: 9 U/L (ref 0–44)
AST: 17 U/L (ref 15–41)
Albumin: 3.6 g/dL (ref 3.5–5.0)
Alkaline Phosphatase: 60 U/L (ref 38–126)
Anion gap: 7 (ref 5–15)
BUN: 12 mg/dL (ref 8–23)
CO2: 25 mmol/L (ref 22–32)
Calcium: 9.3 mg/dL (ref 8.9–10.3)
Chloride: 109 mmol/L (ref 98–111)
Creatinine: 0.72 mg/dL (ref 0.44–1.00)
GFR, Estimated: >60 mL/min (ref >60)
Glucose, Bld: 102 mg/dL — ABNORMAL HIGH (ref 70–99)
Potassium: 4.1 mmol/L (ref 3.5–5.1)
Sodium: 141 mmol/L (ref 135–145)
Total Bilirubin: 0.4 mg/dL (ref 0.3–1.2)
Total Protein: 7.4 g/dL (ref 6.5–8.1)

## 2020-11-17 MED ORDER — HEPARIN SOD (PORK) LOCK FLUSH 100 UNIT/ML IV SOLN
500.0000 [IU] | Freq: Once | INTRAVENOUS | Status: AC | PRN
  Administered 2020-11-17: 500 [IU]

## 2020-11-17 MED ORDER — SODIUM CHLORIDE 0.9 % IV SOLN
10.0000 mg | Freq: Once | INTRAVENOUS | Status: AC
  Administered 2020-11-17: 10 mg via INTRAVENOUS
  Filled 2020-11-17: qty 10
  Filled 2020-11-17: qty 1

## 2020-11-17 MED ORDER — ACETAMINOPHEN 325 MG PO TABS
650.0000 mg | ORAL_TABLET | Freq: Once | ORAL | Status: AC
  Administered 2020-11-17: 650 mg via ORAL
  Filled 2020-11-17: qty 2

## 2020-11-17 MED ORDER — SODIUM CHLORIDE 0.9 % IV SOLN
65.0000 mg/m2 | Freq: Once | INTRAVENOUS | Status: AC
  Administered 2020-11-17: 110 mg via INTRAVENOUS
  Filled 2020-11-17: qty 11

## 2020-11-17 MED ORDER — SODIUM CHLORIDE 0.9% FLUSH
10.0000 mL | INTRAVENOUS | Status: DC | PRN
  Administered 2020-11-17: 10 mL

## 2020-11-17 MED ORDER — DIPHENHYDRAMINE HCL 25 MG PO CAPS
25.0000 mg | ORAL_CAPSULE | Freq: Once | ORAL | Status: AC
  Administered 2020-11-17: 25 mg via ORAL
  Filled 2020-11-17: qty 1

## 2020-11-17 MED ORDER — SODIUM CHLORIDE 0.9 % IV SOLN: Freq: Once | INTRAVENOUS | Status: AC

## 2020-11-17 MED ORDER — PALONOSETRON HCL INJECTION 0.25 MG/5ML
0.2500 mg | Freq: Once | INTRAVENOUS | Status: AC
  Administered 2020-11-17: 0.25 mg via INTRAVENOUS
  Filled 2020-11-17: qty 5

## 2020-11-17 MED ORDER — SODIUM CHLORIDE 0.9 % IV SOLN
150.0000 mg | Freq: Once | INTRAVENOUS | Status: AC
  Administered 2020-11-17: 150 mg via INTRAVENOUS
  Filled 2020-11-17: qty 150
  Filled 2020-11-17: qty 5

## 2020-11-17 MED ORDER — SODIUM CHLORIDE 0.9% FLUSH
10.0000 mL | Freq: Once | INTRAVENOUS | Status: AC
  Administered 2020-11-17: 10 mL

## 2020-11-17 MED ORDER — TRASTUZUMAB-ANNS CHEMO 150 MG IV SOLR
8.0000 mg/kg | Freq: Once | INTRAVENOUS | Status: AC
  Administered 2020-11-17: 546 mg via INTRAVENOUS
  Filled 2020-11-17: qty 26

## 2020-11-17 MED ORDER — SODIUM CHLORIDE 0.9 % IV SOLN
420.0000 mg | Freq: Once | INTRAVENOUS | Status: AC
  Administered 2020-11-17: 420 mg via INTRAVENOUS
  Filled 2020-11-17: qty 14

## 2020-11-17 MED ORDER — SODIUM CHLORIDE 0.9 % IV SOLN
317.6000 mg | Freq: Once | INTRAVENOUS | Status: AC
  Administered 2020-11-17: 320 mg via INTRAVENOUS
  Filled 2020-11-17: qty 32

## 2020-11-17 NOTE — Assessment & Plan Note (Signed)
Palpable lump in the right breast, mammogram detected 3.6 cm mass with several axillary lymph nodes, biopsy revealed grade 1-2 IDC, lymph nodes positive for cancer, ER 40%, PR 0%, Ki-67 15%, HER2 positive 3+ by IHC  Treatment plan: 1. Neoadjuvant chemotherapy with TCH Perjeta 6 cycles followed by Herceptin Perjeta maintenance versus Kadcyla maintenance (based on response to neoadjuvant chemo) for 1 year 2. Followed by breast conserving surgery if possible with sentinel lymph node study 3. Followed by adjuvant radiation therapy if patient had lumpectomy __________________________________________________________________________________ Current treatment: Cycle 1 [place] site infection: Labs reviewed, chemo education completed, chemo consent obtained, antiemetics reviewed Return to clinic in 1 week for toxicity check

## 2020-11-17 NOTE — Patient Instructions (Addendum)
[facility]  Discharge Instructions: Thank you for choosing [facility] to provide your oncology and hematology care.   If you have a lab appointment with the [facility], please go directly to the [facility] and check in at the registration area.   Wear comfortable clothing and clothing appropriate for easy access to any Portacath or PICC line.   We strive to give you quality time with your provider. You may need to reschedule your appointment if you arrive late (15 or more minutes).  Arriving late affects you and other patients whose appointments are after yours.  Also, if you miss three or more appointments without notifying the office, you may be dismissed from the clinic at the provider's discretion.      For prescription refill requests, have your pharmacy contact our office and allow 72 hours for refills to be completed.    Today you received the following chemotherapy and/or immunotherapy agents: Herceptin/Perjeta/Taxotere/Carboplatin.      To help prevent nausea and vomiting after your treatment, we encourage you to take your nausea medication as directed.  BELOW ARE SYMPTOMS THAT SHOULD BE REPORTED IMMEDIATELY: *FEVER GREATER THAN 100.4 F (38 C) OR HIGHER *CHILLS OR SWEATING *NAUSEA AND VOMITING THAT IS NOT CONTROLLED WITH YOUR NAUSEA MEDICATION *UNUSUAL SHORTNESS OF BREATH *UNUSUAL BRUISING OR BLEEDING *URINARY PROBLEMS (pain or burning when urinating, or frequent urination) *BOWEL PROBLEMS (unusual diarrhea, constipation, pain near the anus) TENDERNESS IN MOUTH AND THROAT WITH OR WITHOUT PRESENCE OF ULCERS (sore throat, sores in mouth, or a toothache) UNUSUAL RASH, SWELLING OR PAIN  UNUSUAL VAGINAL DISCHARGE OR ITCHING   Items with * indicate a potential emergency and should be followed up as soon as possible or go to the Emergency Department if any problems should occur.  Please show the CHEMOTHERAPY ALERT CARD or  IMMUNOTHERAPY ALERT CARD at check-in to the Emergency Department and triage nurse.  Should you have questions after your visit or need to cancel or reschedule your appointment, please contact [facility]  Dept: [phone]  and follow the prompts.  Office hours are 8:00 a.m. to 4:30 p.m. Monday - Friday. Please note that voicemails left after 4:00 p.m. may not be returned until the following business day.  We are closed weekends and major holidays. You have access to a nurse at all times for urgent questions. Please call the main number to the clinic Dept: [phone] and follow the prompts.   For any non-urgent questions, you may also contact your provider using MyChart. We now offer e-Visits for anyone 81 and older to request care online for non-urgent symptoms. For details visit [email or web address].   Also download the MyChart app! Go to the app store, search "MyChart", open the app, select [facility], and log in with your MyChart username and password.  Due to Covid, a mask is required upon entering the hospital/clinic. If you do not have a mask, one will be given to you upon arrival. For doctor visits, patients may have 1 support person aged 15 or older with them. For treatment visits, patients cannot have anyone with them due to current Covid guidelines and our immunocompromised population.   Trastuzumab injection for infusion What is this medication? TRASTUZUMAB (tras TOO zoo mab) is a monoclonal antibody. It is used to treat breast cancer and stomach cancer. This medicine may be used for other purposes; ask your health care provider or pharmacist if you have questions. COMMON BRAND NAME(S):  Herceptin, Donnald Garre What should I tell my care team before I take this medication? They need to know if you have any of these conditions: heart disease heart failure lung or breathing disease, like asthma an unusual or  allergic reaction to trastuzumab, benzyl alcohol, or other medications, foods, dyes, or preservatives pregnant or trying to get pregnant breast-feeding How should I use this medication? This drug is given as an infusion into a vein. It is administered in a hospital or clinic by a specially trained health care professional. Talk to your pediatrician regarding the use of this medicine in children. This medicine is not approved for use in children. Overdosage: If you think you have taken too much of this medicine contact a poison control center or emergency room at once. NOTE: This medicine is only for you. Do not share this medicine with others. What if I miss a dose? It is important not to miss a dose. Call your doctor or health care professional if you are unable to keep an appointment. What may interact with this medication? This medicine may interact with the following medications: certain types of chemotherapy, such as daunorubicin, doxorubicin, epirubicin, and idarubicin This list may not describe all possible interactions. Give your health care provider a list of all the medicines, herbs, non-prescription drugs, or dietary supplements you use. Also tell them if you smoke, drink alcohol, or use illegal drugs. Some items may interact with your medicine. What should I watch for while using this medication? Visit your doctor for checks on your progress. Report any side effects. Continue your course of treatment even though you feel ill unless your doctor tells you to stop. Call your doctor or health care professional for advice if you get a fever, chills or sore throat, or other symptoms of a cold or flu. Do not treat yourself. Try to avoid being around people who are sick. You may experience fever, chills and shaking during your first infusion. These effects are usually mild and can be treated with other medicines. Report any side effects during the infusion to your health care professional. Fever  and chills usually do not happen with later infusions. Do not become pregnant while taking this medicine or for 7 months after stopping it. Women should inform their doctor if they wish to become pregnant or think they might be pregnant. Women of child-bearing potential will need to have a negative pregnancy test before starting this medicine. There is a potential for serious side effects to an unborn child. Talk to your health care professional or pharmacist for more information. Do not breast-feed an infant while taking this medicine or for 7 months after stopping it. Women must use effective birth control with this medicine. What side effects may I notice from receiving this medication? Side effects that you should report to your doctor or health care professional as soon as possible: allergic reactions like skin rash, itching or hives, swelling of the face, lips, or tongue chest pain or palpitations cough dizziness feeling faint or lightheaded, falls fever general ill feeling or flu-like symptoms signs of worsening heart failure like breathing problems; swelling in your legs and feet unusually weak or tired Side effects that usually do not require medical attention (report to your doctor or health care professional if they continue or are bothersome): bone pain changes in taste diarrhea joint pain nausea/vomiting weight loss This list may not describe all possible side effects. Call your doctor for medical advice about side effects.  You may report side effects to FDA at 1-800-FDA-1088. Where should I keep my medication? This drug is given in a hospital or clinic and will not be stored at home. NOTE: This sheet is a summary. It may not cover all possible information. If you have questions about this medicine, talk to your doctor, pharmacist, or health care provider.  2022 Elsevier/Gold Standard (2016-01-23 14:37:52)  Pertuzumab injection What is this medication? PERTUZUMAB (per TOOZ  ue mab) is a monoclonal antibody. It is used to treat breast cancer. This medicine may be used for other purposes; ask your health care provider or pharmacist if you have questions. COMMON BRAND NAME(S): PERJETA What should I tell my care team before I take this medication? They need to know if you have any of these conditions: heart disease heart failure high blood pressure history of irregular heart beat recent or ongoing radiation therapy an unusual or allergic reaction to pertuzumab, other medicines, foods, dyes, or preservatives pregnant or trying to get pregnant breast-feeding How should I use this medication? This medicine is for infusion into a vein. It is given by a health care professional in a hospital or clinic setting. Talk to your pediatrician regarding the use of this medicine in children. Special care may be needed. Overdosage: If you think you have taken too much of this medicine contact a poison control center or emergency room at once. NOTE: This medicine is only for you. Do not share this medicine with others. What if I miss a dose? It is important not to miss your dose. Call your doctor or health care professional if you are unable to keep an appointment. What may interact with this medication? Interactions are not expected. Give your health care provider a list of all the medicines, herbs, non-prescription drugs, or dietary supplements you use. Also tell them if you smoke, drink alcohol, or use illegal drugs. Some items may interact with your medicine. This list may not describe all possible interactions. Give your health care provider a list of all the medicines, herbs, non-prescription drugs, or dietary supplements you use. Also tell them if you smoke, drink alcohol, or use illegal drugs. Some items may interact with your medicine. What should I watch for while using this medication? Your condition will be monitored carefully while you are receiving this medicine.  Report any side effects. Continue your course of treatment even though you feel ill unless your doctor tells you to stop. Do not become pregnant while taking this medicine or for 7 months after stopping it. Women should inform their doctor if they wish to become pregnant or think they might be pregnant. Women of child-bearing potential will need to have a negative pregnancy test before starting this medicine. There is a potential for serious side effects to an unborn child. Talk to your health care professional or pharmacist for more information. Do not breast-feed an infant while taking this medicine or for 7 months after stopping it. Women must use effective birth control with this medicine. Call your doctor or health care professional for advice if you get a fever, chills or sore throat, or other symptoms of a cold or flu. Do not treat yourself. Try to avoid being around people who are sick. You may experience fever, chills, and headache during the infusion. Report any side effects during the infusion to your health care professional. What side effects may I notice from receiving this medication? Side effects that you should report to your doctor or health care professional  as soon as possible: breathing problems chest pain or palpitations dizziness feeling faint or lightheaded fever or chills skin rash, itching or hives sore throat swelling of the face, lips, or tongue swelling of the legs or ankles unusually weak or tired Side effects that usually do not require medical attention (report to your doctor or health care professional if they continue or are bothersome): diarrhea hair loss nausea, vomiting tiredness This list may not describe all possible side effects. Call your doctor for medical advice about side effects. You may report side effects to FDA at 1-800-FDA-1088. Where should I keep my medication? This drug is given in a hospital or clinic and will not be stored at home. NOTE:  This sheet is a summary. It may not cover all possible information. If you have questions about this medicine, talk to your doctor, pharmacist, or health care provider.  2022 Elsevier/Gold Standard (2015-03-02 12:08:50)  Docetaxel injection What is this medication? DOCETAXEL (doe se TAX el) is a chemotherapy drug. It targets fast dividing cells, like cancer cells, and causes these cells to die. This medicine is used to treat many types of cancers like breast cancer, certain stomach cancers, head and neck cancer, lung cancer, and prostate cancer. This medicine may be used for other purposes; ask your health care provider or pharmacist if you have questions. COMMON BRAND NAME(S): Docefrez, Taxotere What should I tell my care team before I take this medication? They need to know if you have any of these conditions: infection (especially a virus infection such as chickenpox, cold sores, or herpes) liver disease low blood counts, like low white cell, platelet, or red cell counts an unusual or allergic reaction to docetaxel, polysorbate 80, other chemotherapy agents, other medicines, foods, dyes, or preservatives pregnant or trying to get pregnant breast-feeding How should I use this medication? This drug is given as an infusion into a vein. It is administered in a hospital or clinic by a specially trained health care professional. Talk to your pediatrician regarding the use of this medicine in children. Special care may be needed. Overdosage: If you think you have taken too much of this medicine contact a poison control center or emergency room at once. NOTE: This medicine is only for you. Do not share this medicine with others. What if I miss a dose? It is important not to miss your dose. Call your doctor or health care professional if you are unable to keep an appointment. What may interact with this medication? Do not take this medicine with any of the following medications: live virus  vaccines This medicine may also interact with the following medications: aprepitant certain antibiotics like erythromycin or clarithromycin certain antivirals for HIV or hepatitis certain medicines for fungal infections like fluconazole, itraconazole, ketoconazole, posaconazole, or voriconazole cimetidine ciprofloxacin conivaptan cyclosporine dronedarone fluvoxamine grapefruit juice imatinib verapamil This list may not describe all possible interactions. Give your health care provider a list of all the medicines, herbs, non-prescription drugs, or dietary supplements you use. Also tell them if you smoke, drink alcohol, or use illegal drugs. Some items may interact with your medicine. What should I watch for while using this medication? Your condition will be monitored carefully while you are receiving this medicine. You will need important blood work done while you are taking this medicine. Call your doctor or health care professional for advice if you get a fever, chills or sore throat, or other symptoms of a cold or flu. Do not treat yourself. This drug decreases  your body's ability to fight infections. Try to avoid being around people who are sick. Some products may contain alcohol. Ask your health care professional if this medicine contains alcohol. Be sure to tell all health care professionals you are taking this medicine. Certain medicines, like metronidazole and disulfiram, can cause an unpleasant reaction when taken with alcohol. The reaction includes flushing, headache, nausea, vomiting, sweating, and increased thirst. The reaction can last from 30 minutes to several hours. You may get drowsy or dizzy. Do not drive, use machinery, or do anything that needs mental alertness until you know how this medicine affects you. Do not stand or sit up quickly, especially if you are an older patient. This reduces the risk of dizzy or fainting spells. Alcohol may interfere with the effect of this  medicine. Talk to your health care professional about your risk of cancer. You may be more at risk for certain types of cancer if you take this medicine. Do not become pregnant while taking this medicine or for 6 months after stopping it. Women should inform their doctor if they wish to become pregnant or think they might be pregnant. There is a potential for serious side effects to an unborn child. Talk to your health care professional or pharmacist for more information. Do not breast-feed an infant while taking this medicine or for 1 week after stopping it. Males who get this medicine must use a condom during sex with females who can get pregnant. If you get a woman pregnant, the baby could have birth defects. The baby could die before they are born. You will need to continue wearing a condom for 3 months after stopping the medicine. Tell your health care provider right away if your partner becomes pregnant while you are taking this medicine. This may interfere with the ability to father a child. You should talk to your doctor or health care professional if you are concerned about your fertility. What side effects may I notice from receiving this medication? Side effects that you should report to your doctor or health care professional as soon as possible: allergic reactions like skin rash, itching or hives, swelling of the face, lips, or tongue blurred vision breathing problems changes in vision low blood counts - This drug may decrease the number of white blood cells, red blood cells and platelets. You may be at increased risk for infections and bleeding. nausea and vomiting pain, redness or irritation at site where injected pain, tingling, numbness in the hands or feet redness, blistering, peeling, or loosening of the skin, including inside the mouth signs of decreased platelets or bleeding - bruising, pinpoint red spots on the skin, black, tarry stools, nosebleeds signs of decreased red blood  cells - unusually weak or tired, fainting spells, lightheadedness signs of infection - fever or chills, cough, sore throat, pain or difficulty passing urine swelling of the ankle, feet, hands Side effects that usually do not require medical attention (report to your doctor or health care professional if they continue or are bothersome): constipation diarrhea fingernail or toenail changes hair loss loss of appetite mouth sores muscle pain This list may not describe all possible side effects. Call your doctor for medical advice about side effects. You may report side effects to FDA at 1-800-FDA-1088. Where should I keep my medication? This drug is given in a hospital or clinic and will not be stored at home. NOTE: This sheet is a summary. It may not cover all possible information. If you have questions  about this medicine, talk to your doctor, pharmacist, or health care provider.  2022 Elsevier/Gold Standard (2018-12-28 19:50:31)  Carboplatin injection What is this medication? CARBOPLATIN (KAR boe pla tin) is a chemotherapy drug. It targets fast dividing cells, like cancer cells, and causes these cells to die. This medicine is used to treat ovarian cancer and many other cancers. This medicine may be used for other purposes; ask your health care provider or pharmacist if you have questions. COMMON BRAND NAME(S): Paraplatin What should I tell my care team before I take this medication? They need to know if you have any of these conditions: blood disorders hearing problems kidney disease recent or ongoing radiation therapy an unusual or allergic reaction to carboplatin, cisplatin, other chemotherapy, other medicines, foods, dyes, or preservatives pregnant or trying to get pregnant breast-feeding How should I use this medication? This drug is usually given as an infusion into a vein. It is administered in a hospital or clinic by a specially trained health care professional. Talk to your  pediatrician regarding the use of this medicine in children. Special care may be needed. Overdosage: If you think you have taken too much of this medicine contact a poison control center or emergency room at once. NOTE: This medicine is only for you. Do not share this medicine with others. What if I miss a dose? It is important not to miss a dose. Call your doctor or health care professional if you are unable to keep an appointment. What may interact with this medication? medicines for seizures medicines to increase blood counts like filgrastim, pegfilgrastim, sargramostim some antibiotics like amikacin, gentamicin, neomycin, streptomycin, tobramycin vaccines Talk to your doctor or health care professional before taking any of these medicines: acetaminophen aspirin ibuprofen ketoprofen naproxen This list may not describe all possible interactions. Give your health care provider a list of all the medicines, herbs, non-prescription drugs, or dietary supplements you use. Also tell them if you smoke, drink alcohol, or use illegal drugs. Some items may interact with your medicine. What should I watch for while using this medication? Your condition will be monitored carefully while you are receiving this medicine. You will need important blood work done while you are taking this medicine. This drug may make you feel generally unwell. This is not uncommon, as chemotherapy can affect healthy cells as well as cancer cells. Report any side effects. Continue your course of treatment even though you feel ill unless your doctor tells you to stop. In some cases, you may be given additional medicines to help with side effects. Follow all directions for their use. Call your doctor or health care professional for advice if you get a fever, chills or sore throat, or other symptoms of a cold or flu. Do not treat yourself. This drug decreases your body's ability to fight infections. Try to avoid being around people  who are sick. This medicine may increase your risk to bruise or bleed. Call your doctor or health care professional if you notice any unusual bleeding. Be careful brushing and flossing your teeth or using a toothpick because you may get an infection or bleed more easily. If you have any dental work done, tell your dentist you are receiving this medicine. Avoid taking products that contain aspirin, acetaminophen, ibuprofen, naproxen, or ketoprofen unless instructed by your doctor. These medicines may hide a fever. Do not become pregnant while taking this medicine. Women should inform their doctor if they wish to become pregnant or think they might be pregnant.  There is a potential for serious side effects to an unborn child. Talk to your health care professional or pharmacist for more information. Do not breast-feed an infant while taking this medicine. What side effects may I notice from receiving this medication? Side effects that you should report to your doctor or health care professional as soon as possible: allergic reactions like skin rash, itching or hives, swelling of the face, lips, or tongue signs of infection - fever or chills, cough, sore throat, pain or difficulty passing urine signs of decreased platelets or bleeding - bruising, pinpoint red spots on the skin, black, tarry stools, nosebleeds signs of decreased red blood cells - unusually weak or tired, fainting spells, lightheadedness breathing problems changes in hearing changes in vision chest pain high blood pressure low blood counts - This drug may decrease the number of white blood cells, red blood cells and platelets. You may be at increased risk for infections and bleeding. nausea and vomiting pain, swelling, redness or irritation at the injection site pain, tingling, numbness in the hands or feet problems with balance, talking, walking trouble passing urine or change in the amount of urine Side effects that usually do not  require medical attention (report to your doctor or health care professional if they continue or are bothersome): hair loss loss of appetite metallic taste in the mouth or changes in taste This list may not describe all possible side effects. Call your doctor for medical advice about side effects. You may report side effects to FDA at 1-800-FDA-1088. Where should I keep my medication? This drug is given in a hospital or clinic and will not be stored at home. NOTE: This sheet is a summary. It may not cover all possible information. If you have questions about this medicine, talk to your doctor, pharmacist, or health care provider.  2022 Elsevier/Gold Standard (2007-05-05 14:38:05)

## 2020-11-20 ENCOUNTER — Inpatient Hospital Stay: Payer: Medicare HMO

## 2020-11-20 ENCOUNTER — Encounter: Payer: Self-pay | Admitting: *Deleted

## 2020-11-20 ENCOUNTER — Telehealth: Payer: Self-pay | Admitting: *Deleted

## 2020-11-20 ENCOUNTER — Other Ambulatory Visit: Payer: Self-pay

## 2020-11-20 DIAGNOSIS — Z5111 Encounter for antineoplastic chemotherapy: Secondary | ICD-10-CM | POA: Diagnosis not present

## 2020-11-20 DIAGNOSIS — Z5112 Encounter for antineoplastic immunotherapy: Secondary | ICD-10-CM | POA: Diagnosis not present

## 2020-11-20 DIAGNOSIS — Z170 Estrogen receptor positive status [ER+]: Secondary | ICD-10-CM | POA: Diagnosis not present

## 2020-11-20 DIAGNOSIS — Z5189 Encounter for other specified aftercare: Secondary | ICD-10-CM | POA: Diagnosis not present

## 2020-11-20 DIAGNOSIS — Z452 Encounter for adjustment and management of vascular access device: Secondary | ICD-10-CM | POA: Diagnosis not present

## 2020-11-20 DIAGNOSIS — C50411 Malignant neoplasm of upper-outer quadrant of right female breast: Secondary | ICD-10-CM | POA: Diagnosis not present

## 2020-11-20 DIAGNOSIS — D6481 Anemia due to antineoplastic chemotherapy: Secondary | ICD-10-CM | POA: Diagnosis not present

## 2020-11-20 DIAGNOSIS — C773 Secondary and unspecified malignant neoplasm of axilla and upper limb lymph nodes: Secondary | ICD-10-CM | POA: Diagnosis not present

## 2020-11-20 MED ORDER — PEGFILGRASTIM-CBQV 6 MG/0.6ML ~~LOC~~ SOSY
6.0000 mg | PREFILLED_SYRINGE | Freq: Once | SUBCUTANEOUS | Status: AC
  Administered 2020-11-20: 6 mg via SUBCUTANEOUS
  Filled 2020-11-20: qty 0.6

## 2020-11-20 NOTE — Telephone Encounter (Signed)
-----   Message from Wylene Men, RN sent at 11/17/2020  5:02 PM EDT ----- Regarding: [place] Patient received 1st time [facility]P.  Tolerated well.  No s/s or c/o distress or discomfort.

## 2020-11-20 NOTE — Telephone Encounter (Signed)
Called pt to see how she did with her recent treatment.  She report doing well but had some nausea & diarrhea lost hs.  She reports feeling shaky & having brain fog.  She took imodium & nausea med which helped.  She had one diarrhea stool this am.  Reminded to take imodium as needed/per instructions.  Discussed nausea meds & ability to add in zofran after today to see if it helps better.  She reports eating & drinking well.  She took her claritin/tylenol this am before her pegfilgrastim.  She knows her next appt & how to reach [place] if needed.

## 2020-11-20 NOTE — Patient Instructions (Signed)

## 2020-11-22 NOTE — Progress Notes (Signed)
Patient Care Team: [name], MD as PCP - General (Internal Medicine) Mauro Kaufmann, RN as Oncology Nurse Navigator Rockwell Germany, RN as Oncology Nurse Navigator  DIAGNOSIS:    ICD-10-CM   1. Malignant neoplasm of upper-outer quadrant of right breast in female, estrogen receptor positive ([place])  C50.411    Z17.0       SUMMARY OF ONCOLOGIC HISTORY: Oncology History  Malignant neoplasm of upper-outer quadrant of right breast in female, estrogen receptor positive ([place])  10/04/2020 Initial Diagnosis   Palpable lump in the right breast, mammogram detected 3.6 cm mass with several axillary lymph nodes, biopsy revealed grade 1-2 IDC, lymph nodes positive for cancer, ER 40%, PR 0%, Ki-67 15%, HER2 positive 3+ by [facility]   10/31/2020 Cancer Staging   Staging form: Breast, AJCC 8th Edition - Clinical stage from 10/31/2020: Stage IIA (cT2, cN1, cM0, G2, ER+, PR-, HER2+) - Signed by Nicholas Lose, MD on 10/31/2020 Stage prefix: Initial diagnosis Histologic grading system: 3 grade system   11/17/2020 -  Chemotherapy   Patient is on Treatment Plan : BREAST  Docetaxel + Carboplatin + Trastuzumab + Pertuzumab  (TCHP) q21d        CHIEF COMPLIANT: Follow-up of right breast cancer  INTERVAL HISTORY: Lisa Chapman is a 75 y.o. with above-mentioned history of right breast cancer, currently on chemotherapy with TCHP. She presents to the clinic today for a toxicity check.  After first round of chemotherapy she did well for the first 3 days.  On fourth January she started having abdominal cramps and diarrhea.  She took several Imodium's which finally control the diarrhea.  Both abdominal cramps were quite severe.  She started also having slight nausea after that.  She took her nausea medication.  The diarrhea subsided with Imodium.  She also had some mouth sores which made it difficult to swallow food.  ALLERGIES:  is allergic to ibuprofen, other, boniva [ibandronic acid], and cortisone.  MEDICATIONS:   Current Outpatient Medications  Medication Sig Dispense Refill   dicyclomine (BENTYL) 10 MG capsule Take 1 capsule (10 mg total) by mouth 4 (four) times daily -  before meals and at bedtime. 30 capsule 1   nystatin (MYCOSTATIN) 100000 UNIT/ML suspension Take 5 mLs (500,000 Units total) by mouth 4 (four) times daily. 60 mL 3   acetaminophen (TYLENOL) 500 MG tablet Take 1,000 mg by mouth every 6 (six) hours as needed for mild pain.     dexamethasone (DECADRON) 4 MG tablet Take 1 tablet (4 mg total) by mouth daily. Take 1 tablet day before chemo and 1 tablet day after chemo with food 12 tablet 0   lidocaine-prilocaine (EMLA) cream Apply to affected area once 30 g 3   ondansetron (ZOFRAN) 8 MG tablet Take 1 tablet (8 mg total) by mouth 2 (two) times daily as needed (Nausea or vomiting). Start on the third day after chemotherapy. 30 tablet 1   prochlorperazine (COMPAZINE) 10 MG tablet Take 1 tablet (10 mg total) by mouth every 6 (six) hours as needed (Nausea or vomiting). 30 tablet 1   rosuvastatin (CRESTOR) 20 MG tablet Take 20 mg by mouth daily.     sodium chloride (OCEAN) 0.65 % SOLN nasal spray Place 1 spray into both nostrils as needed for congestion.     traMADol (ULTRAM) 50 MG tablet Take 1 tablet (50 mg total) by mouth every 6 (six) hours as needed for moderate pain or severe pain. 20 tablet 0   XARELTO 20  MG TABS tablet TAKE 1 TABLET (20 MG TOTAL) BY MOUTH DAILY WITH SUPPER. (Patient taking differently: Take 20 mg by mouth daily with supper.) 30 tablet 11   No current facility-administered medications for this visit.    PHYSICAL EXAMINATION: ECOG PERFORMANCE STATUS: 1 - Symptomatic but completely ambulatory  Vitals:   11/24/20 0959  BP: (!) 145/65  Pulse: 94  Resp: 18  Temp: (!) 97.3 F (36.3 C)  SpO2: 97%   Filed Weights   11/24/20 0959  Weight: 147 lb 4.8 oz (66.8 kg)      LABORATORY DATA:  I have reviewed the data as listed CMP Latest Ref Rng & Units 11/17/2020 10/27/2020   Glucose 70 - 99 mg/dL 102(H) 110(H)  BUN 8 - 23 mg/dL 12 9  Creatinine 0.44 - 1.00 mg/dL 0.72 0.61  Sodium 135 - 145 mmol/L 141 140  Potassium 3.5 - 5.1 mmol/L 4.1 3.8  Chloride 98 - 111 mmol/L 109 108  CO2 22 - 32 mmol/L 25 28  Calcium 8.9 - 10.3 mg/dL 9.3 9.3  Total Protein 6.5 - 8.1 g/dL 7.4 -  Total Bilirubin 0.3 - 1.2 mg/dL 0.4 -  Alkaline Phos 38 - 126 U/L 60 -  AST 15 - 41 U/L 17 -  ALT 0 - 44 U/L 9 -    Lab Results  Component Value Date   WBC 7.4 11/24/2020   HGB 11.7 (L) 11/24/2020   HCT 36.2 11/24/2020   MCV 82.8 11/24/2020   PLT 205 11/24/2020   NEUTROABS PENDING 11/24/2020    ASSESSMENT & PLAN:  Malignant neoplasm of upper-outer quadrant of right breast in female, estrogen receptor positive (HCC) Palpable lump in the right breast, mammogram detected 3.6 cm mass with several axillary lymph nodes, biopsy revealed grade 1-2 IDC, lymph nodes positive for cancer, ER 40%, PR 0%, Ki-67 15%, HER2 positive 3+ by IHC   Treatment plan: 1. Neoadjuvant chemotherapy with TCH Perjeta 6 cycles followed by Herceptin Perjeta maintenance versus Kadcyla maintenance (based on response to neoadjuvant chemo) for 1 year 2. Followed by breast conserving surgery if possible with sentinel lymph node study 3. Followed by adjuvant radiation therapy if patient had lumpectomy  __________________________________________________________________________________ Current treatment: Cycle 1 day 8 [place] site infection: Resolved Chemo toxicities: 1.  Diarrhea with abdominal cramps: Improved with Imodium.  I sent a prescription for Bentyl for the abdominal cramps 2. mouth sores: I sent a prescription for nystatin swish and swallow 3.  Mild nausea 4.  Bone pain due to Neulasta: Instructed her to take Motrin if necessary.  Return to clinic in 2 weeks for cycle 2    No orders of the defined types were placed in this encounter.  The patient has a good understanding of the overall plan.  she agrees with it. she will call with any problems that may develop before the next visit here.  Total time spent: 30 mins including face to face time and time spent for planning, charting and coordination of care  Rulon Eisenmenger, MD, MPH 11/24/2020  I, Thana Ates, am acting as scribe for Dr. Nicholas Lose.  I have reviewed the above documentation for accuracy and completeness, and I agree with the above.

## 2020-11-24 ENCOUNTER — Other Ambulatory Visit: Payer: Self-pay

## 2020-11-24 ENCOUNTER — Telehealth: Payer: Self-pay

## 2020-11-24 ENCOUNTER — Inpatient Hospital Stay: Payer: Medicare HMO

## 2020-11-24 ENCOUNTER — Encounter: Payer: Self-pay | Admitting: *Deleted

## 2020-11-24 ENCOUNTER — Encounter: Payer: Self-pay | Admitting: Hematology and Oncology

## 2020-11-24 ENCOUNTER — Inpatient Hospital Stay: Payer: Medicare HMO | Admitting: Hematology and Oncology

## 2020-11-24 DIAGNOSIS — Z5189 Encounter for other specified aftercare: Secondary | ICD-10-CM | POA: Diagnosis not present

## 2020-11-24 DIAGNOSIS — C50411 Malignant neoplasm of upper-outer quadrant of right female breast: Secondary | ICD-10-CM | POA: Diagnosis not present

## 2020-11-24 DIAGNOSIS — D6481 Anemia due to antineoplastic chemotherapy: Secondary | ICD-10-CM | POA: Diagnosis not present

## 2020-11-24 DIAGNOSIS — Z5111 Encounter for antineoplastic chemotherapy: Secondary | ICD-10-CM | POA: Diagnosis not present

## 2020-11-24 DIAGNOSIS — C773 Secondary and unspecified malignant neoplasm of axilla and upper limb lymph nodes: Secondary | ICD-10-CM | POA: Diagnosis not present

## 2020-11-24 DIAGNOSIS — Z170 Estrogen receptor positive status [ER+]: Secondary | ICD-10-CM | POA: Diagnosis not present

## 2020-11-24 DIAGNOSIS — Z452 Encounter for adjustment and management of vascular access device: Secondary | ICD-10-CM | POA: Diagnosis not present

## 2020-11-24 DIAGNOSIS — Z5112 Encounter for antineoplastic immunotherapy: Secondary | ICD-10-CM | POA: Diagnosis not present

## 2020-11-24 DIAGNOSIS — Z95828 Presence of other vascular implants and grafts: Secondary | ICD-10-CM

## 2020-11-24 LAB — CBC WITH DIFFERENTIAL (CANCER CENTER ONLY)
Abs Immature Granulocytes: 0.41 10*3/uL — ABNORMAL HIGH (ref 0.00–0.07)
Basophils Absolute: 0.1 10*3/uL (ref 0.0–0.1)
Basophils Relative: 1 %
Eosinophils Absolute: 0.1 10*3/uL (ref 0.0–0.5)
Eosinophils Relative: 1 %
HCT: 36.2 % (ref 36.0–46.0)
Hemoglobin: 11.7 g/dL — ABNORMAL LOW (ref 12.0–15.0)
Immature Granulocytes: 6 %
Lymphocytes Relative: 23 %
Lymphs Abs: 1.7 10*3/uL (ref 0.7–4.0)
MCH: 26.8 pg (ref 26.0–34.0)
MCHC: 32.3 g/dL (ref 30.0–36.0)
MCV: 82.8 fL (ref 80.0–100.0)
Monocytes Absolute: 1.8 10*3/uL — ABNORMAL HIGH (ref 0.1–1.0)
Monocytes Relative: 25 %
Neutro Abs: 3.3 10*3/uL (ref 1.7–7.7)
Neutrophils Relative %: 44 %
Platelet Count: 205 10*3/uL (ref 150–400)
RBC: 4.37 MIL/uL (ref 3.87–5.11)
RDW: 14.2 % (ref 11.5–15.5)
Smear Review: NORMAL
WBC Count: 7.4 10*3/uL (ref 4.0–10.5)
nRBC: 0 % (ref 0.0–0.2)

## 2020-11-24 LAB — CMP (CANCER CENTER ONLY)
ALT: 13 U/L (ref 0–44)
AST: 22 U/L (ref 15–41)
Albumin: 4 g/dL (ref 3.5–5.0)
Alkaline Phosphatase: 65 U/L (ref 38–126)
Anion gap: 10 (ref 5–15)
BUN: 11 mg/dL (ref 8–23)
CO2: 24 mmol/L (ref 22–32)
Calcium: 9 mg/dL (ref 8.9–10.3)
Chloride: 100 mmol/L (ref 98–111)
Creatinine: 0.79 mg/dL (ref 0.44–1.00)
GFR, Estimated: >60 mL/min (ref >60)
Glucose, Bld: 102 mg/dL — ABNORMAL HIGH (ref 70–99)
Potassium: 3.8 mmol/L (ref 3.5–5.1)
Sodium: 134 mmol/L — ABNORMAL LOW (ref 135–145)
Total Bilirubin: 0.5 mg/dL (ref 0.3–1.2)
Total Protein: 7.6 g/dL (ref 6.5–8.1)

## 2020-11-24 MED ORDER — DICYCLOMINE HCL 10 MG PO CAPS: 10.0000 mg | ORAL_CAPSULE | Freq: Three times a day (TID) | ORAL | 1 refills | Status: DC

## 2020-11-24 MED ORDER — SODIUM CHLORIDE 0.9% FLUSH
10.0000 mL | Freq: Once | INTRAVENOUS | Status: AC
  Administered 2020-11-24: 10 mL

## 2020-11-24 MED ORDER — NYSTATIN 100000 UNIT/ML MT SUSP: 5.0000 mL | Freq: Four times a day (QID) | OROMUCOSAL | 3 refills | Status: DC

## 2020-11-24 MED ORDER — HEPARIN SOD (PORK) LOCK FLUSH 100 UNIT/ML IV SOLN
500.0000 [IU] | Freq: Once | INTRAVENOUS | Status: AC
  Administered 2020-11-24: 500 [IU]

## 2020-11-24 NOTE — Telephone Encounter (Signed)
Notified Patient of Prior Authorization approval for Dicyclomine 10mg  capsules. Medication is approved through 02/10/2021.

## 2020-11-24 NOTE — Assessment & Plan Note (Signed)
Palpable lump in the right breast, mammogram detected 3.6 cm mass with several axillary lymph nodes, biopsy revealed grade 1-2 IDC, lymph nodes positive for cancer, ER 40%, PR 0%, Ki-67 15%, HER2 positive 3+ by IHC  Treatment plan: 1. Neoadjuvant chemotherapy with TCH Perjeta 6 cycles followed by Herceptin Perjeta maintenance versus Kadcyla maintenance (based on response to neoadjuvant chemo) for 1 year 2. Followed by breast conserving surgery if possible with sentinel lymph node study 3. Followed by adjuvant radiation therapy if patient had lumpectomy __________________________________________________________________________________ Current treatment: Cycle 1 day 8 [place] site infection: Resolved Chemo toxicities:  Return to clinic in 2 weeks for cycle 2

## 2020-11-24 NOTE — Progress Notes (Signed)
Met with patient at registration to obtain income for [employer].  Patient approved for one-time $1000 [employer] grant to assist with personal expenses while going through treatment. Discussed in detail expenses and how they are covered. She has a copy of the approval letter and expense sheet along with the Outpatient pharmacy information. She received a gift card today from the grant.  She has paperwork and my card in green folder for any additional financial questions or concerns.

## 2020-11-29 ENCOUNTER — Ambulatory Visit: Payer: Medicare HMO | Admitting: Hematology and Oncology

## 2020-11-29 ENCOUNTER — Telehealth: Payer: Self-pay | Admitting: *Deleted

## 2020-11-29 ENCOUNTER — Other Ambulatory Visit: Payer: Medicare HMO

## 2020-11-29 ENCOUNTER — Ambulatory Visit: Payer: Medicare HMO

## 2020-11-29 NOTE — Telephone Encounter (Signed)
DCP-001: Called patient to follow up on her interest in the DCP-001 study. Patient states she is interested but today is not a good day. She took my name and phone number to call back when she has time to discuss it further. Informed patient the deadline for participation is 12/04/20. She verbalized understanding. Thanked patient for her time and willingness to consider this study.  [name], BSN, RN Clinical Research Nurse 11/29/2020 3:38 PM

## 2020-12-01 ENCOUNTER — Ambulatory Visit: Payer: Medicare HMO

## 2020-12-06 DIAGNOSIS — Z0000 Encounter for general adult medical examination without abnormal findings: Secondary | ICD-10-CM | POA: Diagnosis not present

## 2020-12-06 DIAGNOSIS — E7800 Pure hypercholesterolemia, unspecified: Secondary | ICD-10-CM | POA: Diagnosis not present

## 2020-12-06 DIAGNOSIS — Z1339 Encounter for screening examination for other mental health and behavioral disorders: Secondary | ICD-10-CM | POA: Diagnosis not present

## 2020-12-06 DIAGNOSIS — Z1331 Encounter for screening for depression: Secondary | ICD-10-CM | POA: Diagnosis not present

## 2020-12-06 DIAGNOSIS — Z2821 Immunization not carried out because of patient refusal: Secondary | ICD-10-CM | POA: Diagnosis not present

## 2020-12-06 DIAGNOSIS — Z79899 Other long term (current) drug therapy: Secondary | ICD-10-CM | POA: Diagnosis not present

## 2020-12-06 DIAGNOSIS — Z7189 Other specified counseling: Secondary | ICD-10-CM | POA: Diagnosis not present

## 2020-12-06 DIAGNOSIS — Z299 Encounter for prophylactic measures, unspecified: Secondary | ICD-10-CM | POA: Diagnosis not present

## 2020-12-06 DIAGNOSIS — Z789 Other specified health status: Secondary | ICD-10-CM | POA: Diagnosis not present

## 2020-12-06 DIAGNOSIS — I10 Essential (primary) hypertension: Secondary | ICD-10-CM | POA: Diagnosis not present

## 2020-12-06 DIAGNOSIS — Z6825 Body mass index (BMI) 25.0-25.9, adult: Secondary | ICD-10-CM | POA: Diagnosis not present

## 2020-12-06 DIAGNOSIS — R5383 Other fatigue: Secondary | ICD-10-CM | POA: Diagnosis not present

## 2020-12-06 NOTE — Progress Notes (Signed)
And  Patient Care Team: [name], MD as PCP - General (Internal Medicine) Mauro Kaufmann, RN as Oncology Nurse Navigator Rockwell Germany, RN as Oncology Nurse Navigator  DIAGNOSIS:    ICD-10-CM   1. Malignant neoplasm of upper-outer quadrant of right breast in female, estrogen receptor positive ([place])  C50.411    Z17.0       SUMMARY OF ONCOLOGIC HISTORY: Oncology History  Malignant neoplasm of upper-outer quadrant of right breast in female, estrogen receptor positive ([place])  10/04/2020 Initial Diagnosis   Palpable lump in the right breast, mammogram detected 3.6 cm mass with several axillary lymph nodes, biopsy revealed grade 1-2 IDC, lymph nodes positive for cancer, ER 40%, PR 0%, Ki-67 15%, HER2 positive 3+ by [facility]   10/31/2020 Cancer Staging   Staging form: Breast, AJCC 8th Edition - Clinical stage from 10/31/2020: Stage IIA (cT2, cN1, cM0, G2, ER+, PR-, HER2+) - Signed by Nicholas Lose, MD on 10/31/2020 Stage prefix: Initial diagnosis Histologic grading system: 3 grade system    11/17/2020 -  Chemotherapy   Patient is on Treatment Plan : BREAST  Docetaxel + Carboplatin + Trastuzumab + Pertuzumab  (TCHP) q21d        CHIEF COMPLIANT: Cycle 2 TCH Perjeta   INTERVAL HISTORY: Lisa Chapman is a 75 y.o. with above-mentioned history of breast cancer, currently on chemotherapy with TCHP. She presents to the clinic today for treatment.   ALLERGIES:  is allergic to ibuprofen, other, boniva [ibandronic acid], and cortisone.  MEDICATIONS:  Current Outpatient Medications  Medication Sig Dispense Refill   acetaminophen (TYLENOL) 500 MG tablet Take 1,000 mg by mouth every 6 (six) hours as needed for mild pain.     dexamethasone (DECADRON) 4 MG tablet Take 1 tablet (4 mg total) by mouth daily. Take 1 tablet day before chemo and 1 tablet day after chemo with food 12 tablet 0   dicyclomine (BENTYL) 10 MG capsule Take 1 capsule (10 mg total) by mouth 4 (four) times daily -  before meals  and at bedtime. 30 capsule 1   lidocaine-prilocaine (EMLA) cream Apply to affected area once 30 g 3   nystatin (MYCOSTATIN) 100000 UNIT/ML suspension Take 5 mLs (500,000 Units total) by mouth 4 (four) times daily. 60 mL 3   ondansetron (ZOFRAN) 8 MG tablet Take 1 tablet (8 mg total) by mouth 2 (two) times daily as needed (Nausea or vomiting). Start on the third day after chemotherapy. 30 tablet 1   prochlorperazine (COMPAZINE) 10 MG tablet Take 1 tablet (10 mg total) by mouth every 6 (six) hours as needed (Nausea or vomiting). 30 tablet 1   rosuvastatin (CRESTOR) 20 MG tablet Take 20 mg by mouth daily.     sodium chloride (OCEAN) 0.65 % SOLN nasal spray Place 1 spray into both nostrils as needed for congestion.     traMADol (ULTRAM) 50 MG tablet Take 1 tablet (50 mg total) by mouth every 6 (six) hours as needed for moderate pain or severe pain. 20 tablet 0   XARELTO 20 MG TABS tablet TAKE 1 TABLET (20 MG TOTAL) BY MOUTH DAILY WITH SUPPER. (Patient taking differently: Take 20 mg by mouth daily with supper.) 30 tablet 11   No current facility-administered medications for this visit.    PHYSICAL EXAMINATION: ECOG PERFORMANCE STATUS: 1 - Symptomatic but completely ambulatory  Vitals:   12/07/20 0839  BP: (!) 125/52  Pulse: 76  Resp: 18  Temp: (!) 97.3 F (36.3 C)  SpO2: 97%  Filed Weights   12/07/20 0839  Weight: 146 lb 8 oz (66.5 kg)    LABORATORY DATA:  I have reviewed the data as listed CMP Latest Ref Rng & Units 11/24/2020 11/17/2020 10/27/2020  Glucose 70 - 99 mg/dL 102(H) 102(H) 110(H)  BUN 8 - 23 mg/dL 11 12 9   Creatinine 0.44 - 1.00 mg/dL 0.79 0.72 0.61  Sodium 135 - 145 mmol/L 134(L) 141 140  Potassium 3.5 - 5.1 mmol/L 3.8 4.1 3.8  Chloride 98 - 111 mmol/L 100 109 108  CO2 22 - 32 mmol/L 24 25 28   Calcium 8.9 - 10.3 mg/dL 9.0 9.3 9.3  Total Protein 6.5 - 8.1 g/dL 7.6 7.4 -  Total Bilirubin 0.3 - 1.2 mg/dL 0.5 0.4 -  Alkaline Phos 38 - 126 U/L 65 60 -  AST 15 - 41 U/L  22 17 -  ALT 0 - 44 U/L 13 9 -    Lab Results  Component Value Date   WBC 9.0 12/07/2020   HGB 10.9 (L) 12/07/2020   HCT 34.4 (L) 12/07/2020   MCV 84.7 12/07/2020   PLT 363 12/07/2020   NEUTROABS 7.0 12/07/2020    ASSESSMENT & PLAN:  Malignant neoplasm of upper-outer quadrant of right breast in female, estrogen receptor positive (HCC) Palpable lump in the right breast, mammogram detected 3.6 cm mass with several axillary lymph nodes, biopsy revealed grade 1-2 IDC, lymph nodes positive for cancer, ER 40%, PR 0%, Ki-67 15%, HER2 positive 3+ by IHC   Treatment plan: 1. Neoadjuvant chemotherapy with TCH Perjeta 6 cycles followed by Herceptin Perjeta maintenance versus Kadcyla maintenance (based on response to neoadjuvant chemo) for 1 year 2. Followed by breast conserving surgery if possible with sentinel lymph node study 3. Followed by adjuvant radiation therapy if patient had lumpectomy  __________________________________________________________________________________ Current treatment: Cycle 2 [place] site infection: Resolved Chemo toxicities: 1.  Diarrhea with abdominal cramps: Improved with Imodium.  And Bentyl for the abdominal cramps 2. mouth sores: Resolved with nystatin swish and swallow 3.  Mild nausea 4.  Bone pain due to Neulasta: 5.  Chemotherapy-induced anemia: Monitoring closely today's hemoglobin is 10.9   Return to clinic in 3 weeks for cycle 3    No orders of the defined types were placed in this encounter.  The patient has a good understanding of the overall plan. she agrees with it. she will call with any problems that may develop before the next visit here.  Total time spent: 30 mins including face to face time and time spent for planning, charting and coordination of care  Rulon Eisenmenger, MD, MPH 12/07/2020  I, Thana Ates, am acting as scribe for Dr. Nicholas Lose.  I have reviewed the above documentation for accuracy and completeness, and  I agree with the above.

## 2020-12-07 ENCOUNTER — Inpatient Hospital Stay: Payer: Medicare HMO | Admitting: Hematology and Oncology

## 2020-12-07 ENCOUNTER — Inpatient Hospital Stay: Payer: Medicare HMO

## 2020-12-07 ENCOUNTER — Other Ambulatory Visit: Payer: Self-pay

## 2020-12-07 VITALS — BP 114/61 | HR 75 | Resp 14

## 2020-12-07 DIAGNOSIS — Z5112 Encounter for antineoplastic immunotherapy: Secondary | ICD-10-CM | POA: Diagnosis not present

## 2020-12-07 DIAGNOSIS — Z170 Estrogen receptor positive status [ER+]: Secondary | ICD-10-CM | POA: Diagnosis not present

## 2020-12-07 DIAGNOSIS — C50411 Malignant neoplasm of upper-outer quadrant of right female breast: Secondary | ICD-10-CM

## 2020-12-07 DIAGNOSIS — Z95828 Presence of other vascular implants and grafts: Secondary | ICD-10-CM

## 2020-12-07 DIAGNOSIS — Z452 Encounter for adjustment and management of vascular access device: Secondary | ICD-10-CM | POA: Diagnosis not present

## 2020-12-07 DIAGNOSIS — D6481 Anemia due to antineoplastic chemotherapy: Secondary | ICD-10-CM | POA: Diagnosis not present

## 2020-12-07 DIAGNOSIS — Z5111 Encounter for antineoplastic chemotherapy: Secondary | ICD-10-CM | POA: Diagnosis not present

## 2020-12-07 DIAGNOSIS — C773 Secondary and unspecified malignant neoplasm of axilla and upper limb lymph nodes: Secondary | ICD-10-CM | POA: Diagnosis not present

## 2020-12-07 DIAGNOSIS — Z5189 Encounter for other specified aftercare: Secondary | ICD-10-CM | POA: Diagnosis not present

## 2020-12-07 LAB — CBC WITH DIFFERENTIAL (CANCER CENTER ONLY)
Abs Immature Granulocytes: 0.03 10*3/uL (ref 0.00–0.07)
Basophils Absolute: 0 10*3/uL (ref 0.0–0.1)
Basophils Relative: 0 %
Eosinophils Absolute: 0 10*3/uL (ref 0.0–0.5)
Eosinophils Relative: 0 %
HCT: 34.4 % — ABNORMAL LOW (ref 36.0–46.0)
Hemoglobin: 10.9 g/dL — ABNORMAL LOW (ref 12.0–15.0)
Immature Granulocytes: 0 %
Lymphocytes Relative: 14 %
Lymphs Abs: 1.3 10*3/uL (ref 0.7–4.0)
MCH: 26.8 pg (ref 26.0–34.0)
MCHC: 31.7 g/dL (ref 30.0–36.0)
MCV: 84.7 fL (ref 80.0–100.0)
Monocytes Absolute: 0.7 10*3/uL (ref 0.1–1.0)
Monocytes Relative: 8 %
Neutro Abs: 7 10*3/uL (ref 1.7–7.7)
Neutrophils Relative %: 78 %
Platelet Count: 363 10*3/uL (ref 150–400)
RBC: 4.06 MIL/uL (ref 3.87–5.11)
RDW: 14.8 % (ref 11.5–15.5)
WBC Count: 9 10*3/uL (ref 4.0–10.5)
nRBC: 0 % (ref 0.0–0.2)

## 2020-12-07 LAB — CMP (CANCER CENTER ONLY)
ALT: 13 U/L (ref 0–44)
AST: 15 U/L (ref 15–41)
Albumin: 3.6 g/dL (ref 3.5–5.0)
Alkaline Phosphatase: 67 U/L (ref 38–126)
Anion gap: 11 (ref 5–15)
BUN: 12 mg/dL (ref 8–23)
CO2: 22 mmol/L (ref 22–32)
Calcium: 9.3 mg/dL (ref 8.9–10.3)
Chloride: 110 mmol/L (ref 98–111)
Creatinine: 0.71 mg/dL (ref 0.44–1.00)
GFR, Estimated: >60 mL/min (ref >60)
Glucose, Bld: 105 mg/dL — ABNORMAL HIGH (ref 70–99)
Potassium: 4 mmol/L (ref 3.5–5.1)
Sodium: 143 mmol/L (ref 135–145)
Total Bilirubin: 0.3 mg/dL (ref 0.3–1.2)
Total Protein: 7.3 g/dL (ref 6.5–8.1)

## 2020-12-07 MED ORDER — SODIUM CHLORIDE 0.9 % IV SOLN
150.0000 mg | Freq: Once | INTRAVENOUS | Status: AC
  Administered 2020-12-07: 150 mg via INTRAVENOUS
  Filled 2020-12-07: qty 150

## 2020-12-07 MED ORDER — SODIUM CHLORIDE 0.9 % IV SOLN
420.0000 mg | Freq: Once | INTRAVENOUS | Status: AC
  Administered 2020-12-07: 420 mg via INTRAVENOUS
  Filled 2020-12-07: qty 14

## 2020-12-07 MED ORDER — SODIUM CHLORIDE 0.9% FLUSH
10.0000 mL | Freq: Once | INTRAVENOUS | Status: AC
  Administered 2020-12-07: 10 mL

## 2020-12-07 MED ORDER — SODIUM CHLORIDE 0.9 % IV SOLN
317.6000 mg | Freq: Once | INTRAVENOUS | Status: AC
  Administered 2020-12-07: 320 mg via INTRAVENOUS
  Filled 2020-12-07: qty 32

## 2020-12-07 MED ORDER — SODIUM CHLORIDE 0.9 % IV SOLN
10.0000 mg | Freq: Once | INTRAVENOUS | Status: AC
  Administered 2020-12-07: 10 mg via INTRAVENOUS
  Filled 2020-12-07: qty 10

## 2020-12-07 MED ORDER — PALONOSETRON HCL INJECTION 0.25 MG/5ML
0.2500 mg | Freq: Once | INTRAVENOUS | Status: AC
  Administered 2020-12-07: 0.25 mg via INTRAVENOUS
  Filled 2020-12-07: qty 5

## 2020-12-07 MED ORDER — SODIUM CHLORIDE 0.9 % IV SOLN: Freq: Once | INTRAVENOUS | Status: AC

## 2020-12-07 MED ORDER — TRASTUZUMAB-ANNS CHEMO 150 MG IV SOLR
6.0000 mg/kg | Freq: Once | INTRAVENOUS | Status: AC
  Administered 2020-12-07: 420 mg via INTRAVENOUS
  Filled 2020-12-07: qty 20

## 2020-12-07 MED ORDER — SODIUM CHLORIDE 0.9% FLUSH
10.0000 mL | INTRAVENOUS | Status: DC | PRN
  Administered 2020-12-07: 10 mL

## 2020-12-07 MED ORDER — ACETAMINOPHEN 325 MG PO TABS
650.0000 mg | ORAL_TABLET | Freq: Once | ORAL | Status: AC
  Administered 2020-12-07: 650 mg via ORAL
  Filled 2020-12-07: qty 2

## 2020-12-07 MED ORDER — HEPARIN SOD (PORK) LOCK FLUSH 100 UNIT/ML IV SOLN
500.0000 [IU] | Freq: Once | INTRAVENOUS | Status: AC | PRN
  Administered 2020-12-07: 500 [IU]

## 2020-12-07 MED ORDER — SODIUM CHLORIDE 0.9 % IV SOLN
65.0000 mg/m2 | Freq: Once | INTRAVENOUS | Status: AC
  Administered 2020-12-07: 110 mg via INTRAVENOUS
  Filled 2020-12-07: qty 11

## 2020-12-07 MED ORDER — DIPHENHYDRAMINE HCL 25 MG PO CAPS
25.0000 mg | ORAL_CAPSULE | Freq: Once | ORAL | Status: AC
  Administered 2020-12-07: 25 mg via ORAL
  Filled 2020-12-07: qty 1

## 2020-12-07 NOTE — Assessment & Plan Note (Signed)
Palpable lump in the right breast, mammogram detected 3.6 cm mass with several axillary lymph nodes, biopsy revealed grade 1-2 IDC, lymph nodes positive for cancer, ER 40%, PR 0%, Ki-67 15%, HER2 positive 3+ by IHC  Treatment plan: 1. Neoadjuvant chemotherapy with TCH Perjeta 6 cycles followed by Herceptin Perjeta maintenance versus Kadcyla maintenance (based on response to neoadjuvant chemo) for 1 year 2. Followed by breast conserving surgery if possible with sentinel lymph node study 3. Followed by adjuvant radiation therapy if patient had lumpectomy __________________________________________________________________________________ Current treatment:Cycle 2 [place] site infection:Resolved Chemo toxicities: 1.  Diarrhea with abdominal cramps: Improved with Imodium.  I sent a prescription for Bentyl for the abdominal cramps 2. mouth sores: I sent a prescription for nystatin swish and swallow 3.  Mild nausea 4.  Bone pain due to Neulasta: Instructed her to take Motrin if necessary.  Return to clinic in 3 weeks for cycle 3

## 2020-12-07 NOTE — Patient Instructions (Signed)
[facility]  Discharge Instructions: Thank you for choosing [facility] to provide your oncology and hematology care.   If you have a lab appointment with the [facility], please go directly to the [facility] and check in at the registration area.   Wear comfortable clothing and clothing appropriate for easy access to any Portacath or PICC line.   We strive to give you quality time with your provider. You may need to reschedule your appointment if you arrive late (15 or more minutes).  Arriving late affects you and other patients whose appointments are after yours.  Also, if you miss three or more appointments without notifying the office, you may be dismissed from the clinic at the provider's discretion.      For prescription refill requests, have your pharmacy contact our office and allow 72 hours for refills to be completed.    Today you received the following chemotherapy and/or immunotherapy agents tRASTUZUMAB/pERTUZUMAB/tAXOTERE/cARBOPLATIN      To help prevent nausea and vomiting after your treatment, we encourage you to take your nausea medication as directed.  BELOW ARE SYMPTOMS THAT SHOULD BE REPORTED IMMEDIATELY: *FEVER GREATER THAN 100.4 F (38 C) OR HIGHER *CHILLS OR SWEATING *NAUSEA AND VOMITING THAT IS NOT CONTROLLED WITH YOUR NAUSEA MEDICATION *UNUSUAL SHORTNESS OF BREATH *UNUSUAL BRUISING OR BLEEDING *URINARY PROBLEMS (pain or burning when urinating, or frequent urination) *BOWEL PROBLEMS (unusual diarrhea, constipation, pain near the anus) TENDERNESS IN MOUTH AND THROAT WITH OR WITHOUT PRESENCE OF ULCERS (sore throat, sores in mouth, or a toothache) UNUSUAL RASH, SWELLING OR PAIN  UNUSUAL VAGINAL DISCHARGE OR ITCHING   Items with * indicate a potential emergency and should be followed up as soon as possible or go to the Emergency Department if any problems should occur.  Please show the CHEMOTHERAPY ALERT CARD or  IMMUNOTHERAPY ALERT CARD at check-in to the Emergency Department and triage nurse.  Should you have questions after your visit or need to cancel or reschedule your appointment, please contact [facility]  Dept: [phone]  and follow the prompts.  Office hours are 8:00 a.m. to 4:30 p.m. Monday - Friday. Please note that voicemails left after 4:00 p.m. may not be returned until the following business day.  We are closed weekends and major holidays. You have access to a nurse at all times for urgent questions. Please call the main number to the clinic Dept: [phone] and follow the prompts.   For any non-urgent questions, you may also contact your provider using MyChart. We now offer e-Visits for anyone 36 and older to request care online for non-urgent symptoms. For details visit [email or web address].   Also download the MyChart app! Go to the app store, search "MyChart", open the app, select [facility], and log in with your MyChart username and password.  Due to Covid, a mask is required upon entering the hospital/clinic. If you do not have a mask, one will be given to you upon arrival. For doctor visits, patients may have 1 support person aged 42 or older with them. For treatment visits, patients cannot have anyone with them due to current Covid guidelines and our immunocompromised population.

## 2020-12-09 ENCOUNTER — Inpatient Hospital Stay: Payer: Medicare HMO

## 2020-12-09 ENCOUNTER — Other Ambulatory Visit: Payer: Self-pay

## 2020-12-09 VITALS — BP 143/71 | HR 64 | Temp 97.9°F | Resp 16

## 2020-12-09 DIAGNOSIS — C773 Secondary and unspecified malignant neoplasm of axilla and upper limb lymph nodes: Secondary | ICD-10-CM | POA: Diagnosis not present

## 2020-12-09 DIAGNOSIS — Z170 Estrogen receptor positive status [ER+]: Secondary | ICD-10-CM

## 2020-12-09 DIAGNOSIS — Z5111 Encounter for antineoplastic chemotherapy: Secondary | ICD-10-CM | POA: Diagnosis not present

## 2020-12-09 DIAGNOSIS — Z5189 Encounter for other specified aftercare: Secondary | ICD-10-CM | POA: Diagnosis not present

## 2020-12-09 DIAGNOSIS — C50411 Malignant neoplasm of upper-outer quadrant of right female breast: Secondary | ICD-10-CM

## 2020-12-09 DIAGNOSIS — Z452 Encounter for adjustment and management of vascular access device: Secondary | ICD-10-CM | POA: Diagnosis not present

## 2020-12-09 DIAGNOSIS — Z5112 Encounter for antineoplastic immunotherapy: Secondary | ICD-10-CM | POA: Diagnosis not present

## 2020-12-09 DIAGNOSIS — D6481 Anemia due to antineoplastic chemotherapy: Secondary | ICD-10-CM | POA: Diagnosis not present

## 2020-12-09 MED ORDER — PEGFILGRASTIM-CBQV 6 MG/0.6ML ~~LOC~~ SOSY
6.0000 mg | PREFILLED_SYRINGE | Freq: Once | SUBCUTANEOUS | Status: AC
  Administered 2020-12-09: 6 mg via SUBCUTANEOUS

## 2020-12-28 NOTE — Progress Notes (Signed)
Patient Care Team: Lisa Chroman, MD as PCP - General (Internal Medicine) Lisa Kaufmann, RN as Oncology Nurse Navigator Lisa Germany, RN as Oncology Nurse Navigator  DIAGNOSIS:    ICD-10-CM   1. Malignant neoplasm of upper-outer quadrant of right breast in female, estrogen receptor positive ([facility])  C50.411    Z17.0       SUMMARY OF ONCOLOGIC HISTORY: Oncology History  Malignant neoplasm of upper-outer quadrant of right breast in female, estrogen receptor positive ([place])  10/04/2020 Initial Diagnosis   Palpable lump in the right breast, mammogram detected 3.6 cm mass with several axillary lymph nodes, biopsy revealed grade 1-2 IDC, lymph nodes positive for cancer, ER 40%, PR 0%, Ki-67 15%, HER2 positive 3+ by [facility]   10/31/2020 Cancer Staging   Staging form: Breast, AJCC 8th Edition - Clinical stage from 10/31/2020: Stage IIA (cT2, cN1, cM0, G2, ER+, PR-, HER2+) - Signed by Nicholas Lose, MD on 10/31/2020 Stage prefix: Initial diagnosis Histologic grading system: 3 grade system    11/17/2020 -  Chemotherapy   Patient is on Treatment Plan : BREAST  Docetaxel + Carboplatin + Trastuzumab + Pertuzumab  (TCHP) q21d        CHIEF COMPLIANT: Cycle 3 TCH Perjeta   INTERVAL HISTORY: Lisa Chapman is a 75 y.o. with above-mentioned history of breast cancer, currently on chemotherapy with TCHP. She presents to the clinic today for treatment.  She did significantly better with cycle 2 of chemotherapy.  Very mild nausea.  Taste is a significant loss and she is having difficulty finding foods that the taste well.  Denies any further problems with bone pain.  ALLERGIES:  is allergic to ibuprofen, other, boniva [ibandronic acid], and cortisone.  MEDICATIONS:  Current Outpatient Medications  Medication Sig Dispense Refill   acetaminophen (TYLENOL) 500 MG tablet Take 1,000 mg by mouth every 6 (six) hours as needed for mild pain.     dexamethasone (DECADRON) 4 MG tablet Take 1 tablet (4 mg total)  by mouth daily. Take 1 tablet day before chemo and 1 tablet day after chemo with food 12 tablet 0   dicyclomine (BENTYL) 10 MG capsule Take 1 capsule (10 mg total) by mouth 4 (four) times daily -  before meals and at bedtime. 30 capsule 1   lidocaine-prilocaine (EMLA) cream Apply to affected area once 30 g 3   nystatin (MYCOSTATIN) 100000 UNIT/ML suspension Take 5 mLs (500,000 Units total) by mouth 4 (four) times daily. 60 mL 3   ondansetron (ZOFRAN) 8 MG tablet Take 1 tablet (8 mg total) by mouth 2 (two) times daily as needed (Nausea or vomiting). Start on the third day after chemotherapy. 30 tablet 1   prochlorperazine (COMPAZINE) 10 MG tablet Take 1 tablet (10 mg total) by mouth every 6 (six) hours as needed (Nausea or vomiting). 30 tablet 1   rosuvastatin (CRESTOR) 20 MG tablet Take 20 mg by mouth daily.     sodium chloride (OCEAN) 0.65 % SOLN nasal spray Place 1 spray into both nostrils as needed for congestion.     traMADol (ULTRAM) 50 MG tablet Take 1 tablet (50 mg total) by mouth every 6 (six) hours as needed for moderate pain or severe pain. 20 tablet 0   XARELTO 20 MG TABS tablet TAKE 1 TABLET (20 MG TOTAL) BY MOUTH DAILY WITH SUPPER. (Patient taking differently: Take 20 mg by mouth daily with supper.) 30 tablet 11   No current facility-administered medications for this visit.  PHYSICAL EXAMINATION: ECOG PERFORMANCE STATUS: 1 - Symptomatic but completely ambulatory  Vitals:   12/29/20 0844  BP: (!) 134/57  Pulse: 74  Resp: 16  SpO2: 100%   Filed Weights   12/29/20 0844  Weight: 145 lb 3.2 oz (65.9 kg)    LABORATORY DATA:  I have reviewed the data as listed CMP Latest Ref Rng & Units 12/07/2020 11/24/2020 11/17/2020  Glucose 70 - 99 mg/dL 105(H) 102(H) 102(H)  BUN 8 - 23 mg/dL _0 Creatinine 0.44 - 1.00 mg/dL 0.71 0.79 0.72  Sodium 135 - 145 mmol/L 143 134(L) 141  Potassium 3.5 - 5.1 mmol/L 4.0 3.8 4.1  Chloride 98 - 111 mmol/L 110 100 109  CO2 22 - 32 mmol/L _1 Calcium 8.9 - 10.3 mg/dL 9.3 9.0 9.3  Total Protein 6.5 - 8.1 g/dL 7.3 7.6 7.4  Total Bilirubin 0.3 - 1.2 mg/dL 0.3 0.5 0.4  Alkaline Phos 38 - 126 U/L 67 65 60  AST 15 - 41 U/L _2 ALT 0 - 44 U/L _3 Lab Results  Component Value Date   WBC 7.7 12/29/2020   HGB 10.1 (L) 12/29/2020   HCT 31.9 (L) 12/29/2020   MCV 85.3 12/29/2020   PLT 273 12/29/2020   NEUTROABS 4.7 12/29/2020    ASSESSMENT & PLAN:  Malignant neoplasm of upper-outer quadrant of right breast in female, estrogen receptor positive (HCC) Palpable lump in the right breast, mammogram detected 3.6 cm mass with several axillary lymph nodes, biopsy revealed grade 1-2 IDC, lymph nodes positive for cancer, ER 40%, PR 0%, Ki-67 15%, HER2 positive 3+ by IHC   Treatment plan: 1. Neoadjuvant chemotherapy with TCH Perjeta 6 cycles followed by Herceptin Perjeta maintenance versus Kadcyla maintenance (based on response to neoadjuvant chemo) for 1 year 2. Followed by breast conserving surgery if possible with sentinel lymph node study 3. Followed by adjuvant radiation therapy if patient had lumpectomy  __________________________________________________________________________________ Current treatment: Cycle 3 [place] site infection: Resolved Chemo toxicities: 1.  Diarrhea with abdominal cramps: Improved   2. mouth sores: Resolved with nystatin swish and swallow 3.  Mild nausea 4.  Bone pain due to Neulasta: Resolved 5.  Chemotherapy-induced anemia: Monitoring closely today's hemoglobin is 10.1   Return to clinic in 3 weeks for cycle 4    No orders of the defined types were placed in this encounter.  The patient has a good understanding of the overall plan. she agrees with it. she will call with any problems that may develop before the next visit here.  Total time spent: 30 mins including face to face time and time spent for planning, charting and coordination of care  Rulon Eisenmenger, MD,  MPH 12/29/2020  I, Thana Ates, am acting as scribe for Dr. Nicholas Lose.  I have reviewed the above documentation for accuracy and completeness, and I agree with the above.

## 2020-12-29 ENCOUNTER — Inpatient Hospital Stay: Payer: Medicare HMO

## 2020-12-29 ENCOUNTER — Other Ambulatory Visit: Payer: Self-pay

## 2020-12-29 ENCOUNTER — Inpatient Hospital Stay: Payer: Medicare HMO | Attending: Hematology and Oncology

## 2020-12-29 ENCOUNTER — Inpatient Hospital Stay: Payer: Medicare HMO | Admitting: Hematology and Oncology

## 2020-12-29 DIAGNOSIS — Z5189 Encounter for other specified aftercare: Secondary | ICD-10-CM | POA: Diagnosis not present

## 2020-12-29 DIAGNOSIS — C50411 Malignant neoplasm of upper-outer quadrant of right female breast: Secondary | ICD-10-CM

## 2020-12-29 DIAGNOSIS — Z95828 Presence of other vascular implants and grafts: Secondary | ICD-10-CM

## 2020-12-29 DIAGNOSIS — Z170 Estrogen receptor positive status [ER+]: Secondary | ICD-10-CM | POA: Insufficient documentation

## 2020-12-29 DIAGNOSIS — Z5111 Encounter for antineoplastic chemotherapy: Secondary | ICD-10-CM | POA: Insufficient documentation

## 2020-12-29 DIAGNOSIS — Z5112 Encounter for antineoplastic immunotherapy: Secondary | ICD-10-CM | POA: Insufficient documentation

## 2020-12-29 DIAGNOSIS — C773 Secondary and unspecified malignant neoplasm of axilla and upper limb lymph nodes: Secondary | ICD-10-CM | POA: Insufficient documentation

## 2020-12-29 DIAGNOSIS — D6481 Anemia due to antineoplastic chemotherapy: Secondary | ICD-10-CM | POA: Diagnosis not present

## 2020-12-29 LAB — CMP (CANCER CENTER ONLY)
ALT: 12 U/L (ref 0–44)
AST: 17 U/L (ref 15–41)
Albumin: 3.7 g/dL (ref 3.5–5.0)
Alkaline Phosphatase: 74 U/L (ref 38–126)
Anion gap: 7 (ref 5–15)
BUN: 13 mg/dL (ref 8–23)
CO2: 25 mmol/L (ref 22–32)
Calcium: 9.1 mg/dL (ref 8.9–10.3)
Chloride: 110 mmol/L (ref 98–111)
Creatinine: 0.71 mg/dL (ref 0.44–1.00)
GFR, Estimated: >60 mL/min (ref >60)
Glucose, Bld: 92 mg/dL (ref 70–99)
Potassium: 3.5 mmol/L (ref 3.5–5.1)
Sodium: 142 mmol/L (ref 135–145)
Total Bilirubin: 0.3 mg/dL (ref 0.3–1.2)
Total Protein: 7.2 g/dL (ref 6.5–8.1)

## 2020-12-29 LAB — CBC WITH DIFFERENTIAL (CANCER CENTER ONLY)
Abs Immature Granulocytes: 0.03 10*3/uL (ref 0.00–0.07)
Basophils Absolute: 0 10*3/uL (ref 0.0–0.1)
Basophils Relative: 1 %
Eosinophils Absolute: 0 10*3/uL (ref 0.0–0.5)
Eosinophils Relative: 0 %
HCT: 31.9 % — ABNORMAL LOW (ref 36.0–46.0)
Hemoglobin: 10.1 g/dL — ABNORMAL LOW (ref 12.0–15.0)
Immature Granulocytes: 0 %
Lymphocytes Relative: 24 %
Lymphs Abs: 1.9 10*3/uL (ref 0.7–4.0)
MCH: 27 pg (ref 26.0–34.0)
MCHC: 31.7 g/dL (ref 30.0–36.0)
MCV: 85.3 fL (ref 80.0–100.0)
Monocytes Absolute: 1 10*3/uL (ref 0.1–1.0)
Monocytes Relative: 13 %
Neutro Abs: 4.7 10*3/uL (ref 1.7–7.7)
Neutrophils Relative %: 62 %
Platelet Count: 273 10*3/uL (ref 150–400)
RBC: 3.74 MIL/uL — ABNORMAL LOW (ref 3.87–5.11)
RDW: 16.8 % — ABNORMAL HIGH (ref 11.5–15.5)
WBC Count: 7.7 10*3/uL (ref 4.0–10.5)
nRBC: 0 % (ref 0.0–0.2)

## 2020-12-29 MED ORDER — SODIUM CHLORIDE 0.9 % IV SOLN: Freq: Once | INTRAVENOUS | Status: AC

## 2020-12-29 MED ORDER — DIPHENHYDRAMINE HCL 25 MG PO CAPS
25.0000 mg | ORAL_CAPSULE | Freq: Once | ORAL | Status: AC
  Administered 2020-12-29: 25 mg via ORAL
  Filled 2020-12-29: qty 1

## 2020-12-29 MED ORDER — SODIUM CHLORIDE 0.9 % IV SOLN
65.0000 mg/m2 | Freq: Once | INTRAVENOUS | Status: AC
  Administered 2020-12-29: 110 mg via INTRAVENOUS
  Filled 2020-12-29: qty 11

## 2020-12-29 MED ORDER — SODIUM CHLORIDE 0.9% FLUSH
10.0000 mL | Freq: Once | INTRAVENOUS | Status: AC
  Administered 2020-12-29: 10 mL

## 2020-12-29 MED ORDER — SODIUM CHLORIDE 0.9 % IV SOLN
150.0000 mg | Freq: Once | INTRAVENOUS | Status: AC
  Administered 2020-12-29: 150 mg via INTRAVENOUS
  Filled 2020-12-29: qty 150

## 2020-12-29 MED ORDER — ACETAMINOPHEN 325 MG PO TABS
650.0000 mg | ORAL_TABLET | Freq: Once | ORAL | Status: AC
  Administered 2020-12-29: 650 mg via ORAL
  Filled 2020-12-29: qty 2

## 2020-12-29 MED ORDER — SODIUM CHLORIDE 0.9 % IV SOLN
317.6000 mg | Freq: Once | INTRAVENOUS | Status: AC
  Administered 2020-12-29: 320 mg via INTRAVENOUS
  Filled 2020-12-29: qty 32

## 2020-12-29 MED ORDER — HEPARIN SOD (PORK) LOCK FLUSH 100 UNIT/ML IV SOLN
500.0000 [IU] | Freq: Once | INTRAVENOUS | Status: AC | PRN
  Administered 2020-12-29: 500 [IU]

## 2020-12-29 MED ORDER — PALONOSETRON HCL INJECTION 0.25 MG/5ML
0.2500 mg | Freq: Once | INTRAVENOUS | Status: AC
  Administered 2020-12-29: 0.25 mg via INTRAVENOUS
  Filled 2020-12-29: qty 5

## 2020-12-29 MED ORDER — SODIUM CHLORIDE 0.9 % IV SOLN
420.0000 mg | Freq: Once | INTRAVENOUS | Status: AC
  Administered 2020-12-29: 420 mg via INTRAVENOUS
  Filled 2020-12-29: qty 14

## 2020-12-29 MED ORDER — SODIUM CHLORIDE 0.9 % IV SOLN
10.0000 mg | Freq: Once | INTRAVENOUS | Status: AC
  Administered 2020-12-29: 10 mg via INTRAVENOUS
  Filled 2020-12-29: qty 10

## 2020-12-29 MED ORDER — SODIUM CHLORIDE 0.9% FLUSH
10.0000 mL | INTRAVENOUS | Status: DC | PRN
  Administered 2020-12-29: 10 mL

## 2020-12-29 MED ORDER — TRASTUZUMAB-ANNS CHEMO 150 MG IV SOLR
6.0000 mg/kg | Freq: Once | INTRAVENOUS | Status: AC
  Administered 2020-12-29: 420 mg via INTRAVENOUS
  Filled 2020-12-29: qty 20

## 2020-12-29 NOTE — Patient Instructions (Signed)
[facility]   Discharge Instructions: Thank you for choosing [facility] to provide your oncology and hematology care.   If you have a lab appointment with the [facility], please go directly to the [facility] and check in at the registration area.   Wear comfortable clothing and clothing appropriate for easy access to any Portacath or PICC line.   We strive to give you quality time with your provider. You may need to reschedule your appointment if you arrive late (15 or more minutes).  Arriving late affects you and other patients whose appointments are after yours.  Also, if you miss three or more appointments without notifying the office, you may be dismissed from the clinic at the provider's discretion.      For prescription refill requests, have your pharmacy contact our office and allow 72 hours for refills to be completed.    Today you received the following chemotherapy and/or immunotherapy agents: trastuzumab, pertuzumab, docetaxel, and carboplatin.      To help prevent nausea and vomiting after your treatment, we encourage you to take your nausea medication as directed.  BELOW ARE SYMPTOMS THAT SHOULD BE REPORTED IMMEDIATELY: *FEVER GREATER THAN 100.4 F (38 C) OR HIGHER *CHILLS OR SWEATING *NAUSEA AND VOMITING THAT IS NOT CONTROLLED WITH YOUR NAUSEA MEDICATION *UNUSUAL SHORTNESS OF BREATH *UNUSUAL BRUISING OR BLEEDING *URINARY PROBLEMS (pain or burning when urinating, or frequent urination) *BOWEL PROBLEMS (unusual diarrhea, constipation, pain near the anus) TENDERNESS IN MOUTH AND THROAT WITH OR WITHOUT PRESENCE OF ULCERS (sore throat, sores in mouth, or a toothache) UNUSUAL RASH, SWELLING OR PAIN  UNUSUAL VAGINAL DISCHARGE OR ITCHING   Items with * indicate a potential emergency and should be followed up as soon as possible or go to the Emergency Department if any problems should occur.  Please show the CHEMOTHERAPY ALERT  CARD or IMMUNOTHERAPY ALERT CARD at check-in to the Emergency Department and triage nurse.  Should you have questions after your visit or need to cancel or reschedule your appointment, please contact [facility]  Dept: [phone]  and follow the prompts.  Office hours are 8:00 a.m. to 4:30 p.m. Monday - Friday. Please note that voicemails left after 4:00 p.m. may not be returned until the following business day.  We are closed weekends and major holidays. You have access to a nurse at all times for urgent questions. Please call the main number to the clinic Dept: [phone] and follow the prompts.   For any non-urgent questions, you may also contact your provider using MyChart. We now offer e-Visits for anyone 66 and older to request care online for non-urgent symptoms. For details visit [email or web address].   Also download the MyChart app! Go to the app store, search "MyChart", open the app, select [facility], and log in with your MyChart username and password.  Due to Covid, a mask is required upon entering the hospital/clinic. If you do not have a mask, one will be given to you upon arrival. For doctor visits, patients may have 1 support person aged 65 or older with them. For treatment visits, patients cannot have anyone with them due to current Covid guidelines and our immunocompromised population.

## 2020-12-29 NOTE — Assessment & Plan Note (Signed)
Palpable lump in the right breast, mammogram detected 3.6 cm mass with several axillary lymph nodes, biopsy revealed grade 1-2 IDC, lymph nodes positive for cancer, ER 40%, PR 0%, Ki-67 15%, HER2 positive 3+ by IHC  Treatment plan: 1. Neoadjuvant chemotherapy with TCH Perjeta 6 cycles followed by Herceptin Perjeta maintenance versus Kadcyla maintenance (based on response to neoadjuvant chemo) for 1 year 2. Followed by breast conserving surgery if possible with sentinel lymph node study 3. Followed by adjuvant radiation therapy if patient had lumpectomy __________________________________________________________________________________ Current treatment:Cycle 3TCH Perj[place] site infection:Resolved Chemo toxicities: 1.Diarrhea with abdominal cramps: Improved with Imodium.  And Bentyl for the abdominal cramps 2.mouth sores: Resolved with nystatin swish and swallow 3.Mild nausea 4.Bone pain due to Neulasta: 5.  Chemotherapy-induced anemia: Monitoring closely today's hemoglobin is 10.9  Return to clinic in3 weeks for cycle 4

## 2021-01-01 ENCOUNTER — Inpatient Hospital Stay: Payer: Medicare HMO

## 2021-01-01 ENCOUNTER — Other Ambulatory Visit: Payer: Self-pay

## 2021-01-01 VITALS — BP 141/71 | HR 90 | Temp 98.8°F | Resp 18

## 2021-01-01 DIAGNOSIS — Z5189 Encounter for other specified aftercare: Secondary | ICD-10-CM | POA: Diagnosis not present

## 2021-01-01 DIAGNOSIS — Z5112 Encounter for antineoplastic immunotherapy: Secondary | ICD-10-CM | POA: Diagnosis not present

## 2021-01-01 DIAGNOSIS — Z170 Estrogen receptor positive status [ER+]: Secondary | ICD-10-CM | POA: Diagnosis not present

## 2021-01-01 DIAGNOSIS — C773 Secondary and unspecified malignant neoplasm of axilla and upper limb lymph nodes: Secondary | ICD-10-CM | POA: Diagnosis not present

## 2021-01-01 DIAGNOSIS — D6481 Anemia due to antineoplastic chemotherapy: Secondary | ICD-10-CM | POA: Diagnosis not present

## 2021-01-01 DIAGNOSIS — C50411 Malignant neoplasm of upper-outer quadrant of right female breast: Secondary | ICD-10-CM

## 2021-01-01 DIAGNOSIS — Z5111 Encounter for antineoplastic chemotherapy: Secondary | ICD-10-CM | POA: Diagnosis not present

## 2021-01-01 MED ORDER — PEGFILGRASTIM-CBQV 6 MG/0.6ML ~~LOC~~ SOSY
6.0000 mg | PREFILLED_SYRINGE | Freq: Once | SUBCUTANEOUS | Status: AC
  Administered 2021-01-01: 6 mg via SUBCUTANEOUS
  Filled 2021-01-01: qty 0.6

## 2021-01-01 NOTE — Patient Instructions (Signed)

## 2021-01-15 ENCOUNTER — Other Ambulatory Visit: Payer: Self-pay | Admitting: Internal Medicine

## 2021-01-15 ENCOUNTER — Inpatient Hospital Stay: Admission: RE | Admit: 2021-01-15 | Payer: Medicare HMO | Source: Ambulatory Visit

## 2021-01-15 DIAGNOSIS — Z1231 Encounter for screening mammogram for malignant neoplasm of breast: Secondary | ICD-10-CM

## 2021-01-18 MED FILL — Dexamethasone Sodium Phosphate Inj 100 MG/10ML: INTRAMUSCULAR | Qty: 1 | Status: AC

## 2021-01-18 MED FILL — Fosaprepitant Dimeglumine For IV Infusion 150 MG (Base Eq): INTRAVENOUS | Qty: 5 | Status: AC

## 2021-01-19 ENCOUNTER — Inpatient Hospital Stay: Payer: Medicare HMO | Attending: Hematology and Oncology

## 2021-01-19 ENCOUNTER — Encounter: Payer: Self-pay | Admitting: Adult Health

## 2021-01-19 ENCOUNTER — Inpatient Hospital Stay: Payer: Medicare HMO

## 2021-01-19 ENCOUNTER — Inpatient Hospital Stay (HOSPITAL_BASED_OUTPATIENT_CLINIC_OR_DEPARTMENT_OTHER): Payer: Medicare HMO | Admitting: Adult Health

## 2021-01-19 ENCOUNTER — Other Ambulatory Visit: Payer: Self-pay

## 2021-01-19 VITALS — BP 139/67 | HR 74 | Temp 97.9°F | Resp 20 | Ht 62.0 in | Wt 146.0 lb

## 2021-01-19 DIAGNOSIS — C50411 Malignant neoplasm of upper-outer quadrant of right female breast: Secondary | ICD-10-CM | POA: Diagnosis not present

## 2021-01-19 DIAGNOSIS — Z170 Estrogen receptor positive status [ER+]: Secondary | ICD-10-CM | POA: Diagnosis not present

## 2021-01-19 DIAGNOSIS — Z5111 Encounter for antineoplastic chemotherapy: Secondary | ICD-10-CM | POA: Insufficient documentation

## 2021-01-19 DIAGNOSIS — Z95828 Presence of other vascular implants and grafts: Secondary | ICD-10-CM

## 2021-01-19 DIAGNOSIS — K521 Toxic gastroenteritis and colitis: Secondary | ICD-10-CM | POA: Diagnosis not present

## 2021-01-19 DIAGNOSIS — D6481 Anemia due to antineoplastic chemotherapy: Secondary | ICD-10-CM | POA: Diagnosis not present

## 2021-01-19 DIAGNOSIS — R109 Unspecified abdominal pain: Secondary | ICD-10-CM | POA: Diagnosis not present

## 2021-01-19 DIAGNOSIS — Z5112 Encounter for antineoplastic immunotherapy: Secondary | ICD-10-CM | POA: Diagnosis present

## 2021-01-19 DIAGNOSIS — T451X5A Adverse effect of antineoplastic and immunosuppressive drugs, initial encounter: Secondary | ICD-10-CM | POA: Insufficient documentation

## 2021-01-19 LAB — CBC WITH DIFFERENTIAL (CANCER CENTER ONLY)
Abs Immature Granulocytes: 0.03 10*3/uL (ref 0.00–0.07)
Basophils Absolute: 0 10*3/uL (ref 0.0–0.1)
Basophils Relative: 1 %
Eosinophils Absolute: 0 10*3/uL (ref 0.0–0.5)
Eosinophils Relative: 0 %
HCT: 30.4 % — ABNORMAL LOW (ref 36.0–46.0)
Hemoglobin: 9.8 g/dL — ABNORMAL LOW (ref 12.0–15.0)
Immature Granulocytes: 0 %
Lymphocytes Relative: 31 %
Lymphs Abs: 2.2 10*3/uL (ref 0.7–4.0)
MCH: 28 pg (ref 26.0–34.0)
MCHC: 32.2 g/dL (ref 30.0–36.0)
MCV: 86.9 fL (ref 80.0–100.0)
Monocytes Absolute: 0.9 10*3/uL (ref 0.1–1.0)
Monocytes Relative: 12 %
Neutro Abs: 4 10*3/uL (ref 1.7–7.7)
Neutrophils Relative %: 56 %
Platelet Count: 279 10*3/uL (ref 150–400)
RBC: 3.5 MIL/uL — ABNORMAL LOW (ref 3.87–5.11)
RDW: 17.3 % — ABNORMAL HIGH (ref 11.5–15.5)
WBC Count: 7.2 10*3/uL (ref 4.0–10.5)
nRBC: 0 % (ref 0.0–0.2)

## 2021-01-19 LAB — CMP (CANCER CENTER ONLY)
ALT: 11 U/L (ref 0–44)
AST: 16 U/L (ref 15–41)
Albumin: 3.6 g/dL (ref 3.5–5.0)
Alkaline Phosphatase: 74 U/L (ref 38–126)
Anion gap: 6 (ref 5–15)
BUN: 12 mg/dL (ref 8–23)
CO2: 25 mmol/L (ref 22–32)
Calcium: 8.6 mg/dL — ABNORMAL LOW (ref 8.9–10.3)
Chloride: 111 mmol/L (ref 98–111)
Creatinine: 0.67 mg/dL (ref 0.44–1.00)
GFR, Estimated: >60 mL/min (ref >60)
Glucose, Bld: 90 mg/dL (ref 70–99)
Potassium: 3.5 mmol/L (ref 3.5–5.1)
Sodium: 142 mmol/L (ref 135–145)
Total Bilirubin: 0.2 mg/dL — ABNORMAL LOW (ref 0.3–1.2)
Total Protein: 6.8 g/dL (ref 6.5–8.1)

## 2021-01-19 MED ORDER — SODIUM CHLORIDE 0.9% FLUSH
10.0000 mL | Freq: Once | INTRAVENOUS | Status: AC
  Administered 2021-01-19: 10 mL

## 2021-01-19 MED ORDER — SODIUM CHLORIDE 0.9 % IV SOLN
10.0000 mg | Freq: Once | INTRAVENOUS | Status: AC
  Administered 2021-01-19: 10 mg via INTRAVENOUS
  Filled 2021-01-19: qty 10

## 2021-01-19 MED ORDER — HEPARIN SOD (PORK) LOCK FLUSH 100 UNIT/ML IV SOLN
500.0000 [IU] | Freq: Once | INTRAVENOUS | Status: AC | PRN
  Administered 2021-01-19: 500 [IU]

## 2021-01-19 MED ORDER — FOSAPREPITANT DIMEGLUMINE INJECTION 150 MG
150.0000 mg | Freq: Once | INTRAVENOUS | Status: AC
  Administered 2021-01-19: 150 mg via INTRAVENOUS
  Filled 2021-01-19: qty 150

## 2021-01-19 MED ORDER — SODIUM CHLORIDE 0.9 % IV SOLN
Freq: Once | INTRAVENOUS | Status: AC
Start: 2021-01-19 — End: 2021-01-19

## 2021-01-19 MED ORDER — SODIUM CHLORIDE 0.9 % IV SOLN
317.6000 mg | Freq: Once | INTRAVENOUS | Status: AC
  Administered 2021-01-19: 320 mg via INTRAVENOUS
  Filled 2021-01-19: qty 32

## 2021-01-19 MED ORDER — SODIUM CHLORIDE 0.9 % IV SOLN
65.0000 mg/m2 | Freq: Once | INTRAVENOUS | Status: AC
  Administered 2021-01-19: 110 mg via INTRAVENOUS
  Filled 2021-01-19: qty 11

## 2021-01-19 MED ORDER — SODIUM CHLORIDE 0.9% FLUSH
10.0000 mL | INTRAVENOUS | Status: DC | PRN
  Administered 2021-01-19: 10 mL

## 2021-01-19 MED ORDER — DIPHENHYDRAMINE HCL 25 MG PO CAPS
25.0000 mg | ORAL_CAPSULE | Freq: Once | ORAL | Status: AC
  Administered 2021-01-19: 25 mg via ORAL
  Filled 2021-01-19: qty 1

## 2021-01-19 MED ORDER — PALONOSETRON HCL INJECTION 0.25 MG/5ML
0.2500 mg | Freq: Once | INTRAVENOUS | Status: AC
  Administered 2021-01-19: 0.25 mg via INTRAVENOUS
  Filled 2021-01-19: qty 5

## 2021-01-19 MED ORDER — TRASTUZUMAB-ANNS CHEMO 150 MG IV SOLR
6.0000 mg/kg | Freq: Once | INTRAVENOUS | Status: AC
  Administered 2021-01-19: 420 mg via INTRAVENOUS
  Filled 2021-01-19: qty 20

## 2021-01-19 MED ORDER — SODIUM CHLORIDE 0.9 % IV SOLN
420.0000 mg | Freq: Once | INTRAVENOUS | Status: AC
  Administered 2021-01-19: 420 mg via INTRAVENOUS
  Filled 2021-01-19: qty 14

## 2021-01-19 MED ORDER — ACETAMINOPHEN 325 MG PO TABS
650.0000 mg | ORAL_TABLET | Freq: Once | ORAL | Status: AC
  Administered 2021-01-19: 650 mg via ORAL
  Filled 2021-01-19: qty 2

## 2021-01-19 NOTE — Progress Notes (Signed)
[facility] Follow up:    Lisa Chroman, MD [address] [place] [address]   DIAGNOSIS:  Cancer Staging  Malignant neoplasm of upper-outer quadrant of right breast in female, estrogen receptor positive ([facility]) Staging form: Breast, AJCC 8th Edition - Clinical stage from 10/31/2020: Stage IIA (cT2, cN1, cM0, G2, ER+, PR-, HER2+) - Signed by Nicholas Lose, MD on 10/31/2020 Stage prefix: Initial diagnosis Histologic grading system: 3 grade system   SUMMARY OF ONCOLOGIC HISTORY: Oncology History  Malignant neoplasm of upper-outer quadrant of right breast in female, estrogen receptor positive ([facility])  10/04/2020 Initial Diagnosis   Palpable lump in the right breast, mammogram detected 3.6 cm mass with several axillary lymph nodes, biopsy revealed grade 1-2 IDC, lymph nodes positive for cancer, ER 40%, PR 0%, Ki-67 15%, HER2 positive 3+ by [facility]   10/31/2020 Cancer Staging   Staging form: Breast, AJCC 8th Edition - Clinical stage from 10/31/2020: Stage IIA (cT2, cN1, cM0, G2, ER+, PR-, HER2+) - Signed by Nicholas Lose, MD on 10/31/2020 Stage prefix: Initial diagnosis Histologic grading system: 3 grade system    11/17/2020 -  Chemotherapy   Patient is on Treatment Plan : BREAST  Docetaxel + Carboplatin + Trastuzumab + Pertuzumab  (TCHP) q21d        CURRENT THERAPY: TCHP  INTERVAL HISTORY: Lisa Chapman 75 y.o. female returns for evaluation prior to receiving her fourth cycle of TCHP.  She says she is tolerating this well.  She denies peripheral neuropathy.  She has diarrhea for a few days following her treatment and this occurs approximately 4-5 times and is managed with imodium.     Patient Active Problem List   Diagnosis Date Noted   Port-A-Cath in place 11/17/2020   Malignant neoplasm of upper-outer quadrant of right breast in female, estrogen receptor positive ([facility]) 10/31/2020   DVT (deep venous thrombosis) ([facility]) 03/22/2019   Paroxysmal supraventricular tachycardia  ([facility]) 03/08/2014   Cardiac murmur 03/08/2014   Mixed hyperlipidemia 03/08/2014    is allergic to ibuprofen, other, boniva [ibandronic acid], and cortisone.  MEDICAL HISTORY: Past Medical History:  Diagnosis Date   Cancer ([facility]) 2022   Breast Cancer   Diverticulosis    Dysrhythmia    Heart murmur    History of colonic polyps    History of DVT (deep vein thrombosis)    Right leg, April 2015   History of shingles    Mixed hyperlipidemia     SURGICAL HISTORY: Past Surgical History:  Procedure Laterality Date   HERNIA REPAIR     MENISCUS REPAIR Left 03/2019   PORTACATH PLACEMENT Left 11/03/2020   Procedure: INSERTION PORT-A-CATH;  Surgeon: Coralie Keens, MD;  Location: [facility] OR;  Service: General;  Laterality: Left;   TONSILLECTOMY      SOCIAL HISTORY: Social History   Socioeconomic History   Marital status: Married    Spouse name: Not on file   Number of children: Not on file   Years of education: Not on file   Highest education level: Not on file  Occupational History   Not on file  Tobacco Use   Smoking status: Never   Smokeless tobacco: Never  Vaping Use   Vaping Use: Never used  Substance and Sexual Activity   Alcohol use: No    Alcohol/week: 0.0 standard drinks   Drug use: No   Sexual activity: Not on file  Other Topics Concern   Not on file  Social History Narrative   Married. No  children   Social Determinants of Health   Financial Resource Strain: Not on file  Food Insecurity: Not on file  Transportation Needs: Not on file  Physical Activity: Not on file  Stress: Not on file  Social Connections: Not on file  Intimate Partner Violence: Not on file    FAMILY HISTORY: Family History  Problem Relation Age of Onset   Heart attack Mother    COPD Father    Heart attack Father    Emphysema Father     Review of Systems  Constitutional:  Positive for fatigue (mild). Negative for appetite change, chills, fever and unexpected weight change.  HENT:    Negative for hearing loss, lump/mass and trouble swallowing.   Eyes:  Negative for eye problems and icterus.  Respiratory:  Negative for chest tightness, cough and shortness of breath.   Cardiovascular:  Negative for chest pain, leg swelling and palpitations.  Gastrointestinal:  Negative for abdominal distention, abdominal pain, constipation, diarrhea, nausea and vomiting.  Endocrine: Negative for hot flashes.  Genitourinary:  Negative for difficulty urinating.   Musculoskeletal:  Negative for arthralgias.  Skin:  Negative for itching and rash.  Neurological:  Negative for dizziness, extremity weakness, headaches and numbness.  Hematological:  Negative for adenopathy. Does not bruise/bleed easily.  Psychiatric/Behavioral:  Negative for depression. The patient is not nervous/anxious.      PHYSICAL EXAMINATION  ECOG PERFORMANCE STATUS: 1 - Symptomatic but completely ambulatory  Vitals:   01/19/21 0906  BP: 139/67  Pulse: 74  Resp: 20  Temp: 97.9 F (36.6 C)  SpO2: 97%    Physical Exam Constitutional:      General: She is not in acute distress.    Appearance: Normal appearance. She is not toxic-appearing.  HENT:     Head: Normocephalic and atraumatic.  Eyes:     General: No scleral icterus. Cardiovascular:     Rate and Rhythm: Normal rate and regular rhythm.     Pulses: Normal pulses.     Heart sounds: Normal heart sounds.  Pulmonary:     Effort: Pulmonary effort is normal.     Breath sounds: Normal breath sounds.  Abdominal:     General: Abdomen is flat. Bowel sounds are normal. There is no distension.     Palpations: Abdomen is soft.     Tenderness: There is no abdominal tenderness.  Musculoskeletal:        General: No swelling.     Cervical back: Neck supple.  Lymphadenopathy:     Cervical: No cervical adenopathy.  Skin:    General: Skin is warm and dry.     Findings: No rash.  Neurological:     General: No focal deficit present.     Mental Status: She is  alert.  Psychiatric:        Mood and Affect: Mood normal.        Behavior: Behavior normal.    LABORATORY DATA:  CBC    Component Value Date/Time   WBC 7.2 01/19/2021 0850   WBC 6.6 10/27/2020 1023   RBC 3.50 (L) 01/19/2021 0850   HGB 9.8 (L) 01/19/2021 0850   HCT 30.4 (L) 01/19/2021 0850   PLT 279 01/19/2021 0850   MCV 86.9 01/19/2021 0850   MCH 28.0 01/19/2021 0850   MCHC 32.2 01/19/2021 0850   RDW 17.3 (H) 01/19/2021 0850   LYMPHSABS 2.2 01/19/2021 0850   MONOABS 0.9 01/19/2021 0850   EOSABS 0.0 01/19/2021 0850   BASOSABS 0.0 01/19/2021 0850  CMP     Component Value Date/Time   NA 142 01/19/2021 0850   K 3.5 01/19/2021 0850   CL 111 01/19/2021 0850   CO2 25 01/19/2021 0850   GLUCOSE 90 01/19/2021 0850   BUN 12 01/19/2021 0850   CREATININE 0.67 01/19/2021 0850   CALCIUM 8.6 (L) 01/19/2021 0850   PROT 6.8 01/19/2021 0850   ALBUMIN 3.6 01/19/2021 0850   AST 16 01/19/2021 0850   ALT 11 01/19/2021 0850   ALKPHOS 74 01/19/2021 0850   BILITOT 0.2 (L) 01/19/2021 0850   GFRNONAA >60 01/19/2021 0850     ASSESSMENT and PLAN:   Malignant neoplasm of upper-outer quadrant of right breast in female, estrogen receptor positive (HCC) Palpable lump in the right breast, mammogram detected 3.6 cm mass with several axillary lymph nodes, biopsy revealed grade 1-2 IDC, lymph nodes positive for cancer, ER 40%, PR 0%, Ki-67 15%, HER2 positive 3+ by IHC   Treatment plan: 1. Neoadjuvant chemotherapy with TCH Perjeta 6 cycles followed by Herceptin Perjeta maintenance versus Kadcyla maintenance (based on response to neoadjuvant chemo) for 1 year 2. Followed by breast conserving surgery if possible with sentinel lymph node study 3. Followed by adjuvant radiation therapy if patient had lumpectomy  __________________________________________________________________________________ Current treatment: Cycle 4 [place] site infection: Resolved Chemo toxicities: 1.  Diarrhea  with abdominal cramps: Improved with Imodium.  And Bentyl for the abdominal cramps 2. mouth sores: Resolved with nystatin swish and swallow 3.  Mild nausea 4.  Bone pain due to Neulasta: 5.  Chemotherapy-induced anemia: Monitoring closely, remains above 10  I placed orders for her MRI, coordinated cycle 6 of treatment, and ordered her echo.   Return to clinic in 3 weeks for cycle 5   All questions were answered. The patient knows to call the clinic with any problems, questions or concerns. We can certainly see the patient much sooner if necessary.  Total encounter time: 30 minutes in face to face visit time, chart review, lab review, care coordination, order entry, and documentation of the encounter.    Wilber Bihari, NP 01/19/21 10:06 AM Medical Oncology and Hematology [facility] [place], [place] [address] Tel. [phone]    Fax. [phone]  *Total Encounter Time as defined by the Centers for Medicare and Medicaid Services includes, in addition to the face-to-face time of a patient visit (documented in the note above) non-face-to-face time: obtaining and reviewing outside history, ordering and reviewing medications, tests or procedures, care coordination (communications with other health care professionals or caregivers) and documentation in the medical record.

## 2021-01-19 NOTE — Assessment & Plan Note (Addendum)
Palpable lump in the right breast, mammogram detected 3.6 cm mass with several axillary lymph nodes, biopsy revealed grade 1-2 IDC, lymph nodes positive for cancer, ER 40%, PR 0%, Ki-67 15%, HER2 positive 3+ by IHC  Treatment plan: 1. Neoadjuvant chemotherapy with TCH Perjeta 6 cycles followed by Herceptin Perjeta maintenance versus Kadcyla maintenance (based on response to neoadjuvant chemo) for 1 year 2. Followed by breast conserving surgery if possible with sentinel lymph node study 3. Followed by adjuvant radiation therapy if patient had lumpectomy __________________________________________________________________________________ Current treatment:Cycle [place] site infection:Resolved Chemo toxicities: 1.Diarrhea with abdominal cramps: Improved with Imodium.  And Bentyl for the abdominal cramps 2.mouth sores: Resolved with nystatin swish and swallow 3.Mild nausea 4.Bone pain due to Neulasta: 5.  Chemotherapy-induced anemia: Monitoring closely, remains above 10  I placed orders for her MRI, coordinated cycle 6 of treatment, and ordered her echo.   Return to clinic in3 weeks for cycle 5

## 2021-01-19 NOTE — Patient Instructions (Signed)
[facility]   Discharge Instructions: Thank you for choosing [facility] to provide your oncology and hematology care.   If you have a lab appointment with the [facility], please go directly to the [facility] and check in at the registration area.   Wear comfortable clothing and clothing appropriate for easy access to any Portacath or PICC line.   We strive to give you quality time with your provider. You may need to reschedule your appointment if you arrive late (15 or more minutes).  Arriving late affects you and other patients whose appointments are after yours.  Also, if you miss three or more appointments without notifying the office, you may be dismissed from the clinic at the provider's discretion.      For prescription refill requests, have your pharmacy contact our office and allow 72 hours for refills to be completed.    Today you received the following chemotherapy and/or immunotherapy agents: trastuzumab, pertuzumab, docetaxel, and carboplatin.      To help prevent nausea and vomiting after your treatment, we encourage you to take your nausea medication as directed.  BELOW ARE SYMPTOMS THAT SHOULD BE REPORTED IMMEDIATELY: *FEVER GREATER THAN 100.4 F (38 C) OR HIGHER *CHILLS OR SWEATING *NAUSEA AND VOMITING THAT IS NOT CONTROLLED WITH YOUR NAUSEA MEDICATION *UNUSUAL SHORTNESS OF BREATH *UNUSUAL BRUISING OR BLEEDING *URINARY PROBLEMS (pain or burning when urinating, or frequent urination) *BOWEL PROBLEMS (unusual diarrhea, constipation, pain near the anus) TENDERNESS IN MOUTH AND THROAT WITH OR WITHOUT PRESENCE OF ULCERS (sore throat, sores in mouth, or a toothache) UNUSUAL RASH, SWELLING OR PAIN  UNUSUAL VAGINAL DISCHARGE OR ITCHING   Items with * indicate a potential emergency and should be followed up as soon as possible or go to the Emergency Department if any problems should occur.  Please show the CHEMOTHERAPY ALERT  CARD or IMMUNOTHERAPY ALERT CARD at check-in to the Emergency Department and triage nurse.  Should you have questions after your visit or need to cancel or reschedule your appointment, please contact [facility]  Dept: [phone]  and follow the prompts.  Office hours are 8:00 a.m. to 4:30 p.m. Monday - Friday. Please note that voicemails left after 4:00 p.m. may not be returned until the following business day.  We are closed weekends and major holidays. You have access to a nurse at all times for urgent questions. Please call the main number to the clinic Dept: [phone] and follow the prompts.   For any non-urgent questions, you may also contact your provider using MyChart. We now offer e-Visits for anyone 40 and older to request care online for non-urgent symptoms. For details visit [email or web address].   Also download the MyChart app! Go to the app store, search "MyChart", open the app, select [facility], and log in with your MyChart username and password.  Due to Covid, a mask is required upon entering the hospital/clinic. If you do not have a mask, one will be given to you upon arrival. For doctor visits, patients may have 1 support person aged 67 or older with them. For treatment visits, patients cannot have anyone with them due to current Covid guidelines and our immunocompromised population.

## 2021-01-22 ENCOUNTER — Inpatient Hospital Stay: Payer: Medicare HMO

## 2021-01-22 ENCOUNTER — Other Ambulatory Visit: Payer: Self-pay

## 2021-01-22 VITALS — BP 130/79 | HR 91 | Temp 98.3°F | Resp 18

## 2021-01-22 DIAGNOSIS — Z170 Estrogen receptor positive status [ER+]: Secondary | ICD-10-CM

## 2021-01-22 DIAGNOSIS — K521 Toxic gastroenteritis and colitis: Secondary | ICD-10-CM | POA: Diagnosis not present

## 2021-01-22 DIAGNOSIS — Z5112 Encounter for antineoplastic immunotherapy: Secondary | ICD-10-CM | POA: Diagnosis not present

## 2021-01-22 DIAGNOSIS — R109 Unspecified abdominal pain: Secondary | ICD-10-CM | POA: Diagnosis not present

## 2021-01-22 DIAGNOSIS — Z5111 Encounter for antineoplastic chemotherapy: Secondary | ICD-10-CM | POA: Diagnosis not present

## 2021-01-22 DIAGNOSIS — C50411 Malignant neoplasm of upper-outer quadrant of right female breast: Secondary | ICD-10-CM

## 2021-01-22 DIAGNOSIS — D6481 Anemia due to antineoplastic chemotherapy: Secondary | ICD-10-CM | POA: Diagnosis not present

## 2021-01-22 DIAGNOSIS — T451X5A Adverse effect of antineoplastic and immunosuppressive drugs, initial encounter: Secondary | ICD-10-CM | POA: Diagnosis not present

## 2021-01-22 MED ORDER — PEGFILGRASTIM-CBQV 6 MG/0.6ML ~~LOC~~ SOSY
6.0000 mg | PREFILLED_SYRINGE | Freq: Once | SUBCUTANEOUS | Status: AC
  Administered 2021-01-22: 6 mg via SUBCUTANEOUS
  Filled 2021-01-22: qty 0.6

## 2021-02-08 ENCOUNTER — Encounter: Payer: Self-pay | Admitting: *Deleted

## 2021-02-08 NOTE — Progress Notes (Signed)
Patient Care Team: [name], MD as PCP - General (Internal Medicine) Mauro Kaufmann, RN as Oncology Nurse Navigator Rockwell Germany, RN as Oncology Nurse Navigator Nicholas Lose, MD as Consulting Physician (Hematology and Oncology) Coralie Keens, MD as Consulting Physician (General Surgery)  DIAGNOSIS:    ICD-10-CM   1. Malignant neoplasm of upper-outer quadrant of right breast in female, estrogen receptor positive ([place])  C50.411    Z17.0       SUMMARY OF ONCOLOGIC HISTORY: Oncology History  Malignant neoplasm of upper-outer quadrant of right breast in female, estrogen receptor positive ([place])  10/04/2020 Initial Diagnosis   Palpable lump in the right breast, mammogram detected 3.6 cm mass with several axillary lymph nodes, biopsy revealed grade 1-2 IDC, lymph nodes positive for cancer, ER 40%, PR 0%, Ki-67 15%, HER2 positive 3+ by [facility]   10/31/2020 Cancer Staging   Staging form: Breast, AJCC 8th Edition - Clinical stage from 10/31/2020: Stage IIA (cT2, cN1, cM0, G2, ER+, PR-, HER2+) - Signed by Nicholas Lose, MD on 10/31/2020 Stage prefix: Initial diagnosis Histologic grading system: 3 grade system    11/17/2020 -  Chemotherapy   Patient is on Treatment Plan : BREAST  Docetaxel + Carboplatin + Trastuzumab + Pertuzumab  (TCHP) q21d        CHIEF COMPLIANT: Cycle 5 TCH Perjeta   INTERVAL HISTORY: Lisa Chapman is a 75 y.o. with above-mentioned history of breast cancer, currently on chemotherapy with TCHP. She presents to the clinic today for treatment.  She has mild peripheral neuropathy in the tips of the fingers but otherwise tolerating treatment extremely well.  Does not have any nausea or vomiting.  ALLERGIES:  is allergic to ibuprofen, other, boniva [ibandronic acid], and cortisone.  MEDICATIONS:  Current Outpatient Medications  Medication Sig Dispense Refill   acetaminophen (TYLENOL) 500 MG tablet Take 1,000 mg by mouth every 6 (six) hours as needed for mild  pain.     dexamethasone (DECADRON) 4 MG tablet Take 1 tablet (4 mg total) by mouth daily. Take 1 tablet day before chemo and 1 tablet day after chemo with food 12 tablet 0   dicyclomine (BENTYL) 10 MG capsule Take 1 capsule (10 mg total) by mouth 4 (four) times daily -  before meals and at bedtime. 30 capsule 1   lidocaine-prilocaine (EMLA) cream Apply to affected area once 30 g 3   nystatin (MYCOSTATIN) 100000 UNIT/ML suspension Take 5 mLs (500,000 Units total) by mouth 4 (four) times daily. 60 mL 3   ondansetron (ZOFRAN) 8 MG tablet Take 1 tablet (8 mg total) by mouth 2 (two) times daily as needed (Nausea or vomiting). Start on the third day after chemotherapy. 30 tablet 1   prochlorperazine (COMPAZINE) 10 MG tablet Take 1 tablet (10 mg total) by mouth every 6 (six) hours as needed (Nausea or vomiting). 30 tablet 1   rosuvastatin (CRESTOR) 20 MG tablet Take 20 mg by mouth daily.     sodium chloride (OCEAN) 0.65 % SOLN nasal spray Place 1 spray into both nostrils as needed for congestion.     traMADol (ULTRAM) 50 MG tablet Take 1 tablet (50 mg total) by mouth every 6 (six) hours as needed for moderate pain or severe pain. 20 tablet 0   XARELTO 20 MG TABS tablet TAKE 1 TABLET (20 MG TOTAL) BY MOUTH DAILY WITH SUPPER. (Patient taking differently: Take 20 mg by mouth daily with supper.) 30 tablet 11   No current facility-administered medications for this  visit.    PHYSICAL EXAMINATION: ECOG PERFORMANCE STATUS: 1 - Symptomatic but completely ambulatory  Vitals:   02/09/21 0919  BP: (!) 141/69  Pulse: 79  Resp: 18  Temp: 97.6 F (36.4 C)  SpO2: 99%   Filed Weights   02/09/21 0919  Weight: 141 lb 1 oz (64 kg)    LABORATORY DATA:  I have reviewed the data as listed CMP Latest Ref Rng & Units 02/09/2021 01/19/2021 12/29/2020  Glucose 70 - 99 mg/dL 96 90 92  BUN 8 - 23 mg/dL _0 Creatinine 0.44 - 1.00 mg/dL 0.74 0.67 0.71  Sodium 135 - 145 mmol/L 143 142 142  Potassium 3.5 - 5.1  mmol/L 3.8 3.5 3.5  Chloride 98 - 111 mmol/L 109 111 110  CO2 22 - 32 mmol/L _1 Calcium 8.9 - 10.3 mg/dL 9.3 8.6(L) 9.1  Total Protein 6.5 - 8.1 g/dL 6.9 6.8 7.2  Total Bilirubin 0.3 - 1.2 mg/dL 0.3 0.2(L) 0.3  Alkaline Phos 38 - 126 U/L 62 74 74  AST 15 - 41 U/L _2 ALT 0 - 44 U/L _3 Lab Results  Component Value Date   WBC 6.8 02/09/2021   HGB 9.5 (L) 02/09/2021   HCT 30.2 (L) 02/09/2021   MCV 88.3 02/09/2021   PLT 281 02/09/2021   NEUTROABS 3.5 02/09/2021    ASSESSMENT & PLAN:  Malignant neoplasm of upper-outer quadrant of right breast in female, estrogen receptor positive (HCC) Palpable lump in the right breast, mammogram detected 3.6 cm mass with several axillary lymph nodes, biopsy revealed grade 1-2 IDC, lymph nodes positive for cancer, ER 40%, PR 0%, Ki-67 15%, HER2 positive 3+ by IHC   Treatment plan: 1. Neoadjuvant chemotherapy with TCH Perjeta 6 cycles followed by Herceptin Perjeta maintenance versus Kadcyla maintenance (based on response to neoadjuvant chemo) for 1 year 2. Followed by breast conserving surgery if possible with sentinel lymph node study 3. Followed by adjuvant radiation therapy if patient had lumpectomy  __________________________________________________________________________________ Current treatment: Cycle 5 [place] site infection: Resolved  Chemo toxicities: 1.  Diarrhea with abdominal cramps: Improved with Imodium.  And Bentyl for the abdominal cramps 2. mouth sores: Resolved with nystatin swish and swallow 3.  Mild nausea 4.  Bone pain due to Neulasta: 5.  Chemotherapy-induced anemia: Monitoring closely today's hemoglobin is 9.5   Breast MRI has been scheduled for 03/05/2021 Return to clinic in 3 weeks for cycle 6    No orders of the defined types were placed in this encounter.  The patient has a good understanding of the overall plan. she agrees with it. she will call with any problems that may develop  before the next visit here.  Total time spent: 30 mins including face to face time and time spent for planning, charting and coordination of care  Rulon Eisenmenger, MD, MPH 02/09/2021  I, Thana Ates, am acting as scribe for Dr. Nicholas Lose.  I have reviewed the above documentation for accuracy and completeness, and I agree with the above.

## 2021-02-09 ENCOUNTER — Inpatient Hospital Stay: Payer: Medicare HMO

## 2021-02-09 ENCOUNTER — Other Ambulatory Visit: Payer: Self-pay

## 2021-02-09 ENCOUNTER — Inpatient Hospital Stay (HOSPITAL_BASED_OUTPATIENT_CLINIC_OR_DEPARTMENT_OTHER): Payer: Medicare HMO | Admitting: Hematology and Oncology

## 2021-02-09 VITALS — BP 128/71 | HR 70 | Temp 98.6°F | Resp 18

## 2021-02-09 DIAGNOSIS — C50411 Malignant neoplasm of upper-outer quadrant of right female breast: Secondary | ICD-10-CM

## 2021-02-09 DIAGNOSIS — Z170 Estrogen receptor positive status [ER+]: Secondary | ICD-10-CM | POA: Diagnosis not present

## 2021-02-09 DIAGNOSIS — R109 Unspecified abdominal pain: Secondary | ICD-10-CM | POA: Diagnosis not present

## 2021-02-09 DIAGNOSIS — K521 Toxic gastroenteritis and colitis: Secondary | ICD-10-CM | POA: Diagnosis not present

## 2021-02-09 DIAGNOSIS — Z5112 Encounter for antineoplastic immunotherapy: Secondary | ICD-10-CM | POA: Diagnosis not present

## 2021-02-09 DIAGNOSIS — T451X5A Adverse effect of antineoplastic and immunosuppressive drugs, initial encounter: Secondary | ICD-10-CM | POA: Diagnosis not present

## 2021-02-09 DIAGNOSIS — D6481 Anemia due to antineoplastic chemotherapy: Secondary | ICD-10-CM | POA: Diagnosis not present

## 2021-02-09 DIAGNOSIS — Z5111 Encounter for antineoplastic chemotherapy: Secondary | ICD-10-CM | POA: Diagnosis not present

## 2021-02-09 DIAGNOSIS — Z95828 Presence of other vascular implants and grafts: Secondary | ICD-10-CM

## 2021-02-09 LAB — CMP (CANCER CENTER ONLY)
ALT: 11 U/L (ref 0–44)
AST: 17 U/L (ref 15–41)
Albumin: 4 g/dL (ref 3.5–5.0)
Alkaline Phosphatase: 62 U/L (ref 38–126)
Anion gap: 6 (ref 5–15)
BUN: 15 mg/dL (ref 8–23)
CO2: 28 mmol/L (ref 22–32)
Calcium: 9.3 mg/dL (ref 8.9–10.3)
Chloride: 109 mmol/L (ref 98–111)
Creatinine: 0.74 mg/dL (ref 0.44–1.00)
GFR, Estimated: >60 mL/min (ref >60)
Glucose, Bld: 96 mg/dL (ref 70–99)
Potassium: 3.8 mmol/L (ref 3.5–5.1)
Sodium: 143 mmol/L (ref 135–145)
Total Bilirubin: 0.3 mg/dL (ref 0.3–1.2)
Total Protein: 6.9 g/dL (ref 6.5–8.1)

## 2021-02-09 LAB — CBC WITH DIFFERENTIAL (CANCER CENTER ONLY)
Abs Immature Granulocytes: 0.01 10*3/uL (ref 0.00–0.07)
Basophils Absolute: 0 10*3/uL (ref 0.0–0.1)
Basophils Relative: 0 %
Eosinophils Absolute: 0.1 10*3/uL (ref 0.0–0.5)
Eosinophils Relative: 2 %
HCT: 30.2 % — ABNORMAL LOW (ref 36.0–46.0)
Hemoglobin: 9.5 g/dL — ABNORMAL LOW (ref 12.0–15.0)
Immature Granulocytes: 0 %
Lymphocytes Relative: 34 %
Lymphs Abs: 2.3 10*3/uL (ref 0.7–4.0)
MCH: 27.8 pg (ref 26.0–34.0)
MCHC: 31.5 g/dL (ref 30.0–36.0)
MCV: 88.3 fL (ref 80.0–100.0)
Monocytes Absolute: 0.9 10*3/uL (ref 0.1–1.0)
Monocytes Relative: 14 %
Neutro Abs: 3.5 10*3/uL (ref 1.7–7.7)
Neutrophils Relative %: 50 %
Platelet Count: 281 10*3/uL (ref 150–400)
RBC: 3.42 MIL/uL — ABNORMAL LOW (ref 3.87–5.11)
RDW: 17.4 % — ABNORMAL HIGH (ref 11.5–15.5)
WBC Count: 6.8 10*3/uL (ref 4.0–10.5)
nRBC: 0 % (ref 0.0–0.2)

## 2021-02-09 MED ORDER — HEPARIN SOD (PORK) LOCK FLUSH 100 UNIT/ML IV SOLN
500.0000 [IU] | Freq: Once | INTRAVENOUS | Status: AC | PRN
  Administered 2021-02-09: 15:00:00 500 [IU]

## 2021-02-09 MED ORDER — SODIUM CHLORIDE 0.9 % IV SOLN
317.6000 mg | Freq: Once | INTRAVENOUS | Status: AC
  Administered 2021-02-09: 14:00:00 320 mg via INTRAVENOUS
  Filled 2021-02-09: qty 32

## 2021-02-09 MED ORDER — SODIUM CHLORIDE 0.9% FLUSH
10.0000 mL | Freq: Once | INTRAVENOUS | Status: AC
  Administered 2021-02-09: 09:00:00 10 mL

## 2021-02-09 MED ORDER — FOSAPREPITANT DIMEGLUMINE INJECTION 150 MG
150.0000 mg | Freq: Once | INTRAVENOUS | Status: AC
  Administered 2021-02-09: 11:00:00 150 mg via INTRAVENOUS
  Filled 2021-02-09: qty 150

## 2021-02-09 MED ORDER — SODIUM CHLORIDE 0.9 % IV SOLN
10.0000 mg | Freq: Once | INTRAVENOUS | Status: AC
  Administered 2021-02-09: 11:00:00 10 mg via INTRAVENOUS
  Filled 2021-02-09: qty 10

## 2021-02-09 MED ORDER — PALONOSETRON HCL INJECTION 0.25 MG/5ML
0.2500 mg | Freq: Once | INTRAVENOUS | Status: AC
  Administered 2021-02-09: 10:00:00 0.25 mg via INTRAVENOUS
  Filled 2021-02-09: qty 5

## 2021-02-09 MED ORDER — SODIUM CHLORIDE 0.9 % IV SOLN: Freq: Once | INTRAVENOUS | Status: AC

## 2021-02-09 MED ORDER — TRASTUZUMAB-ANNS CHEMO 150 MG IV SOLR
6.0000 mg/kg | Freq: Once | INTRAVENOUS | Status: AC
  Administered 2021-02-09: 11:00:00 420 mg via INTRAVENOUS
  Filled 2021-02-09: qty 20

## 2021-02-09 MED ORDER — ACETAMINOPHEN 325 MG PO TABS
650.0000 mg | ORAL_TABLET | Freq: Once | ORAL | Status: AC
  Administered 2021-02-09: 10:00:00 650 mg via ORAL
  Filled 2021-02-09: qty 2

## 2021-02-09 MED ORDER — SODIUM CHLORIDE 0.9% FLUSH
10.0000 mL | INTRAVENOUS | Status: DC | PRN
  Administered 2021-02-09: 15:00:00 10 mL

## 2021-02-09 MED ORDER — SODIUM CHLORIDE 0.9 % IV SOLN
65.0000 mg/m2 | Freq: Once | INTRAVENOUS | Status: AC
  Administered 2021-02-09: 13:00:00 110 mg via INTRAVENOUS
  Filled 2021-02-09: qty 11

## 2021-02-09 MED ORDER — DIPHENHYDRAMINE HCL 25 MG PO CAPS
25.0000 mg | ORAL_CAPSULE | Freq: Once | ORAL | Status: AC
  Administered 2021-02-09: 10:00:00 25 mg via ORAL
  Filled 2021-02-09: qty 1

## 2021-02-09 MED ORDER — SODIUM CHLORIDE 0.9 % IV SOLN
420.0000 mg | Freq: Once | INTRAVENOUS | Status: AC
  Administered 2021-02-09: 12:00:00 420 mg via INTRAVENOUS
  Filled 2021-02-09: qty 14

## 2021-02-09 NOTE — Assessment & Plan Note (Signed)
Palpable lump in the right breast, mammogram detected 3.6 cm mass with several axillary lymph nodes, biopsy revealed grade 1-2 IDC, lymph nodes positive for cancer, ER 40%, PR 0%, Ki-67 15%, HER2 positive 3+ by IHC  Treatment plan: 1. Neoadjuvant chemotherapy with TCH Perjeta 6 cycles followed by Herceptin Perjeta maintenance versus Kadcyla maintenance (based on response to neoadjuvant chemo) for 1 year 2. Followed by breast conserving surgery if possible with sentinel lymph node study 3. Followed by adjuvant radiation therapy if patient had lumpectomy __________________________________________________________________________________ Current treatment:Cycle5TCH Perjeta [place] site infection:Resolved  Chemo toxicities: 1.Diarrhea with abdominal cramps: Improved with Imodium.AndBentyl for the abdominal cramps 2.mouth sores:Resolved withnystatin swish and swallow 3.Mild nausea 4.Bone pain due to Neulasta: 5.Chemotherapy-induced anemia: Monitoring closely, remains above 10  Breast MRI has been scheduled for 03/05/2021 Return to clinic in3weeks for cycle 6

## 2021-02-09 NOTE — Patient Instructions (Signed)
[facility]  Discharge Instructions: Thank you for choosing [facility] to provide your oncology and hematology care.   If you have a lab appointment with the [facility], please go directly to the [facility] and check in at the registration area.   Wear comfortable clothing and clothing appropriate for easy access to any Portacath or PICC line.   We strive to give you quality time with your provider. You may need to reschedule your appointment if you arrive late (15 or more minutes).  Arriving late affects you and other patients whose appointments are after yours.  Also, if you miss three or more appointments without notifying the office, you may be dismissed from the clinic at the providers discretion.      For prescription refill requests, have your pharmacy contact our office and allow 72 hours for refills to be completed.    Today you received the following chemotherapy and/or immunotherapy agents: Trastuzumab, Pertuzumab, Docetaxel, Carboplatin.       To help prevent nausea and vomiting after your treatment, we encourage you to take your nausea medication as directed.  BELOW ARE SYMPTOMS THAT SHOULD BE REPORTED IMMEDIATELY: *FEVER GREATER THAN 100.4 F (38 C) OR HIGHER *CHILLS OR SWEATING *NAUSEA AND VOMITING THAT IS NOT CONTROLLED WITH YOUR NAUSEA MEDICATION *UNUSUAL SHORTNESS OF BREATH *UNUSUAL BRUISING OR BLEEDING *URINARY PROBLEMS (pain or burning when urinating, or frequent urination) *BOWEL PROBLEMS (unusual diarrhea, constipation, pain near the anus) TENDERNESS IN MOUTH AND THROAT WITH OR WITHOUT PRESENCE OF ULCERS (sore throat, sores in mouth, or a toothache) UNUSUAL RASH, SWELLING OR PAIN  UNUSUAL VAGINAL DISCHARGE OR ITCHING   Items with * indicate a potential emergency and should be followed up as soon as possible or go to the Emergency Department if any problems should occur.  Please show the CHEMOTHERAPY ALERT CARD or  IMMUNOTHERAPY ALERT CARD at check-in to the Emergency Department and triage nurse.  Should you have questions after your visit or need to cancel or reschedule your appointment, please contact [facility]  Dept: [phone]  and follow the prompts.  Office hours are 8:00 a.m. to 4:30 p.m. Monday - Friday. Please note that voicemails left after 4:00 p.m. may not be returned until the following business day.  We are closed weekends and major holidays. You have access to a nurse at all times for urgent questions. Please call the main number to the clinic Dept: [phone] and follow the prompts.   For any non-urgent questions, you may also contact your provider using MyChart. We now offer e-Visits for anyone 73 and older to request care online for non-urgent symptoms. For details visit [email or web address].   Also download the MyChart app! Go to the app store, search "MyChart", open the app, select [facility], and log in with your MyChart username and password.  Due to Covid, a mask is required upon entering the hospital/clinic. If you do not have a mask, one will be given to you upon arrival. For doctor visits, patients may have 1 support person aged 73 or older with them. For treatment visits, patients cannot have anyone with them due to current Covid guidelines and our immunocompromised population.

## 2021-02-12 ENCOUNTER — Ambulatory Visit (HOSPITAL_COMMUNITY)
Admission: RE | Admit: 2021-02-12 | Discharge: 2021-02-12 | Disposition: A | Discharge status: Home / Self Care | Payer: Medicare HMO | Source: Ambulatory Visit | Attending: Adult Health | Admitting: Adult Health

## 2021-02-12 DIAGNOSIS — Z170 Estrogen receptor positive status [ER+]: Secondary | ICD-10-CM | POA: Diagnosis not present

## 2021-02-12 DIAGNOSIS — Z0189 Encounter for other specified special examinations: Secondary | ICD-10-CM | POA: Diagnosis not present

## 2021-02-12 DIAGNOSIS — Z01818 Encounter for other preprocedural examination: Secondary | ICD-10-CM | POA: Diagnosis present

## 2021-02-12 DIAGNOSIS — C50411 Malignant neoplasm of upper-outer quadrant of right female breast: Secondary | ICD-10-CM | POA: Diagnosis not present

## 2021-02-13 ENCOUNTER — Inpatient Hospital Stay: Payer: Medicare HMO | Attending: Hematology and Oncology

## 2021-02-13 ENCOUNTER — Other Ambulatory Visit: Payer: Self-pay

## 2021-02-13 ENCOUNTER — Encounter: Payer: Self-pay | Admitting: Hematology and Oncology

## 2021-02-13 VITALS — BP 134/77 | HR 87 | Temp 98.6°F | Resp 18

## 2021-02-13 DIAGNOSIS — Z5189 Encounter for other specified aftercare: Secondary | ICD-10-CM | POA: Insufficient documentation

## 2021-02-13 DIAGNOSIS — Z170 Estrogen receptor positive status [ER+]: Secondary | ICD-10-CM | POA: Diagnosis not present

## 2021-02-13 DIAGNOSIS — Z5111 Encounter for antineoplastic chemotherapy: Secondary | ICD-10-CM | POA: Insufficient documentation

## 2021-02-13 DIAGNOSIS — C50411 Malignant neoplasm of upper-outer quadrant of right female breast: Secondary | ICD-10-CM | POA: Insufficient documentation

## 2021-02-13 DIAGNOSIS — Z5112 Encounter for antineoplastic immunotherapy: Secondary | ICD-10-CM | POA: Insufficient documentation

## 2021-02-13 DIAGNOSIS — R197 Diarrhea, unspecified: Secondary | ICD-10-CM | POA: Diagnosis not present

## 2021-02-13 DIAGNOSIS — G620 Drug-induced polyneuropathy: Secondary | ICD-10-CM | POA: Diagnosis not present

## 2021-02-13 LAB — ECHOCARDIOGRAM COMPLETE
AR max vel: 2.89 cm2
AV Peak grad: 8.8 mmHg
Ao pk vel: 1.48 m/s
Area-P 1/2: 2.8 cm2
Calc EF: 63.6 %
S' Lateral: 2.4 cm
Single Plane A2C EF: 65 %
Single Plane A4C EF: 62.5 %

## 2021-02-13 MED ORDER — PEGFILGRASTIM-CBQV 6 MG/0.6ML ~~LOC~~ SOSY
6.0000 mg | PREFILLED_SYRINGE | Freq: Once | SUBCUTANEOUS | Status: AC
  Administered 2021-02-13: 6 mg via SUBCUTANEOUS
  Filled 2021-02-13: qty 0.6

## 2021-02-28 MED FILL — Dexamethasone Sodium Phosphate Inj 100 MG/10ML: INTRAMUSCULAR | Qty: 1 | Status: AC

## 2021-02-28 MED FILL — Fosaprepitant Dimeglumine For IV Infusion 150 MG (Base Eq): INTRAVENOUS | Qty: 5 | Status: AC

## 2021-02-28 NOTE — Progress Notes (Signed)
[facility] Follow up:    Lisa Chroman, MD [address] [place] [address]   DIAGNOSIS:  Cancer Staging  Malignant neoplasm of upper-outer quadrant of right breast in female, estrogen receptor positive ([place]) Staging form: Breast, AJCC 8th Edition - Clinical stage from 10/31/2020: Stage IIA (cT2, cN1, cM0, G2, ER+, PR-, HER2+) - Signed by Nicholas Lose, MD on 10/31/2020 Stage prefix: Initial diagnosis Histologic grading system: 3 grade system   SUMMARY OF ONCOLOGIC HISTORY: Oncology History  Malignant neoplasm of upper-outer quadrant of right breast in female, estrogen receptor positive ([facility])  10/04/2020 Initial Diagnosis   Palpable lump in the right breast, mammogram detected 3.6 cm mass with several axillary lymph nodes, biopsy revealed grade 1-2 IDC, lymph nodes positive for cancer, ER 40%, PR 0%, Ki-67 15%, HER2 positive 3+ by [facility]   10/31/2020 Cancer Staging   Staging form: Breast, AJCC 8th Edition - Clinical stage from 10/31/2020: Stage IIA (cT2, cN1, cM0, G2, ER+, PR-, HER2+) - Signed by Nicholas Lose, MD on 10/31/2020 Stage prefix: Initial diagnosis Histologic grading system: 3 grade system    11/17/2020 -  Chemotherapy   Patient is on Treatment Plan : BREAST  Docetaxel + Carboplatin + Trastuzumab + Pertuzumab  (TCHP) q21d        CURRENT THERAPY: TCHP cycle 6  INTERVAL HISTORY: Lisa Chapman 76 y.o. female returns for evalaution prior to her final treatment of neoadjuvant TCHP.  She will undergo breast mri on Monday, 03/05/2021 and will see Dr. Ninfa Linden on Friday, 03/09/2021.  She underwent echocardiogram on 02/12/2021 that showed EF of 60-65% and normal GLS.   She experienced a slight increase in diarrhea about 1-2 weeks after receiving cycle 5 of therapy.  She managed this with imodium and it has since resolved.  She has very mild constant peripheral neuropathy in the tips of her fingers.  No neuropathy in her toes.     Patient Active Problem List    Diagnosis Date Noted   Port-A-Cath in place 11/17/2020   Malignant neoplasm of upper-outer quadrant of right breast in female, estrogen receptor positive ([facility]) 10/31/2020   Recurrent umbilical hernia 60/45/4098   DVT (deep venous thrombosis) ([facility]) 03/22/2019   Paroxysmal supraventricular tachycardia ([facility]) 03/08/2014   Cardiac murmur 03/08/2014   Mixed hyperlipidemia 03/08/2014    is allergic to ibuprofen, other, boniva [ibandronic acid], and cortisone.  MEDICAL HISTORY: Past Medical History:  Diagnosis Date   Cancer ([facility]) 2022   Breast Cancer   Diverticulosis    Dysrhythmia    Heart murmur    History of colonic polyps    History of DVT (deep vein thrombosis)    Right leg, April 2015   History of shingles    Mixed hyperlipidemia     SURGICAL HISTORY: Past Surgical History:  Procedure Laterality Date   HERNIA REPAIR     MENISCUS REPAIR Left 03/2019   PORTACATH PLACEMENT Left 11/03/2020   Procedure: INSERTION PORT-A-CATH;  Surgeon: Coralie Keens, MD;  Location: [facility] OR;  Service: General;  Laterality: Left;   TONSILLECTOMY      SOCIAL HISTORY: Social History   Socioeconomic History   Marital status: Married    Spouse name: Not on file   Number of children: Not on file   Years of education: Not on file   Highest education level: Not on file  Occupational History   Not on file  Tobacco Use   Smoking status: Never   Smokeless tobacco: Never  Vaping  Use   Vaping Use: Never used  Substance and Sexual Activity   Alcohol use: No    Alcohol/week: 0.0 standard drinks   Drug use: No   Sexual activity: Not on file  Other Topics Concern   Not on file  Social History Narrative   Married. No children   Social Determinants of Radio broadcast assistant Strain: Not on file  Food Insecurity: Not on file  Transportation Needs: Not on file  Physical Activity: Not on file  Stress: Not on file  Social Connections: Not on file  Intimate Partner Violence: Not on file     FAMILY HISTORY: Family History  Problem Relation Age of Onset   Heart attack Mother    COPD Father    Heart attack Father    Emphysema Father     Review of Systems  Constitutional:  Positive for fatigue. Negative for appetite change, chills, fever and unexpected weight change.  HENT:   Negative for hearing loss, lump/mass and trouble swallowing.   Eyes:  Negative for eye problems and icterus.  Respiratory:  Negative for chest tightness, cough and shortness of breath.   Cardiovascular:  Negative for chest pain, leg swelling and palpitations.  Gastrointestinal:  Positive for diarrhea. Negative for abdominal distention, abdominal pain, constipation, nausea and vomiting.  Endocrine: Negative for hot flashes.  Genitourinary:  Negative for difficulty urinating.   Musculoskeletal:  Negative for arthralgias.  Skin:  Negative for itching and rash.  Neurological:  Positive for numbness. Negative for dizziness, extremity weakness and headaches.  Hematological:  Negative for adenopathy. Does not bruise/bleed easily.  Psychiatric/Behavioral:  Negative for depression. The patient is not nervous/anxious.      PHYSICAL EXAMINATION  ECOG PERFORMANCE STATUS: 1 - Symptomatic but completely ambulatory  Vitals:   03/01/21 0903  BP: 132/66  Pulse: 79  Resp: 16  Temp: 97.7 F (36.5 C)  SpO2: 100%    Physical Exam Constitutional:      General: She is not in acute distress.    Appearance: Normal appearance. She is not toxic-appearing.  HENT:     Head: Normocephalic and atraumatic.  Eyes:     General: No scleral icterus. Cardiovascular:     Rate and Rhythm: Normal rate and regular rhythm.     Pulses: Normal pulses.     Heart sounds: Normal heart sounds.  Pulmonary:     Effort: Pulmonary effort is normal.     Breath sounds: Normal breath sounds.  Abdominal:     General: Abdomen is flat. Bowel sounds are normal. There is no distension.     Palpations: Abdomen is soft.      Tenderness: There is no abdominal tenderness.  Musculoskeletal:        General: No swelling.     Cervical back: Neck supple.  Lymphadenopathy:     Cervical: No cervical adenopathy.  Skin:    General: Skin is warm and dry.     Findings: No rash.  Neurological:     General: No focal deficit present.     Mental Status: She is alert.  Psychiatric:        Mood and Affect: Mood normal.        Behavior: Behavior normal.    LABORATORY DATA:  CBC    Component Value Date/Time   WBC 6.8 03/01/2021 0851   WBC 6.6 10/27/2020 1023   RBC 3.32 (L) 03/01/2021 0851   HGB 9.3 (L) 03/01/2021 0851   HCT 29.8 (L) 03/01/2021 3557  PLT 266 03/01/2021 0851   MCV 89.8 03/01/2021 0851   MCH 28.0 03/01/2021 0851   MCHC 31.2 03/01/2021 0851   RDW 17.3 (H) 03/01/2021 0851   LYMPHSABS 2.0 03/01/2021 0851   MONOABS 0.8 03/01/2021 0851   EOSABS 0.0 03/01/2021 0851   BASOSABS 0.0 03/01/2021 0851    CMP     Component Value Date/Time   NA 141 03/01/2021 0851   K 3.4 (L) 03/01/2021 0851   CL 109 03/01/2021 0851   CO2 27 03/01/2021 0851   GLUCOSE 83 03/01/2021 0851   BUN 9 03/01/2021 0851   CREATININE 0.56 03/01/2021 0851   CALCIUM 9.3 03/01/2021 0851   PROT 6.8 03/01/2021 0851   ALBUMIN 3.9 03/01/2021 0851   AST 14 (L) 03/01/2021 0851   ALT 10 03/01/2021 0851   ALKPHOS 68 03/01/2021 0851   BILITOT 0.2 (L) 03/01/2021 0851   GFRNONAA >60 03/01/2021 0851       ASSESSMENT and THERAPY PLAN:   Malignant neoplasm of upper-outer quadrant of right breast in female, estrogen receptor positive (HCC) Palpable lump in the right breast, mammogram detected 3.6 cm mass with several axillary lymph nodes, biopsy revealed grade 1-2 IDC, lymph nodes positive for cancer, ER 40%, PR 0%, Ki-67 15%, HER2 positive 3+ by IHC   Treatment plan: 1. Neoadjuvant chemotherapy with TCH Perjeta 6 cycles followed by Herceptin Perjeta maintenance versus Kadcyla maintenance (based on response to neoadjuvant chemo) for 1  year 2. Followed by breast conserving surgery if possible with sentinel lymph node study 3. Followed by adjuvant radiation therapy if patient had lumpectomy  __________________________________________________________________________________ Current treatment: Cycle 6 [place] site infection: Resolved  Chemo toxicities: 1.  Diarrhea: This is managed with Imodium as needed.  This is resolved today. 2.  Grade 1 peripheral neuropathy: I reviewed risk of this perhaps worsening after the completion of treatment.  I reviewed this with Dr. Lindi Adie and she will proceed with full dose treatment today since it is so early.  She has MRI scheduled on January 23.  She will see Dr. Ninfa Linden on January 27.  I reviewed with her that we will see her in 3 weeks from now for labs, follow-up, Herceptin Perjeta.  She verbalized understanding of this.  We reviewed that based on her pathology results we will then decide whether she will continue with Herceptin and Perjeta or if we will change to Kadcyla.    All questions were answered. The patient knows to call the clinic with any problems, questions or concerns. We can certainly see the patient much sooner if necessary.  Total encounter time: 30 minutes in face-to-face visit time, chart review, lab review, care coordination, order entry, discussion with other provider, and documentation of the encounter.  Wilber Bihari, NP 03/01/21 1:08 PM Medical Oncology and Hematology [facility] [place], [place] [address] Tel. [phone]    Fax. [phone]  *Total Encounter Time as defined by the Centers for Medicare and Medicaid Services includes, in addition to the face-to-face time of a patient visit (documented in the note above) non-face-to-face time: obtaining and reviewing outside history, ordering and reviewing medications, tests or procedures, care coordination (communications with other health care professionals or caregivers)  and documentation in the medical record.

## 2021-03-01 ENCOUNTER — Inpatient Hospital Stay (HOSPITAL_BASED_OUTPATIENT_CLINIC_OR_DEPARTMENT_OTHER): Payer: Medicare HMO | Admitting: Adult Health

## 2021-03-01 ENCOUNTER — Inpatient Hospital Stay: Payer: Medicare HMO

## 2021-03-01 ENCOUNTER — Other Ambulatory Visit: Payer: Self-pay

## 2021-03-01 ENCOUNTER — Encounter: Payer: Self-pay | Admitting: Adult Health

## 2021-03-01 ENCOUNTER — Encounter: Payer: Self-pay | Admitting: *Deleted

## 2021-03-01 VITALS — BP 132/66 | HR 79 | Temp 97.7°F | Resp 16 | Ht 62.0 in | Wt 141.0 lb

## 2021-03-01 DIAGNOSIS — Z170 Estrogen receptor positive status [ER+]: Secondary | ICD-10-CM

## 2021-03-01 DIAGNOSIS — G620 Drug-induced polyneuropathy: Secondary | ICD-10-CM | POA: Diagnosis not present

## 2021-03-01 DIAGNOSIS — C50411 Malignant neoplasm of upper-outer quadrant of right female breast: Secondary | ICD-10-CM

## 2021-03-01 DIAGNOSIS — R197 Diarrhea, unspecified: Secondary | ICD-10-CM | POA: Diagnosis not present

## 2021-03-01 DIAGNOSIS — Z5112 Encounter for antineoplastic immunotherapy: Secondary | ICD-10-CM | POA: Diagnosis not present

## 2021-03-01 DIAGNOSIS — Z95828 Presence of other vascular implants and grafts: Secondary | ICD-10-CM

## 2021-03-01 DIAGNOSIS — Z5111 Encounter for antineoplastic chemotherapy: Secondary | ICD-10-CM | POA: Diagnosis not present

## 2021-03-01 DIAGNOSIS — Z5189 Encounter for other specified aftercare: Secondary | ICD-10-CM | POA: Diagnosis not present

## 2021-03-01 LAB — CBC WITH DIFFERENTIAL (CANCER CENTER ONLY)
Abs Immature Granulocytes: 0.02 10*3/uL (ref 0.00–0.07)
Basophils Absolute: 0 10*3/uL (ref 0.0–0.1)
Basophils Relative: 0 %
Eosinophils Absolute: 0 10*3/uL (ref 0.0–0.5)
Eosinophils Relative: 0 %
HCT: 29.8 % — ABNORMAL LOW (ref 36.0–46.0)
Hemoglobin: 9.3 g/dL — ABNORMAL LOW (ref 12.0–15.0)
Immature Granulocytes: 0 %
Lymphocytes Relative: 29 %
Lymphs Abs: 2 10*3/uL (ref 0.7–4.0)
MCH: 28 pg (ref 26.0–34.0)
MCHC: 31.2 g/dL (ref 30.0–36.0)
MCV: 89.8 fL (ref 80.0–100.0)
Monocytes Absolute: 0.8 10*3/uL (ref 0.1–1.0)
Monocytes Relative: 12 %
Neutro Abs: 4 10*3/uL (ref 1.7–7.7)
Neutrophils Relative %: 59 %
Platelet Count: 266 10*3/uL (ref 150–400)
RBC: 3.32 MIL/uL — ABNORMAL LOW (ref 3.87–5.11)
RDW: 17.3 % — ABNORMAL HIGH (ref 11.5–15.5)
WBC Count: 6.8 10*3/uL (ref 4.0–10.5)
nRBC: 0 % (ref 0.0–0.2)

## 2021-03-01 LAB — CMP (CANCER CENTER ONLY)
ALT: 10 U/L (ref 0–44)
AST: 14 U/L — ABNORMAL LOW (ref 15–41)
Albumin: 3.9 g/dL (ref 3.5–5.0)
Alkaline Phosphatase: 68 U/L (ref 38–126)
Anion gap: 5 (ref 5–15)
BUN: 9 mg/dL (ref 8–23)
CO2: 27 mmol/L (ref 22–32)
Calcium: 9.3 mg/dL (ref 8.9–10.3)
Chloride: 109 mmol/L (ref 98–111)
Creatinine: 0.56 mg/dL (ref 0.44–1.00)
GFR, Estimated: >60 mL/min (ref >60)
Glucose, Bld: 83 mg/dL (ref 70–99)
Potassium: 3.4 mmol/L — ABNORMAL LOW (ref 3.5–5.1)
Sodium: 141 mmol/L (ref 135–145)
Total Bilirubin: 0.2 mg/dL — ABNORMAL LOW (ref 0.3–1.2)
Total Protein: 6.8 g/dL (ref 6.5–8.1)

## 2021-03-01 MED ORDER — ACETAMINOPHEN 325 MG PO TABS
650.0000 mg | ORAL_TABLET | Freq: Once | ORAL | Status: AC
  Administered 2021-03-01: 650 mg via ORAL
  Filled 2021-03-01: qty 2

## 2021-03-01 MED ORDER — SODIUM CHLORIDE 0.9 % IV SOLN
65.0000 mg/m2 | Freq: Once | INTRAVENOUS | Status: AC
  Administered 2021-03-01: 110 mg via INTRAVENOUS
  Filled 2021-03-01: qty 11

## 2021-03-01 MED ORDER — DIPHENHYDRAMINE HCL 25 MG PO CAPS
25.0000 mg | ORAL_CAPSULE | Freq: Once | ORAL | Status: AC
  Administered 2021-03-01: 25 mg via ORAL
  Filled 2021-03-01: qty 1

## 2021-03-01 MED ORDER — SODIUM CHLORIDE 0.9 % IV SOLN
150.0000 mg | Freq: Once | INTRAVENOUS | Status: AC
  Administered 2021-03-01: 150 mg via INTRAVENOUS
  Filled 2021-03-01: qty 150

## 2021-03-01 MED ORDER — HEPARIN SOD (PORK) LOCK FLUSH 100 UNIT/ML IV SOLN
500.0000 [IU] | Freq: Once | INTRAVENOUS | Status: AC | PRN
  Administered 2021-03-01: 500 [IU]

## 2021-03-01 MED ORDER — SODIUM CHLORIDE 0.9 % IV SOLN
317.6000 mg | Freq: Once | INTRAVENOUS | Status: AC
  Administered 2021-03-01: 320 mg via INTRAVENOUS
  Filled 2021-03-01: qty 32

## 2021-03-01 MED ORDER — SODIUM CHLORIDE 0.9% FLUSH
10.0000 mL | INTRAVENOUS | Status: DC | PRN
  Administered 2021-03-01: 10 mL

## 2021-03-01 MED ORDER — PALONOSETRON HCL INJECTION 0.25 MG/5ML
0.2500 mg | Freq: Once | INTRAVENOUS | Status: AC
  Administered 2021-03-01: 0.25 mg via INTRAVENOUS
  Filled 2021-03-01: qty 5

## 2021-03-01 MED ORDER — SODIUM CHLORIDE 0.9 % IV SOLN: Freq: Once | INTRAVENOUS | Status: AC

## 2021-03-01 MED ORDER — SODIUM CHLORIDE 0.9 % IV SOLN
420.0000 mg | Freq: Once | INTRAVENOUS | Status: AC
  Administered 2021-03-01: 420 mg via INTRAVENOUS
  Filled 2021-03-01: qty 14

## 2021-03-01 MED ORDER — SODIUM CHLORIDE 0.9 % IV SOLN
10.0000 mg | Freq: Once | INTRAVENOUS | Status: AC
  Administered 2021-03-01: 10 mg via INTRAVENOUS
  Filled 2021-03-01: qty 10

## 2021-03-01 MED ORDER — TRASTUZUMAB-ANNS CHEMO 150 MG IV SOLR
6.0000 mg/kg | Freq: Once | INTRAVENOUS | Status: AC
  Administered 2021-03-01: 420 mg via INTRAVENOUS
  Filled 2021-03-01: qty 20

## 2021-03-01 MED ORDER — SODIUM CHLORIDE 0.9% FLUSH
10.0000 mL | Freq: Once | INTRAVENOUS | Status: AC
  Administered 2021-03-01: 10 mL

## 2021-03-01 NOTE — Patient Instructions (Signed)
[facility]  Discharge Instructions: Thank you for choosing [facility] to provide your oncology and hematology care.   If you have a lab appointment with the [facility], please go directly to the [facility] and check in at the registration area.   Wear comfortable clothing and clothing appropriate for easy access to any Portacath or PICC line.   We strive to give you quality time with your provider. You may need to reschedule your appointment if you arrive late (15 or more minutes).  Arriving late affects you and other patients whose appointments are after yours.  Also, if you miss three or more appointments without notifying the office, you may be dismissed from the clinic at the providers discretion.      For prescription refill requests, have your pharmacy contact our office and allow 72 hours for refills to be completed.    Today you received the following chemotherapy and/or immunotherapy agents : Trastuzumab, Pertuzumab, Taxotere, Carboplatin      To help prevent nausea and vomiting after your treatment, we encourage you to take your nausea medication as directed.  BELOW ARE SYMPTOMS THAT SHOULD BE REPORTED IMMEDIATELY: *FEVER GREATER THAN 100.4 F (38 C) OR HIGHER *CHILLS OR SWEATING *NAUSEA AND VOMITING THAT IS NOT CONTROLLED WITH YOUR NAUSEA MEDICATION *UNUSUAL SHORTNESS OF BREATH *UNUSUAL BRUISING OR BLEEDING *URINARY PROBLEMS (pain or burning when urinating, or frequent urination) *BOWEL PROBLEMS (unusual diarrhea, constipation, pain near the anus) TENDERNESS IN MOUTH AND THROAT WITH OR WITHOUT PRESENCE OF ULCERS (sore throat, sores in mouth, or a toothache) UNUSUAL RASH, SWELLING OR PAIN  UNUSUAL VAGINAL DISCHARGE OR ITCHING   Items with * indicate a potential emergency and should be followed up as soon as possible or go to the Emergency Department if any problems should occur.  Please show the CHEMOTHERAPY ALERT CARD or  IMMUNOTHERAPY ALERT CARD at check-in to the Emergency Department and triage nurse.  Should you have questions after your visit or need to cancel or reschedule your appointment, please contact [facility]  Dept: [phone]  and follow the prompts.  Office hours are 8:00 a.m. to 4:30 p.m. Monday - Friday. Please note that voicemails left after 4:00 p.m. may not be returned until the following business day.  We are closed weekends and major holidays. You have access to a nurse at all times for urgent questions. Please call the main number to the clinic Dept: [phone] and follow the prompts.   For any non-urgent questions, you may also contact your provider using MyChart. We now offer e-Visits for anyone 48 and older to request care online for non-urgent symptoms. For details visit [email or web address].   Also download the MyChart app! Go to the app store, search "MyChart", open the app, select [facility], and log in with your MyChart username and password.  Due to Covid, a mask is required upon entering the hospital/clinic. If you do not have a mask, one will be given to you upon arrival. For doctor visits, patients may have 1 support person aged 52 or older with them. For treatment visits, patients cannot have anyone with them due to current Covid guidelines and our immunocompromised population.

## 2021-03-01 NOTE — Assessment & Plan Note (Signed)
Palpable lump in the right breast, mammogram detected 3.6 cm mass with several axillary lymph nodes, biopsy revealed grade 1-2 IDC, lymph nodes positive for cancer, ER 40%, PR 0%, Ki-67 15%, HER2 positive 3+ by IHC  Treatment plan: 1. Neoadjuvant chemotherapy with TCH Perjeta 6 cycles followed by Herceptin Perjeta maintenance versus Kadcyla maintenance (based on response to neoadjuvant chemo) for 1 year 2. Followed by breast conserving surgery if possible with sentinel lymph node study 3. Followed by adjuvant radiation therapy if patient had lumpectomy __________________________________________________________________________________ Current treatment:Cycle6TCH Perjeta Port site infection:Resolved  Chemo toxicities: 1.  Diarrhea: This is managed with Imodium as needed.  This is resolved today. 2.  Grade 1 peripheral neuropathy: I reviewed risk of this perhaps worsening after the completion of treatment.  I reviewed this with Dr. Lindi Adie and she will proceed with full dose treatment today since it is so early.  She has MRI scheduled on January 23.  She will see Dr. Ninfa Linden on January 27.  I reviewed with her that we will see her in 3 weeks from now for labs, follow-up, Herceptin Perjeta.  She verbalized understanding of this.  We reviewed that based on her pathology results we will then decide whether she will continue with Herceptin and Perjeta or if we will change to Kadcyla.

## 2021-03-05 ENCOUNTER — Other Ambulatory Visit: Payer: Self-pay

## 2021-03-05 ENCOUNTER — Ambulatory Visit
Admission: RE | Admit: 2021-03-05 | Discharge: 2021-03-05 | Disposition: A | Discharge status: Home / Self Care | Payer: Medicare HMO | Source: Ambulatory Visit | Attending: Adult Health | Admitting: Adult Health

## 2021-03-05 ENCOUNTER — Inpatient Hospital Stay: Payer: Medicare HMO

## 2021-03-05 VITALS — BP 133/80 | HR 94 | Temp 98.2°F | Resp 18

## 2021-03-05 DIAGNOSIS — R197 Diarrhea, unspecified: Secondary | ICD-10-CM | POA: Diagnosis not present

## 2021-03-05 DIAGNOSIS — Z5189 Encounter for other specified aftercare: Secondary | ICD-10-CM | POA: Diagnosis not present

## 2021-03-05 DIAGNOSIS — C50911 Malignant neoplasm of unspecified site of right female breast: Secondary | ICD-10-CM | POA: Diagnosis not present

## 2021-03-05 DIAGNOSIS — Z5112 Encounter for antineoplastic immunotherapy: Secondary | ICD-10-CM | POA: Diagnosis not present

## 2021-03-05 DIAGNOSIS — C50912 Malignant neoplasm of unspecified site of left female breast: Secondary | ICD-10-CM | POA: Diagnosis not present

## 2021-03-05 DIAGNOSIS — C50411 Malignant neoplasm of upper-outer quadrant of right female breast: Secondary | ICD-10-CM | POA: Diagnosis not present

## 2021-03-05 DIAGNOSIS — Z170 Estrogen receptor positive status [ER+]: Secondary | ICD-10-CM

## 2021-03-05 DIAGNOSIS — G620 Drug-induced polyneuropathy: Secondary | ICD-10-CM | POA: Diagnosis not present

## 2021-03-05 DIAGNOSIS — Z5111 Encounter for antineoplastic chemotherapy: Secondary | ICD-10-CM | POA: Diagnosis not present

## 2021-03-05 IMAGING — MR MR BREAST BILAT WO/W CM
8 of 12 series · 32 of 48 positions shown · IV contrast (7 ml gadavist)
Comparison: Previous exam(s).

CLINICAL DATA: 75-year-old female with known metastatic right
breast cancer presents for re-evaluation after neoadjuvant
chemotherapy.

EXAM:
BILATERAL BREAST MRI WITH AND WITHOUT CONTRAST
TECHNIQUE: Multiplanar, multisequence MR images of both breasts were obtained
prior to and following the intravenous administration of 7 ml of
Gadavist.

[Series 2: t2_tirm_tra ipat (a-p) · axial · 3.0mm · 0.68mm/px · 1 of 55 slices shown]
[im 1/55]
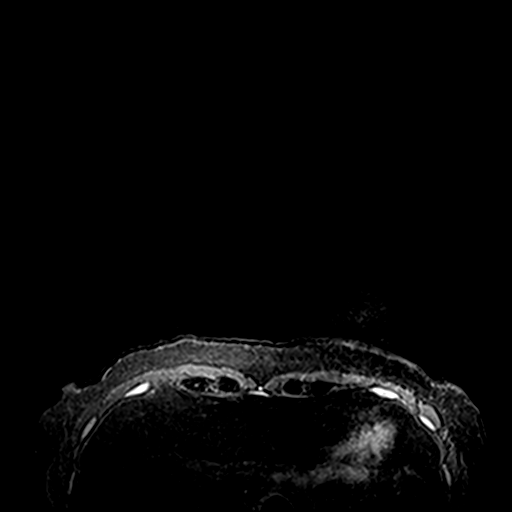

[Series 3: fl3d pre-cm no · axial · non-contrast · 1.2mm · 0.94mm/px · z∈[-104,+67]mm · 5 of 144 slices shown]
[im 1/144]
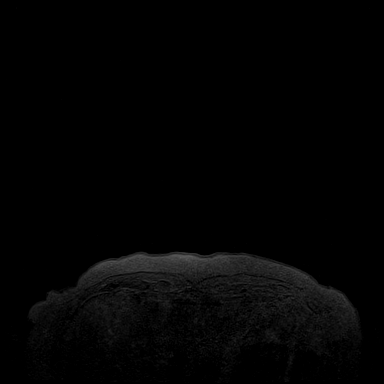
[im 36/144]
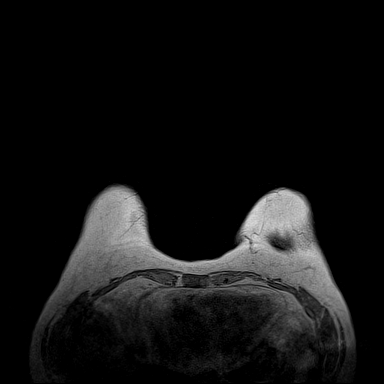
[im 72/144]
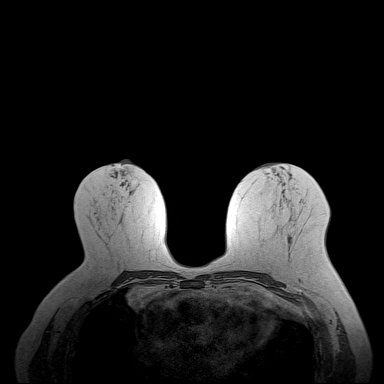
[im 108/144]
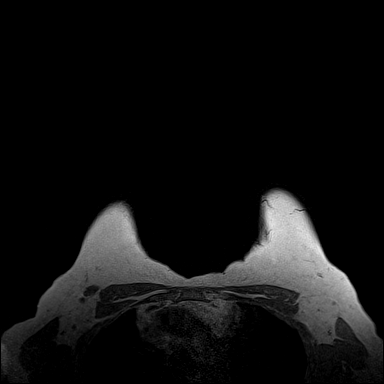
[im 144/144]
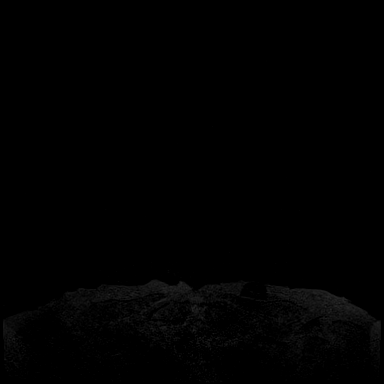

[Series 4: fl3d pre-cm · axial · non-contrast · 1.2mm · 0.94mm/px · z∈[-104,+67]mm · 5 of 144 slices shown]
[im 1/144]
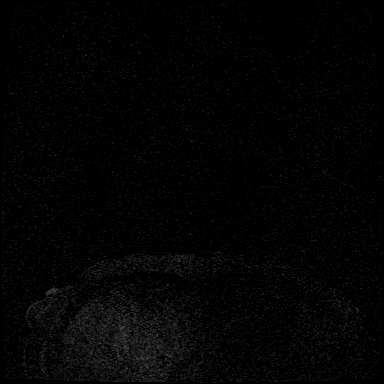
[im 36/144]
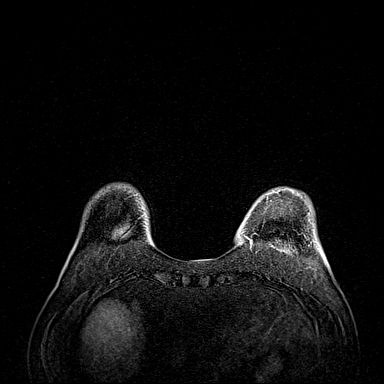
[im 72/144]
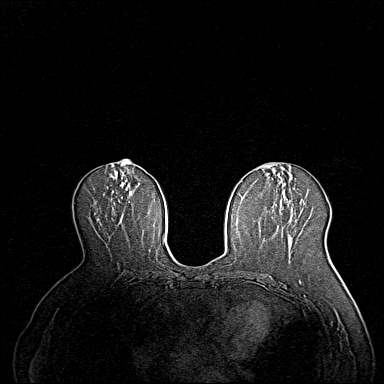
[im 108/144]
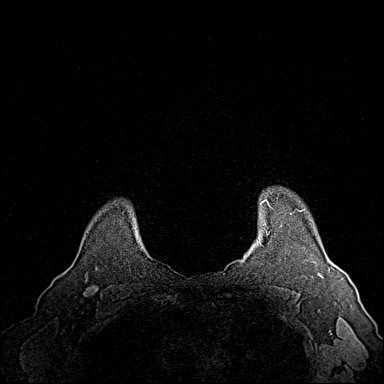
[im 144/144]
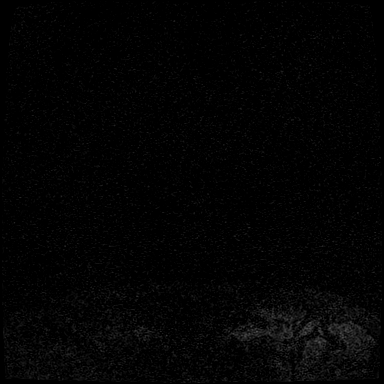

[Series 5: fl3d post immediate · axial · 1.2mm · 0.94mm/px · z∈[-104,+67]mm · 5 of 144 slices shown (1 of 3)]
[im 1/144]
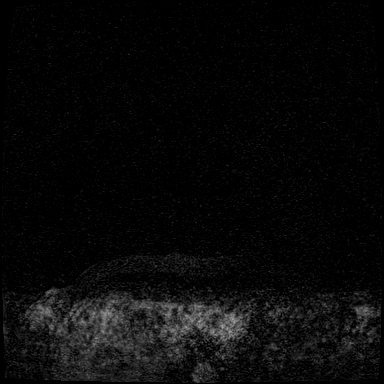
[im 36/144]
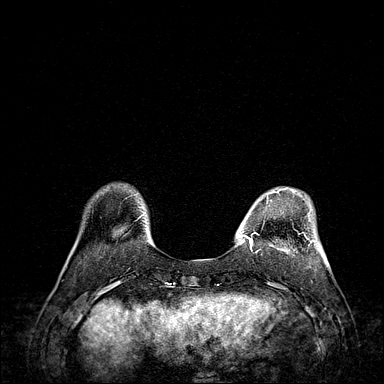
[im 72/144]
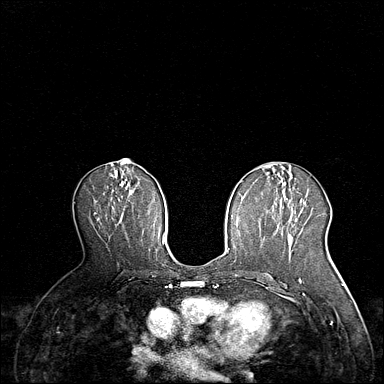
[im 108/144]
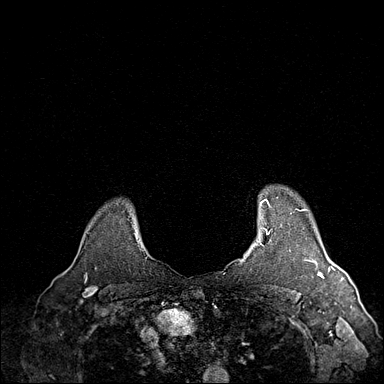
[im 144/144]
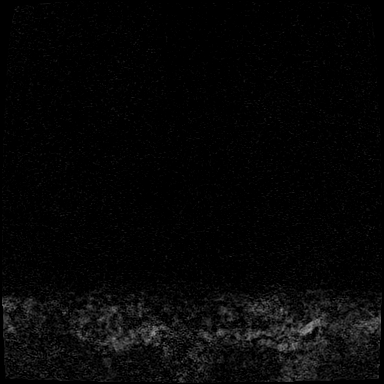

[Series 6: fl3d post immediate · axial · 1.2mm · 0.94mm/px · z∈[-104,+67]mm · 5 of 144 slices shown (2 of 3)]
[im 1/144]
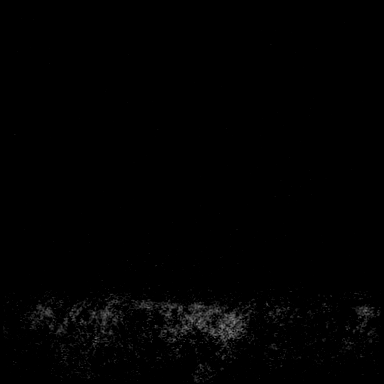
[im 36/144]
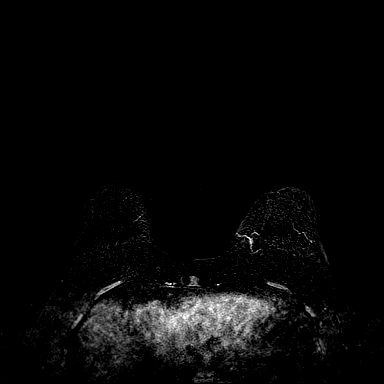
[im 72/144]
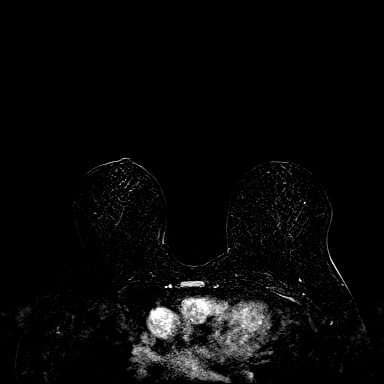
[im 108/144]
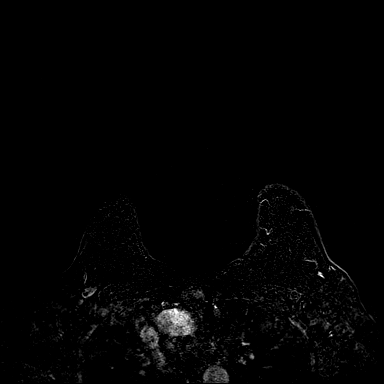
[im 144/144]
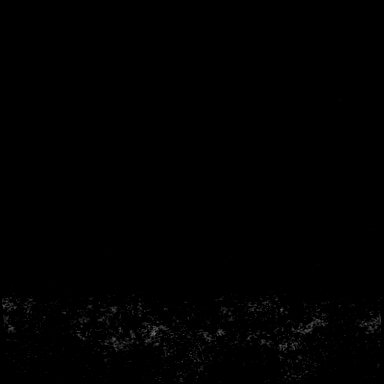

[Series 7: fl3d post immediate · axial · 172.8mm · 0.94mm/px · 1 of 1 slices shown (3 of 3)]
[im 1/1]
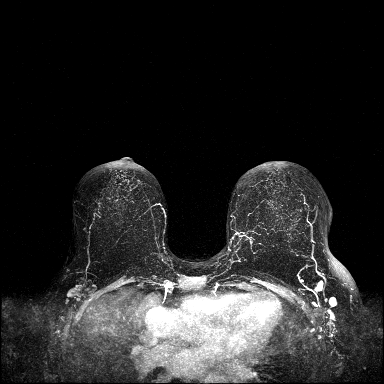

[Series 8: fl3d post 3min · axial · 1.2mm · 0.94mm/px · z∈[-104,+67]mm · 6 of 144 slices shown]
[im 1/144]
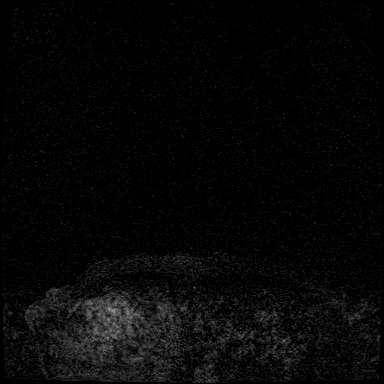
[im 29/144]
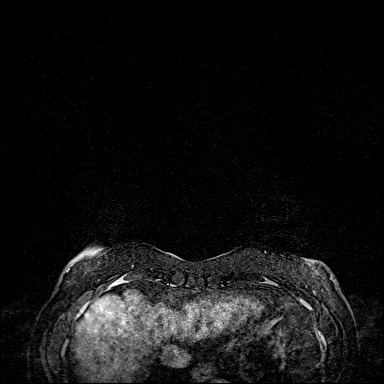
[im 58/144]
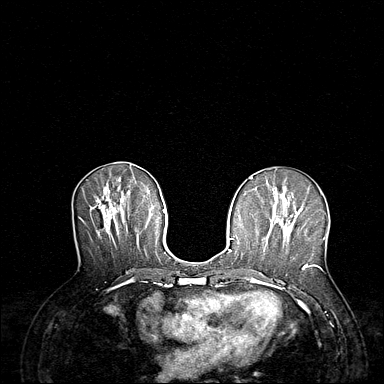
[im 86/144]
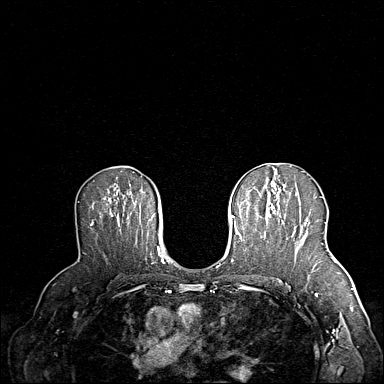
[im 115/144]
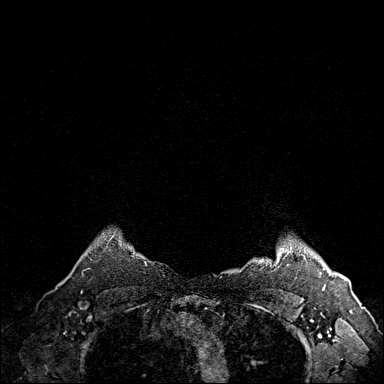
[im 144/144]
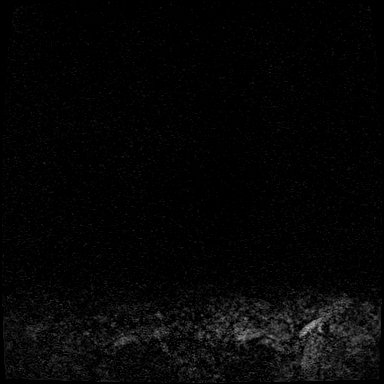

[Series 9: fl3d post 3min_sub · axial · 1.2mm · 0.94mm/px · z∈[-104,-2]mm · 4 of 144 slices shown]
[im 1/144]
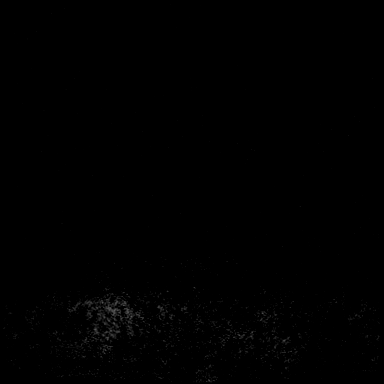
[im 29/144]
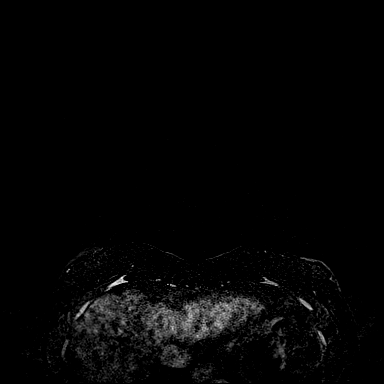
[im 58/144]
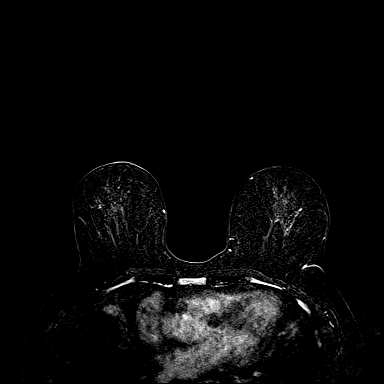
[im 86/144]
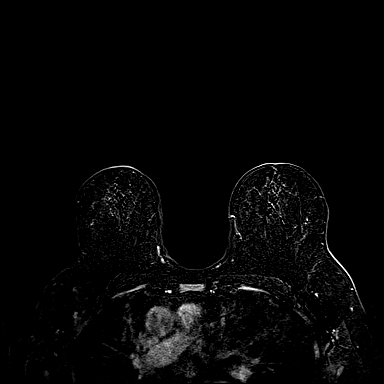

[32 of 48 positions shown; findings below may reference images not displayed]

Three-dimensional MR images were rendered by post-processing of the
original MR data on an independent workstation. The
three-dimensional MR images were interpreted, and findings are
reported in the following complete MRI report for this study. Three
dimensional images were evaluated at the independent interpreting
workstation using the DynaCAD thin client.
FINDINGS: Breast composition: b. Scattered fibroglandular tissue.

Background parenchymal enhancement: Mild.

Right breast: Susceptibility artifact from post biopsy clips are
demonstrated in the lateral right breast and right axilla. There is
been complete interval resolution of the enhancing mass in the
lateral right breast. No residual masslike or non mass enhancement
is identified on today's study.

Left breast: No suspicious mass or abnormal enhancement.

Lymph nodes: There has been involution of multiple right axillary
lymph nodes consistent with good response to chemotherapy. No
additional suspicious lymphadenopathy identified within the
bilateral axilla or internal mammary chain.

Ancillary findings: There is diffuse contrast enhancement of the
sternum, new from prior comparison study.
IMPRESSION: 1. Complete imaging resolution of the patient's known right breast
malignancy and involution of metastatic right axillary lymph nodes
consistent with good response to chemotherapy.
2. Diffuse enhancement of the sternum, new from prior comparison
study. This is an indeterminate and nonspecific finding. Recommend
correlation with cross-sectional imaging such as PET-CT.

RECOMMENDATION:
Per clinical treatment plan.

BI-RADS CATEGORY  6: Known biopsy-proven malignancy.

## 2021-03-05 MED ORDER — GADOBUTROL 1 MMOL/ML IV SOLN: 7.0000 mL | Freq: Once | INTRAVENOUS | Status: DC | PRN

## 2021-03-05 MED ORDER — PEGFILGRASTIM-CBQV 6 MG/0.6ML ~~LOC~~ SOSY
6.0000 mg | PREFILLED_SYRINGE | Freq: Once | SUBCUTANEOUS | Status: AC
  Administered 2021-03-05: 6 mg via SUBCUTANEOUS
  Filled 2021-03-05: qty 0.6

## 2021-03-05 MED ORDER — GADOBUTROL 1 MMOL/ML IV SOLN
7.0000 mL | Freq: Once | INTRAVENOUS | Status: AC | PRN
  Administered 2021-03-05: 7 mL via INTRAVENOUS

## 2021-03-05 NOTE — Patient Instructions (Signed)

## 2021-03-06 ENCOUNTER — Encounter: Payer: Self-pay | Admitting: *Deleted

## 2021-03-06 ENCOUNTER — Other Ambulatory Visit: Payer: Self-pay | Admitting: *Deleted

## 2021-03-06 ENCOUNTER — Telehealth: Payer: Self-pay | Admitting: *Deleted

## 2021-03-06 DIAGNOSIS — R928 Other abnormal and inconclusive findings on diagnostic imaging of breast: Secondary | ICD-10-CM

## 2021-03-06 DIAGNOSIS — C50411 Malignant neoplasm of upper-outer quadrant of right female breast: Secondary | ICD-10-CM

## 2021-03-06 NOTE — Telephone Encounter (Signed)
Called and spoke with patient and gave her results of her recent MRI breast. I let her know that she had great response to chemotherapy. Informed her that there was an indeterminate area of enhancement in the sternum that they are recommending PET CT for and that Dr. Lindi Adie does not feel this is cancer but we need to have it imaged.  Patient verbalized understanding. Orders placed. We will call her with an appointment.

## 2021-03-09 ENCOUNTER — Other Ambulatory Visit: Payer: Self-pay | Admitting: Surgery

## 2021-03-09 DIAGNOSIS — Z853 Personal history of malignant neoplasm of breast: Secondary | ICD-10-CM

## 2021-03-09 DIAGNOSIS — C50911 Malignant neoplasm of unspecified site of right female breast: Secondary | ICD-10-CM | POA: Diagnosis not present

## 2021-03-09 DIAGNOSIS — C773 Secondary and unspecified malignant neoplasm of axilla and upper limb lymph nodes: Secondary | ICD-10-CM | POA: Diagnosis not present

## 2021-03-13 ENCOUNTER — Other Ambulatory Visit: Payer: Self-pay | Admitting: *Deleted

## 2021-03-13 DIAGNOSIS — C50411 Malignant neoplasm of upper-outer quadrant of right female breast: Secondary | ICD-10-CM

## 2021-03-14 ENCOUNTER — Ambulatory Visit (HOSPITAL_COMMUNITY): Payer: Medicare HMO

## 2021-03-14 ENCOUNTER — Other Ambulatory Visit: Payer: Self-pay | Admitting: Surgery

## 2021-03-14 ENCOUNTER — Ambulatory Visit: Payer: Medicare HMO | Attending: Hematology and Oncology | Admitting: Physical Therapy

## 2021-03-14 ENCOUNTER — Other Ambulatory Visit: Payer: Self-pay

## 2021-03-14 ENCOUNTER — Ambulatory Visit (HOSPITAL_COMMUNITY)
Admission: RE | Admit: 2021-03-14 | Discharge: 2021-03-14 | Disposition: A | Discharge status: Home / Self Care | Payer: Medicare HMO | Source: Ambulatory Visit | Attending: Hematology and Oncology | Admitting: Hematology and Oncology

## 2021-03-14 ENCOUNTER — Encounter: Payer: Self-pay | Admitting: Physical Therapy

## 2021-03-14 DIAGNOSIS — R928 Other abnormal and inconclusive findings on diagnostic imaging of breast: Secondary | ICD-10-CM | POA: Diagnosis not present

## 2021-03-14 DIAGNOSIS — Z170 Estrogen receptor positive status [ER+]: Secondary | ICD-10-CM | POA: Insufficient documentation

## 2021-03-14 DIAGNOSIS — C50411 Malignant neoplasm of upper-outer quadrant of right female breast: Secondary | ICD-10-CM | POA: Insufficient documentation

## 2021-03-14 DIAGNOSIS — R293 Abnormal posture: Secondary | ICD-10-CM | POA: Insufficient documentation

## 2021-03-14 DIAGNOSIS — Z853 Personal history of malignant neoplasm of breast: Secondary | ICD-10-CM

## 2021-03-14 DIAGNOSIS — C50919 Malignant neoplasm of unspecified site of unspecified female breast: Secondary | ICD-10-CM | POA: Diagnosis not present

## 2021-03-14 DIAGNOSIS — I2510 Atherosclerotic heart disease of native coronary artery without angina pectoris: Secondary | ICD-10-CM | POA: Diagnosis not present

## 2021-03-14 DIAGNOSIS — K5730 Diverticulosis of large intestine without perforation or abscess without bleeding: Secondary | ICD-10-CM | POA: Diagnosis not present

## 2021-03-14 DIAGNOSIS — J8410 Pulmonary fibrosis, unspecified: Secondary | ICD-10-CM | POA: Diagnosis not present

## 2021-03-14 LAB — GLUCOSE, CAPILLARY: Glucose-Capillary: 104 mg/dL — ABNORMAL HIGH (ref 70–99)

## 2021-03-14 MED ORDER — FLUDEOXYGLUCOSE F - 18 (FDG) INJECTION
6.7700 | Freq: Once | INTRAVENOUS | Status: AC | PRN
  Administered 2021-03-14: 6.77 via INTRAVENOUS

## 2021-03-14 NOTE — Therapy (Signed)
[facility] @ [address] [place], [place], [address] Phone: [phone]   Fax:  [phone]  Physical Therapy Evaluation  Patient Details  Name: Lisa Chapman MRN: [id number] Date of Birth: 05/28/1945 Referring Provider (PT): Lindi Adie   Encounter Date: 03/14/2021   PT End of Session - 03/14/21 0845     Visit Number 1    Number of Visits 2    Date for PT Re-Evaluation 05/09/21    PT Start Time 0806    PT Stop Time 0843    PT Time Calculation (min) 37 min    Activity Tolerance Patient tolerated treatment well    Behavior During Therapy [facility] for tasks assessed/performed             Past Medical History:  Diagnosis Date   Cancer ([place]) 2022   Breast Cancer   Diverticulosis    Dysrhythmia    Heart murmur    History of colonic polyps    History of DVT (deep vein thrombosis)    Right leg, April 2015   History of shingles    Mixed hyperlipidemia     Past Surgical History:  Procedure Laterality Date   HERNIA REPAIR     MENISCUS REPAIR Left 03/2019   PORTACATH PLACEMENT Left 11/03/2020   Procedure: INSERTION PORT-A-CATH;  Surgeon: Coralie Keens, MD;  Location: [address];  Service: General;  Laterality: Left;   TONSILLECTOMY      There were no vitals filed for this visit.    Subjective Assessment - 03/14/21 0808     Subjective I don't have any shoulder issues.    Pertinent History R breast cancer with mets to axillary lymph node, currently undergoing neoadjuvant chemo, plan is to undergo a R breast lumpectomy and ALND 04/09/21    Patient Stated Goals to gain info from providers    Currently in Pain? No/denies    Pain Score 0-No pain                [facility] PT Assessment - 03/14/21 0001       Assessment   Medical Diagnosis R breast cancer with mets to lymph node    Referring Provider (PT) [place]    Onset Date/Surgical Date 04/09/21    Hand Dominance Right    Prior Therapy PT for torn meniscus over a year  ago      Precautions   Precautions Other (comment)    Precaution Comments active cancer      Restrictions   Weight Bearing Restrictions No      Balance Screen   Has the patient fallen in the past 6 months No    Has the patient had a decrease in activity level because of a fear of falling?  No    Is the patient reluctant to leave their home because of a fear of falling?  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 and does band exercises      Cognition   Overall Cognitive Status Within Functional Limits for tasks assessed  Functional Tests   Functional tests Sit to Stand      Sit to Stand   Comments 30 sec sit to stand: 21 reps      Posture/Postural Control   Posture/Postural Control Postural limitations    Postural Limitations Rounded Shoulders;Forward head      ROM / Strength   AROM / PROM / Strength AROM      AROM   AROM Assessment Site Shoulder    Right/Left Shoulder Right;Left    Right Shoulder Extension 89 Degrees    Right Shoulder Flexion 161 Degrees    Right Shoulder ABduction 175 Degrees    Right Shoulder Internal Rotation 43 Degrees    Right Shoulder External Rotation 105 Degrees    Left Shoulder Extension 87 Degrees    Left Shoulder Flexion 172 Degrees    Left Shoulder ABduction 180 Degrees    Left Shoulder Internal Rotation 58 Degrees    Left Shoulder External Rotation 94 Degrees               LYMPHEDEMA/ONCOLOGY QUESTIONNAIRE - 03/14/21 0001       Type   Cancer Type R breast cancer      Treatment   Active Chemotherapy Treatment No    Past Chemotherapy Treatment Yes      Lymphedema  Assessments   Lymphedema Assessments Upper extremities      Right Upper Extremity Lymphedema   15 cm Proximal to Olecranon Process 27.8 cm    Olecranon Process 23 cm    15 cm Proximal to Ulnar Styloid Process 22 cm    Just Proximal to Ulnar Styloid Process 15 cm    Across Hand at [employer] 19 cm    At [place] of 2nd Digit 6 cm      Left Upper Extremity Lymphedema   15 cm Proximal to Olecranon Process 28 cm    Olecranon Process 23.7 cm    15 cm Proximal to Ulnar Styloid Process 21.6 cm    Just Proximal to Ulnar Styloid Process 14.7 cm    Across Hand at [employer] 17.7 cm    At [place] of 2nd Digit 5.8 cm             L-DEX FLOWSHEETS - 03/14/21 0800       L-DEX LYMPHEDEMA SCREENING   Measurement Type Unilateral    L-DEX MEASUREMENT EXTREMITY Upper Extremity    POSITION  Standing    DOMINANT SIDE Right    At Risk Side Right    BASELINE SCORE (UNILATERAL) -0.5              The patient was assessed using the L-Dex machine today to produce a lymphedema index baseline score. The patient will be reassessed on a regular basis (typically every 3 months) to obtain new L-Dex scores. If the score is > 6.5 points away from his/her baseline score indicating onset of subclinical lymphedema, it will be recommended to wear a compression garment for 4 weeks, 12 hours per day and then be reassessed. If the score continues to be > 6.5 points from baseline at reassessment, we will initiate lymphedema treatment. Assessing in this manner has a 95% rate of preventing clinically significant lymphedema.       Objective measurements completed on examination: See above findings.                PT Education - 03/14/21 0850     Education Details ABC class, post op shoulder ROM exercises, anatomy  and physiology of lymphatic system, signs and symptoms of lymphedema, SOZO, lymphedema risk reduction practices    Person(s) Educated Patient    Methods Explanation;Handout     Comprehension Verbalized understanding                 PT Long Term Goals - 03/14/21 0850       PT LONG TERM GOAL #1   Title Pt will demonstrate return to baseline shoulder ROM and not demonstrate any signs of lymphedema.    Time 8    Period Weeks    Status New    Target Date 05/09/21             Breast Clinic Goals - 03/14/21 0849       Patient will be able to verbalize understanding of pertinent lymphedema risk reduction practices relevant to her diagnosis specifically related to skin care.   Time 1    Period Days    Status Achieved      Patient will be able to return demonstrate and/or verbalize understanding of the post-op home exercise program related to regaining shoulder range of motion.   Time 1    Period Days    Status Achieved      Patient will be able to verbalize understanding of the importance of attending the postoperative After Breast Cancer Class for further lymphedema risk reduction education and therapeutic exercise.   Time 1    Period Days    Status Achieved                   Plan - 03/14/21 0846     Clinical Impression Statement Pt was diagnosed with R breast cancer, grade 1-2 invasive ductal carcinoma. It is HER2+, ER+, PR-. She has completed neoadjuvant chemotherapy and should be having a lumpectomy and ALNB on 04/09/21. Pt was instructed in post op exercises and educated that if she has a mastectomy then not to begin exercises until a week after the last drain is removed. Baseline ROM and SOZO measurements taken today. She will benefit from a post op PT reassessment to determine needs and in every 3 months for additional L dex screening to detect subclinical lymphedema.    Stability/Clinical Decision Making Stable/Uncomplicated    Clinical Decision Making Low    Rehab Potential Good    PT Frequency --   eval and 1 f/u visit   PT Duration 8 weeks    PT Treatment/Interventions ADLs/Self Care Home Management;Patient/family  education;Therapeutic exercise    PT Next Visit Plan reassess baselines    PT Home Exercise Plan post op breast exercises    Consulted and Agree with Plan of Care Patient             Patient will benefit from skilled therapeutic intervention in order to improve the following deficits and impairments:  Postural dysfunction, Decreased knowledge of precautions  Visit Diagnosis: Abnormal posture  Malignant neoplasm of upper-outer quadrant of right breast in female, estrogen receptor positive ([place])  Patient will follow up at outpatient cancer rehab 3-4 weeks following surgery.  If the patient requires physical therapy at that time, a specific plan will be dictated and sent to the referring physician for approval. The patient was educated today on appropriate basic range of motion exercises to begin post operatively and the importance of attending the After Breast Cancer class following surgery.  Patient was educated today on lymphedema risk reduction practices as it pertains to recommendations that will benefit the patient immediately  following surgery.  She verbalized good understanding.      Problem List Patient Active Problem List   Diagnosis Date Noted   Port-A-Cath in place 11/17/2020   Malignant neoplasm of upper-outer quadrant of right breast in female, estrogen receptor positive ([place]) 10/31/2020   Recurrent umbilical hernia 51/08/1250   DVT (deep venous thrombosis) ([place]) 03/22/2019   Paroxysmal supraventricular tachycardia ([place]) 03/08/2014   Cardiac murmur 03/08/2014   Mixed hyperlipidemia 03/08/2014    Manus Gunning, PT 03/14/2021, 8:51 AM  [facility] @ [address] [place], [place], [address] Phone: [phone]   Fax:  [phone]  Name: KERRIN MARKMAN MRN: [id number] Date of Birth: 1945-12-01

## 2021-03-16 ENCOUNTER — Encounter: Payer: Self-pay | Admitting: *Deleted

## 2021-03-16 ENCOUNTER — Telehealth: Payer: Self-pay | Admitting: Hematology and Oncology

## 2021-03-16 NOTE — Telephone Encounter (Signed)
I informed the patient that the PET CT scan does not show any evidence of metastatic disease.  The bone marrow activity is related to growth factor injection.  There were few benign reactive appearing lymph nodes in the axilla.

## 2021-03-18 ENCOUNTER — Other Ambulatory Visit: Payer: Self-pay | Admitting: Hematology and Oncology

## 2021-03-23 ENCOUNTER — Other Ambulatory Visit: Payer: Self-pay

## 2021-03-23 ENCOUNTER — Encounter: Payer: Self-pay | Admitting: Adult Health

## 2021-03-23 ENCOUNTER — Inpatient Hospital Stay (HOSPITAL_BASED_OUTPATIENT_CLINIC_OR_DEPARTMENT_OTHER): Payer: Medicare HMO | Admitting: Adult Health

## 2021-03-23 ENCOUNTER — Inpatient Hospital Stay: Payer: Medicare HMO | Attending: Hematology and Oncology

## 2021-03-23 ENCOUNTER — Encounter: Payer: Self-pay | Admitting: Hematology and Oncology

## 2021-03-23 ENCOUNTER — Inpatient Hospital Stay: Payer: Medicare HMO

## 2021-03-23 VITALS — BP 127/69 | HR 71 | Temp 98.1°F | Resp 16

## 2021-03-23 VITALS — BP 145/73 | HR 81 | Temp 97.7°F | Resp 16 | Ht 62.0 in | Wt 140.0 lb

## 2021-03-23 DIAGNOSIS — Z5112 Encounter for antineoplastic immunotherapy: Secondary | ICD-10-CM | POA: Diagnosis present

## 2021-03-23 DIAGNOSIS — G620 Drug-induced polyneuropathy: Secondary | ICD-10-CM | POA: Insufficient documentation

## 2021-03-23 DIAGNOSIS — C50411 Malignant neoplasm of upper-outer quadrant of right female breast: Secondary | ICD-10-CM

## 2021-03-23 DIAGNOSIS — Z170 Estrogen receptor positive status [ER+]: Secondary | ICD-10-CM

## 2021-03-23 LAB — CBC WITH DIFFERENTIAL (CANCER CENTER ONLY)
Abs Immature Granulocytes: 0.02 10*3/uL (ref 0.00–0.07)
Basophils Absolute: 0 10*3/uL (ref 0.0–0.1)
Basophils Relative: 0 %
Eosinophils Absolute: 0 10*3/uL (ref 0.0–0.5)
Eosinophils Relative: 0 %
HCT: 30.2 % — ABNORMAL LOW (ref 36.0–46.0)
Hemoglobin: 9.3 g/dL — ABNORMAL LOW (ref 12.0–15.0)
Immature Granulocytes: 0 %
Lymphocytes Relative: 24 %
Lymphs Abs: 1.4 10*3/uL (ref 0.7–4.0)
MCH: 28 pg (ref 26.0–34.0)
MCHC: 30.8 g/dL (ref 30.0–36.0)
MCV: 91 fL (ref 80.0–100.0)
Monocytes Absolute: 0.6 10*3/uL (ref 0.1–1.0)
Monocytes Relative: 10 %
Neutro Abs: 3.7 10*3/uL (ref 1.7–7.7)
Neutrophils Relative %: 66 %
Platelet Count: 257 10*3/uL (ref 150–400)
RBC: 3.32 MIL/uL — ABNORMAL LOW (ref 3.87–5.11)
RDW: 17.1 % — ABNORMAL HIGH (ref 11.5–15.5)
WBC Count: 5.6 10*3/uL (ref 4.0–10.5)
nRBC: 0 % (ref 0.0–0.2)

## 2021-03-23 LAB — CMP (CANCER CENTER ONLY)
ALT: 9 U/L (ref 0–44)
AST: 15 U/L (ref 15–41)
Albumin: 3.9 g/dL (ref 3.5–5.0)
Alkaline Phosphatase: 76 U/L (ref 38–126)
Anion gap: 4 — ABNORMAL LOW (ref 5–15)
BUN: 14 mg/dL (ref 8–23)
CO2: 28 mmol/L (ref 22–32)
Calcium: 9.2 mg/dL (ref 8.9–10.3)
Chloride: 109 mmol/L (ref 98–111)
Creatinine: 0.55 mg/dL (ref 0.44–1.00)
GFR, Estimated: >60 mL/min (ref >60)
Glucose, Bld: 110 mg/dL — ABNORMAL HIGH (ref 70–99)
Potassium: 3.9 mmol/L (ref 3.5–5.1)
Sodium: 141 mmol/L (ref 135–145)
Total Bilirubin: 0.2 mg/dL — ABNORMAL LOW (ref 0.3–1.2)
Total Protein: 6.9 g/dL (ref 6.5–8.1)

## 2021-03-23 MED ORDER — SODIUM CHLORIDE 0.9 % IV SOLN
420.0000 mg | Freq: Once | INTRAVENOUS | Status: AC
  Administered 2021-03-23: 420 mg via INTRAVENOUS
  Filled 2021-03-23: qty 14

## 2021-03-23 MED ORDER — SODIUM CHLORIDE 0.9% FLUSH
10.0000 mL | INTRAVENOUS | Status: DC | PRN
  Administered 2021-03-23: 10 mL

## 2021-03-23 MED ORDER — SODIUM CHLORIDE 0.9 % IV SOLN: Freq: Once | INTRAVENOUS | Status: AC

## 2021-03-23 MED ORDER — DIPHENHYDRAMINE HCL 25 MG PO CAPS
25.0000 mg | ORAL_CAPSULE | Freq: Once | ORAL | Status: AC
  Administered 2021-03-23: 25 mg via ORAL
  Filled 2021-03-23: qty 1

## 2021-03-23 MED ORDER — HEPARIN SOD (PORK) LOCK FLUSH 100 UNIT/ML IV SOLN
500.0000 [IU] | Freq: Once | INTRAVENOUS | Status: AC | PRN
  Administered 2021-03-23: 500 [IU]

## 2021-03-23 MED ORDER — TRASTUZUMAB-ANNS CHEMO 150 MG IV SOLR
6.0000 mg/kg | Freq: Once | INTRAVENOUS | Status: AC
  Administered 2021-03-23: 420 mg via INTRAVENOUS
  Filled 2021-03-23: qty 20

## 2021-03-23 MED ORDER — ACETAMINOPHEN 325 MG PO TABS
650.0000 mg | ORAL_TABLET | Freq: Once | ORAL | Status: AC
  Administered 2021-03-23: 650 mg via ORAL
  Filled 2021-03-23: qty 2

## 2021-03-23 NOTE — Patient Instructions (Signed)
[facility] ONCOL[facility]  Discharge Instructions: Thank you for choosing [facility] to provide your oncology and hematology care.   If you have a lab appointment with the [facility], please go directly to the [facility] and check in at the registration area.   Wear comfortable clothing and clothing appropriate for easy access to any Portacath or PICC line.   We strive to give you quality time with your provider. You may need to reschedule your appointment if you arrive late (15 or more minutes).  Arriving late affects you and other patients whose appointments are after yours.  Also, if you miss three or more appointments without notifying the office, you may be dismissed from the clinic at the providers discretion.      For prescription refill requests, have your pharmacy contact our office and allow 72 hours for refills to be completed.    Today you received the following chemotherapy and/or immunotherapy agents : Trastuzumab and Pertuzumab      To help prevent nausea and vomiting after your treatment, we encourage you to take your nausea medication as directed.  BELOW ARE SYMPTOMS THAT SHOULD BE REPORTED IMMEDIATELY: *FEVER GREATER THAN 100.4 F (38 C) OR HIGHER *CHILLS OR SWEATING *NAUSEA AND VOMITING THAT IS NOT CONTROLLED WITH YOUR NAUSEA MEDICATION *UNUSUAL SHORTNESS OF BREATH *UNUSUAL BRUISING OR BLEEDING *URINARY PROBLEMS (pain or burning when urinating, or frequent urination) *BOWEL PROBLEMS (unusual diarrhea, constipation, pain near the anus) TENDERNESS IN MOUTH AND THROAT WITH OR WITHOUT PRESENCE OF ULCERS (sore throat, sores in mouth, or a toothache) UNUSUAL RASH, SWELLING OR PAIN  UNUSUAL VAGINAL DISCHARGE OR ITCHING   Items with * indicate a potential emergency and should be followed up as soon as possible or go to the Emergency Department if any problems should occur.  Please show the CHEMOTHERAPY ALERT CARD or IMMUNOTHERAPY ALERT  CARD at check-in to the Emergency Department and triage nurse.  Should you have questions after your visit or need to cancel or reschedule your appointment, please contact [facility]  Dept: [phone]  and follow the prompts.  Office hours are 8:00 a.m. to 4:30 p.m. Monday - Friday. Please note that voicemails left after 4:00 p.m. may not be returned until the following business day.  We are closed weekends and major holidays. You have access to a nurse at all times for urgent questions. Please call the main number to the clinic Dept: [phone] and follow the prompts.   For any non-urgent questions, you may also contact your provider using MyChart. We now offer e-Visits for anyone 1 and older to request care online for non-urgent symptoms. For details visit [email or web address].   Also download the MyChart app! Go to the app store, search "MyChart", open the app, select [facility], and log in with your MyChart username and password.  Due to Covid, a mask is required upon entering the hospital/clinic. If you do not have a mask, one will be given to you upon arrival. For doctor visits, patients may have 1 support person aged 23 or older with them. For treatment visits, patients cannot have anyone with them due to current Covid guidelines and our immunocompromised population.

## 2021-03-23 NOTE — Patient Instructions (Signed)

## 2021-03-23 NOTE — Progress Notes (Signed)
[facility] Follow up:    Lisa Chroman, MD [address] [place] [address]   DIAGNOSIS:  Cancer Staging  Malignant neoplasm of upper-outer quadrant of right breast in female, estrogen receptor positive ([place]) Staging form: Breast, AJCC 8th Edition - Clinical stage from 10/31/2020: Stage IIA (cT2, cN1, cM0, G2, ER+, PR-, HER2+) - Signed by Nicholas Lose, MD on 10/31/2020 Stage prefix: Initial diagnosis Histologic grading system: 3 grade system   SUMMARY OF ONCOLOGIC HISTORY: Oncology History  Malignant neoplasm of upper-outer quadrant of right breast in female, estrogen receptor positive ([place])  10/04/2020 Initial Diagnosis   Palpable lump in the right breast, mammogram detected 3.6 cm mass with several axillary lymph nodes, biopsy revealed grade 1-2 IDC, lymph nodes positive for cancer, ER 40%, PR 0%, Ki-67 15%, HER2 positive 3+ by [facility]   10/31/2020 Cancer Staging   Staging form: Breast, AJCC 8th Edition - Clinical stage from 10/31/2020: Stage IIA (cT2, cN1, cM0, G2, ER+, PR-, HER2+) - Signed by Nicholas Lose, MD on 10/31/2020 Stage prefix: Initial diagnosis Histologic grading system: 3 grade system    11/17/2020 -  Chemotherapy   Patient is on Treatment Plan : BREAST  Docetaxel + Carboplatin + Trastuzumab + Pertuzumab  (TCHP) q21d        CURRENT THERAPY: Herceptin/Perjeta  INTERVAL HISTORY: Lisa Chapman 76 y.o. female returns for evaluation prior to receiving Herceptin Perjeta.  Lisa Chapman is doing well today and her most recent echo was completed on 02/12/2021 and showed an LVEF of 60-65%.    Lisa Chapman underwent a bilateral breast MRI on 03/05/2021 that showed a complete imaging resolution of her known right breast malignancy and metastatic right axillary lymph nodes.  Diffuse enhancement of the sternum was present and PET scan was recommended.  Pet scan was completed on 03/14/2020 that were consistent with GCSF effect on the marrow.     Patient Active Problem List   Diagnosis  Date Noted   Port-A-Cath in place 11/17/2020   Malignant neoplasm of upper-outer quadrant of right breast in female, estrogen receptor positive ([place]) 10/31/2020   Recurrent umbilical hernia 07/37/1062   DVT (deep venous thrombosis) ([place]) 03/22/2019   Paroxysmal supraventricular tachycardia ([place]) 03/08/2014   Cardiac murmur 03/08/2014   Mixed hyperlipidemia 03/08/2014    is allergic to ibuprofen, other, boniva [ibandronic acid], and cortisone.  MEDICAL HISTORY: Past Medical History:  Diagnosis Date   Cancer ([place]) 2022   Breast Cancer   Diverticulosis    Dysrhythmia    Heart murmur    History of colonic polyps    History of DVT (deep vein thrombosis)    Right leg, April 2015   History of shingles    Mixed hyperlipidemia     SURGICAL HISTORY: Past Surgical History:  Procedure Laterality Date   HERNIA REPAIR     MENISCUS REPAIR Left 03/2019   PORTACATH PLACEMENT Left 11/03/2020   Procedure: INSERTION PORT-A-CATH;  Surgeon: Coralie Keens, MD;  Location: [facility];  Service: General;  Laterality: Left;   TONSILLECTOMY      SOCIAL HISTORY: Social History   Socioeconomic History   Marital status: Married    Spouse name: Not on file   Number of children: Not on file   Years of education: Not on file   Highest education level: Not on file  Occupational History   Not on file  Tobacco Use   Smoking status: Never   Smokeless tobacco: Never  Vaping Use   Vaping Use: Never  used  Substance and Sexual Activity   Alcohol use: No    Alcohol/week: 0.0 standard drinks   Drug use: No   Sexual activity: Not on file  Other Topics Concern   Not on file  Social History Narrative   Married. No children   Social Determinants of Radio broadcast assistant Strain: Not on file  Food Insecurity: Not on file  Transportation Needs: Not on file  Physical Activity: Not on file  Stress: Not on file  Social Connections: Not on file  Intimate Partner Violence: Not on file    FAMILY  HISTORY: Family History  Problem Relation Age of Onset   Heart attack Mother    COPD Father    Heart attack Father    Emphysema Father     Review of Systems  Constitutional:  Negative for appetite change, chills, fatigue, fever and unexpected weight change.  HENT:   Negative for hearing loss, lump/mass and trouble swallowing.   Eyes:  Negative for eye problems and icterus.  Respiratory:  Negative for chest tightness, cough and shortness of breath.   Cardiovascular:  Negative for chest pain, leg swelling and palpitations.  Gastrointestinal:  Negative for abdominal distention, abdominal pain, constipation, diarrhea, nausea and vomiting.  Endocrine: Negative for hot flashes.  Genitourinary:  Negative for difficulty urinating.   Musculoskeletal:  Negative for arthralgias.  Skin:  Negative for itching and rash.  Neurological:  Negative for dizziness, extremity weakness, headaches and numbness.  Hematological:  Negative for adenopathy. Does not bruise/bleed easily.  Psychiatric/Behavioral:  Negative for depression. The patient is not nervous/anxious.      PHYSICAL EXAMINATION  ECOG PERFORMANCE STATUS: 1  Vitals:   03/23/21 1023  BP: (!) 145/73  Pulse: 81  Resp: 16  Temp: 97.7 F (36.5 C)  SpO2: 100%    Physical Exam Constitutional:      General: Lisa Chapman is not in acute distress.    Appearance: Normal appearance. Lisa Chapman is not toxic-appearing.  HENT:     Head: Normocephalic and atraumatic.  Eyes:     General: No scleral icterus. Cardiovascular:     Rate and Rhythm: Normal rate and regular rhythm.     Pulses: Normal pulses.     Heart sounds: Normal heart sounds.  Pulmonary:     Effort: Pulmonary effort is normal.     Breath sounds: Normal breath sounds.  Abdominal:     General: Abdomen is flat. Bowel sounds are normal. There is no distension.     Palpations: Abdomen is soft.     Tenderness: There is no abdominal tenderness.  Musculoskeletal:        General: No swelling.      Cervical back: Neck supple.  Lymphadenopathy:     Cervical: No cervical adenopathy.  Skin:    General: Skin is warm and dry.     Findings: No rash.  Neurological:     General: No focal deficit present.     Mental Status: Lisa Chapman is alert.  Psychiatric:        Mood and Affect: Mood normal.        Behavior: Behavior normal.    LABORATORY DATA:  CBC    Component Value Date/Time   WBC 5.6 03/23/2021 1020   WBC 6.6 10/27/2020 1023   RBC 3.32 (L) 03/23/2021 1020   HGB 9.3 (L) 03/23/2021 1020   HCT 30.2 (L) 03/23/2021 1020   PLT 257 03/23/2021 1020   MCV 91.0 03/23/2021 1020   MCH 28.0 03/23/2021  1020   MCHC 30.8 03/23/2021 1020   RDW 17.1 (H) 03/23/2021 1020   LYMPHSABS 1.4 03/23/2021 1020   MONOABS 0.6 03/23/2021 1020   EOSABS 0.0 03/23/2021 1020   BASOSABS 0.0 03/23/2021 1020    CMP     Component Value Date/Time   NA 141 03/23/2021 1020   K 3.9 03/23/2021 1020   CL 109 03/23/2021 1020   CO2 28 03/23/2021 1020   GLUCOSE 110 (H) 03/23/2021 1020   BUN 14 03/23/2021 1020   CREATININE 0.55 03/23/2021 1020   CALCIUM 9.2 03/23/2021 1020   PROT 6.9 03/23/2021 1020   ALBUMIN 3.9 03/23/2021 1020   AST 15 03/23/2021 1020   ALT 9 03/23/2021 1020   ALKPHOS 76 03/23/2021 1020   BILITOT 0.2 (L) 03/23/2021 1020   GFRNONAA >60 03/23/2021 1020         ASSESSMENT and THERAPY PLAN:   Malignant neoplasm of upper-outer quadrant of right breast in female, estrogen receptor positive ([facility]) Palpable lump in the right breast, mammogram detected 3.6 cm mass with several axillary lymph nodes, biopsy revealed grade 1-2 IDC, lymph nodes positive for cancer, ER 40%, PR 0%, Ki-67 15%, HER2 positive 3+ by IHC   Treatment plan: 1. Neoadjuvant chemotherapy with TCH Perjeta 6 cycles followed by Herceptin Perjeta maintenance versus Kadcyla maintenance (based on response to neoadjuvant chemo) for 1 year 2. Followed by breast conserving surgery if possible with sentinel lymph node study 3.  Followed by adjuvant radiation therapy if patient had lumpectomy  __________________________________________________________________________________ Current treatment: Herceptin/Perjeta Port site infection: Resolved Echo from 02/12/2021 reviewed, EF 60-65%  Chemo toxicities: 1.  Diarrhea: resolved 2.  Grade 1 peripheral neuropathy: unchanged  Lisa Chapman will undergo surgery on 04/05/2021.  Lisa Chapman will return in 3 weeks for labs, f/u with Dr. Lindi Adie, and to discuss her pathology results.      All questions were answered. The patient knows to call the clinic with any problems, questions or concerns. We can certainly see the patient much sooner if necessary.  Total encounter time: 20 minutes in face to face visit time, chart review, lab review, care coordination, order entry, and documentation of the encounter.   Wilber Bihari, NP 03/26/21 6:57 PM Medical Oncology and Hematology [facility] [place], [place] [address] Tel. [phone]    Fax. [phone]  *Total Encounter Time as defined by the Centers for Medicare and Medicaid Services includes, in addition to the face-to-face time of a patient visit (documented in the note above) non-face-to-face time: obtaining and reviewing outside history, ordering and reviewing medications, tests or procedures, care coordination (communications with other health care professionals or caregivers) and documentation in the medical record.

## 2021-03-23 NOTE — Assessment & Plan Note (Addendum)
Palpable lump in the right breast, mammogram detected 3.6 cm mass with several axillary lymph nodes, biopsy revealed grade 1-2 IDC, lymph nodes positive for cancer, ER 40%, PR 0%, Ki-67 15%, HER2 positive 3+ by IHC  Treatment plan: 1. Neoadjuvant chemotherapy with TCH Perjeta 6 cycles followed by Herceptin Perjeta maintenance versus Kadcyla maintenance (based on response to neoadjuvant chemo) for 1 year 2. Followed by breast conserving surgery if possible with sentinel lymph node study 3. Followed by adjuvant radiation therapy if patient had lumpectomy __________________________________________________________________________________ Current treatment:Herceptin/Perjeta Port site infection:Resolved Echo from 02/12/2021 reviewed, EF 60-65%  Chemo toxicities: 1.  Diarrhea: resolved 2.  Grade 1 peripheral neuropathy: unchanged  Lisa Chapman will undergo surgery on 04/05/2021.  She will return in 3 weeks for labs, f/u with Dr. Lindi Adie, and to discuss her pathology results.

## 2021-03-26 ENCOUNTER — Encounter: Payer: Self-pay | Admitting: Hematology and Oncology

## 2021-03-29 ENCOUNTER — Encounter (HOSPITAL_BASED_OUTPATIENT_CLINIC_OR_DEPARTMENT_OTHER): Payer: Self-pay | Admitting: Surgery

## 2021-03-29 ENCOUNTER — Telehealth: Payer: Self-pay

## 2021-03-29 NOTE — Telephone Encounter (Signed)
° °  Pre-operative Risk Assessment    Patient Name: Lisa Chapman  DOB: Sep 20, 1945 MRN: [id number]   Request for Surgical Clearance  Procedure:   Right Breast Lumpectomy with Sentinel node axillary disection  Date of Surgery:  Clearance 04/09/21                               Surgeon:  Dr Coralie Keens Surgeon's Group or Practice Name:  [facility]  Phone number:  [phone] (Contact person Malachi Bonds, [place])  Fax number:  [phone]  Type of Clearance Requested:   - Medical  - Pharmacy:  Hold Rivaroxaban (Xarelto)    Type of Anesthesia:  General   Additional requests/questions:   N/A  Job Founds T   03/29/2021, 4:03 PM

## 2021-03-29 NOTE — Progress Notes (Signed)
LVM with Dr. Trevor Mace office about orders for patient to hold xarelto

## 2021-03-29 NOTE — Telephone Encounter (Signed)
° °  Name: BRECKLYNN JIAN  DOB: [birth year]  MRN: [id number]  Primary Cardiologist: None  Chart reviewed as part of pre-operative protocol coverage. Because of Abigale Dorow Douthitt's past medical history and time since last visit, she will require a follow-up visit in order to better assess preoperative cardiovascular risk.   Pt has not seen [place] since 2016. Last saw cardiology at [facility] in 2021. Unclear who she is following with now. Please let them know. If she needs clearance from [place], will need an appt.   Pre-op covering staff: - Please schedule appointment and call patient to inform them. If patient already had an upcoming appointment within acceptable timeframe, please add "pre-op clearance" to the appointment notes so provider is aware. - Please contact requesting surgeon's office via preferred method (i.e, phone, fax) to inform them of need for appointment prior to surgery.  If applicable, this message will also be routed to pharmacy pool and/or primary cardiologist for input on holding anticoagulant/antiplatelet agent as requested below so that this information is available to the clearing provider at time of patient's appointment.   Tami Lin Mattox Schorr, PA  03/29/2021, 5:27 PM

## 2021-03-30 NOTE — Telephone Encounter (Signed)
I tried to reach the pt to first confirm who is she seeing as her primary cardiologist, as notes states she was seen by [facility] 2021. If pt is still wanting to follow with our practice the pt will need a NEW PT APPT as she was last seen 2016 by [facility]. If pt is continuing to follow [facility], the surgeon's office will need to reach out to them for clearance. At this time though I will update the requesting office not sure which cardiology office the pt is following, if with [place] she will need a NEW PT APPT.

## 2021-03-30 NOTE — Telephone Encounter (Signed)
VM  is full could not leave message

## 2021-04-02 NOTE — Telephone Encounter (Signed)
Disregard Cardiac clearance Received: Today Leata Mouse, CMA Cardiac clearance was sent in Error.  I faxed over notification of this but I guess it wasn't received as her Xarelto was prescribed by hematology.  Patient doesn't need cardiac clearance.    Thanks.  Chemira   I will update our pre op provider to disregard clearance request. See notes above from Malachi Bonds, [place].

## 2021-04-02 NOTE — Progress Notes (Signed)
Spoke with Audelia Acton, nurse at [facility]. She states that the cardiac clearance was sent by mistake and that she will reach out to patient to notify that Xarelto should be held 48 hours prior to sx, per Dr. Lindi Adie, who prescribed this medication.

## 2021-04-05 ENCOUNTER — Ambulatory Visit
Admission: RE | Admit: 2021-04-05 | Discharge: 2021-04-05 | Disposition: A | Discharge status: Home / Self Care | Payer: Medicare HMO | Source: Ambulatory Visit | Attending: Surgery | Admitting: Surgery

## 2021-04-05 DIAGNOSIS — Z853 Personal history of malignant neoplasm of breast: Secondary | ICD-10-CM

## 2021-04-05 DIAGNOSIS — C773 Secondary and unspecified malignant neoplasm of axilla and upper limb lymph nodes: Secondary | ICD-10-CM | POA: Diagnosis not present

## 2021-04-05 DIAGNOSIS — C50911 Malignant neoplasm of unspecified site of right female breast: Secondary | ICD-10-CM | POA: Diagnosis not present

## 2021-04-05 IMAGING — MG MM PLC BREAST LOC DEV 1ST LESION INC*R*
8 of 18 series · 8 of 18 positions shown · non-contrast
Comparison: Previous exam(s).

CLINICAL DATA: Patient presents for radioactive seed localization
of biopsy-proven malignancy over the right breast and biopsy-proven
metastatic right axillary lymph node.

EXAM:
MAMMOGRAPHIC GUIDED RADIOACTIVE SEED LOCALIZATION OF THE RIGHT
BREAST

[R CC (1 of 4)]
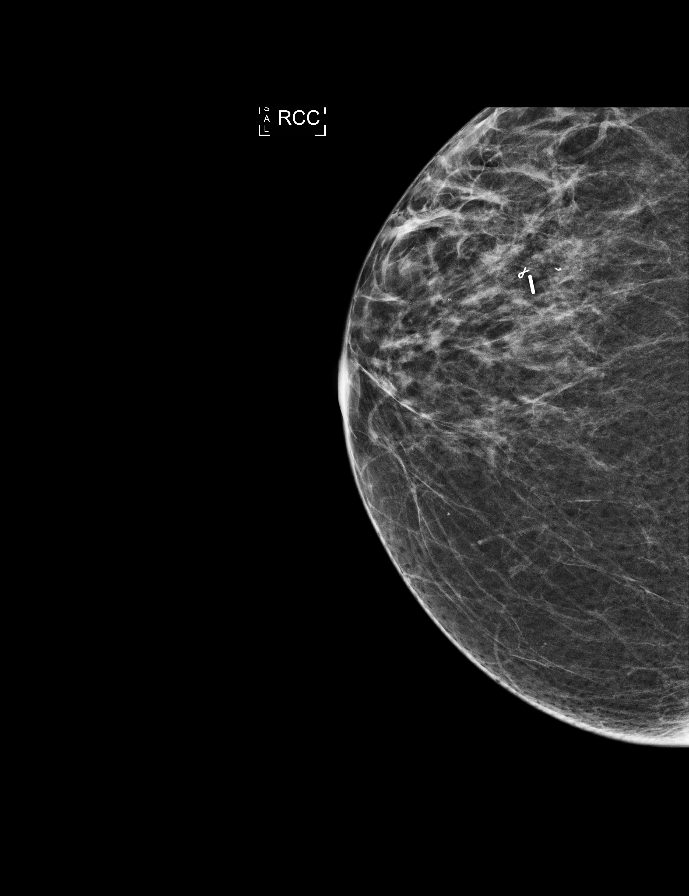

[R LM (1 of 3)]
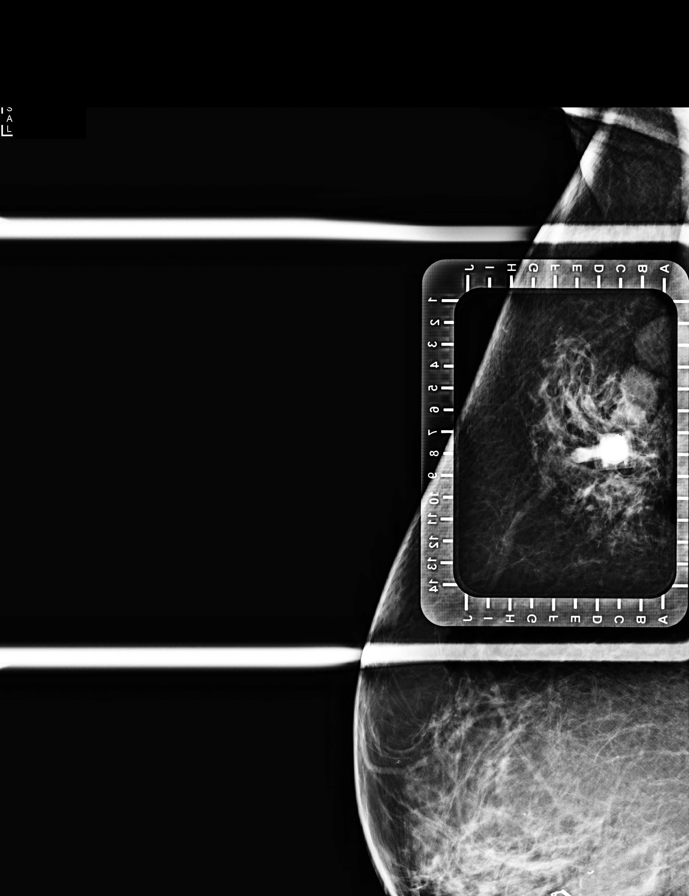

[R XCCL]
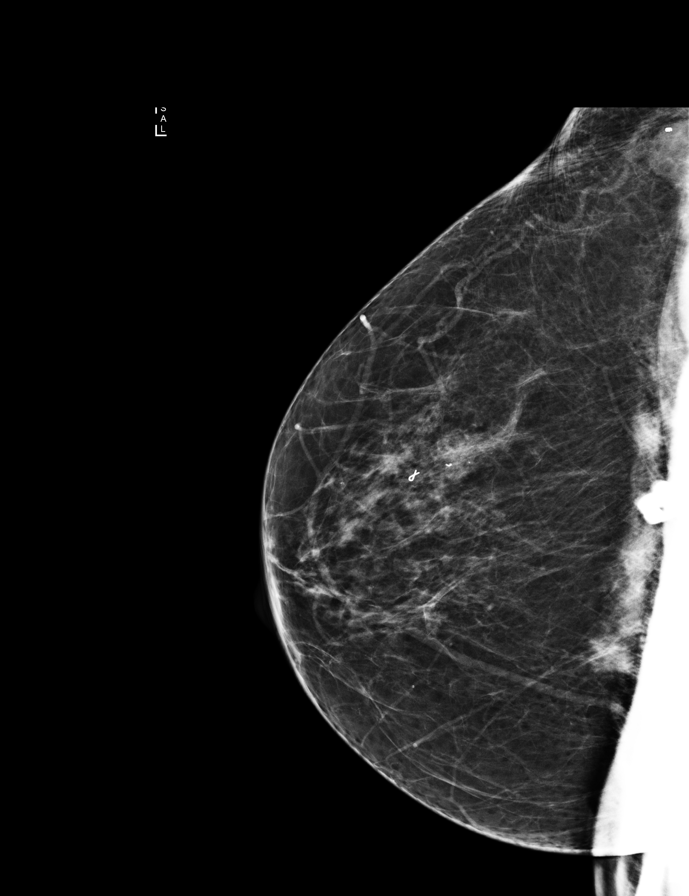

[R LM (2 of 3)]
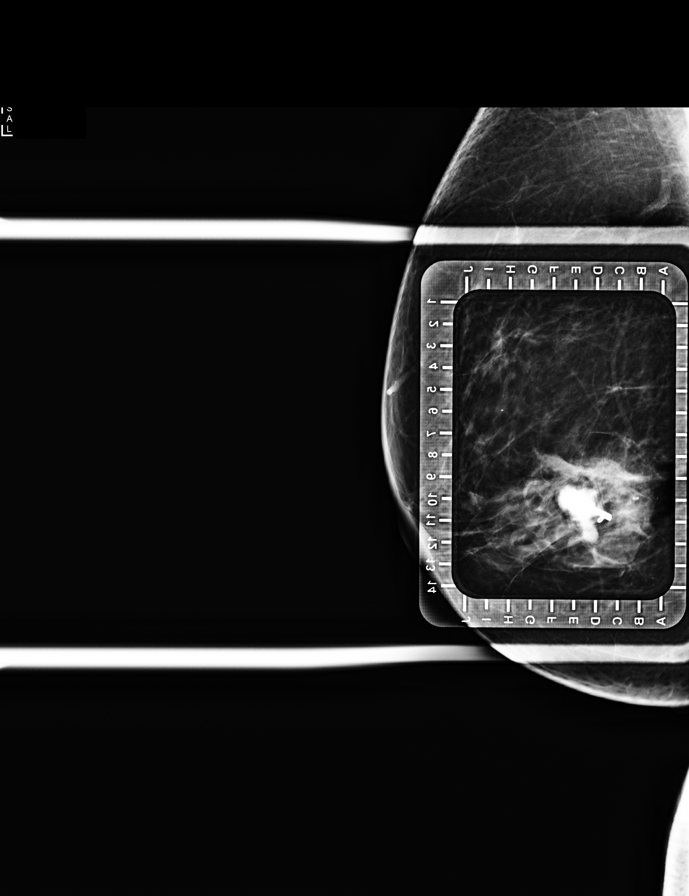

[R LM (3 of 3)]
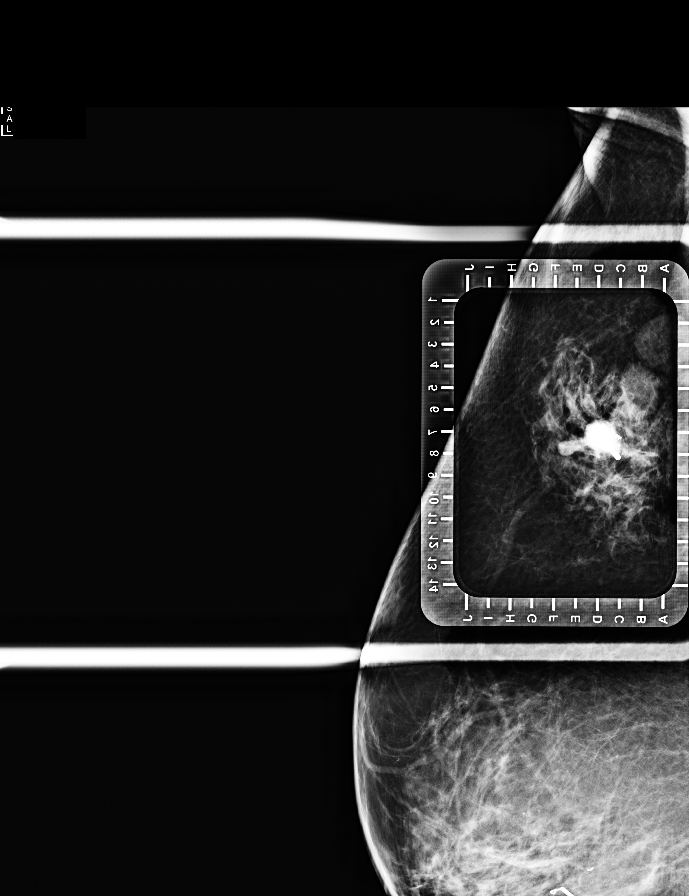

[R CC (2 of 4)]
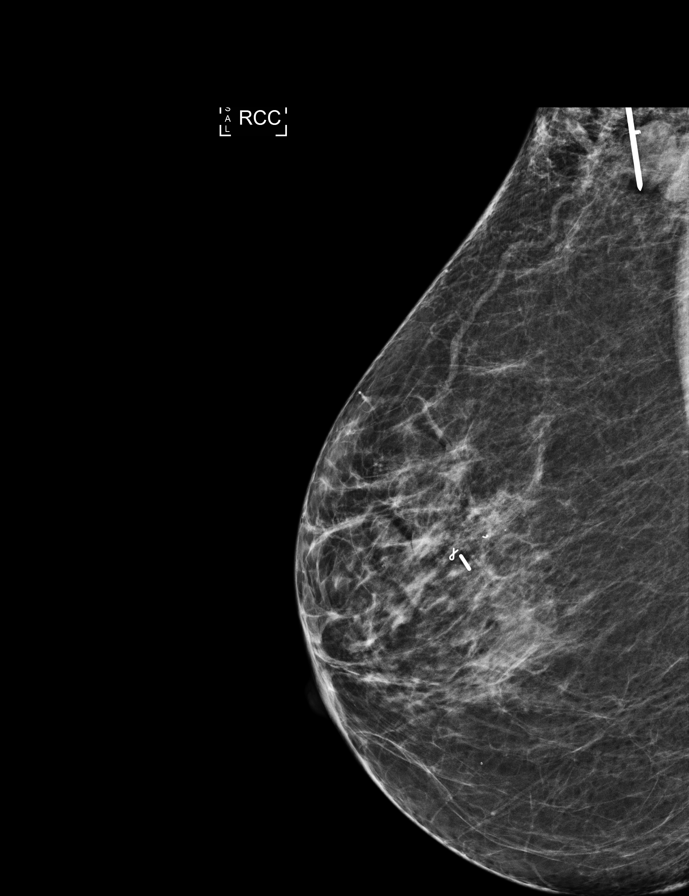

[R CC (3 of 4)]
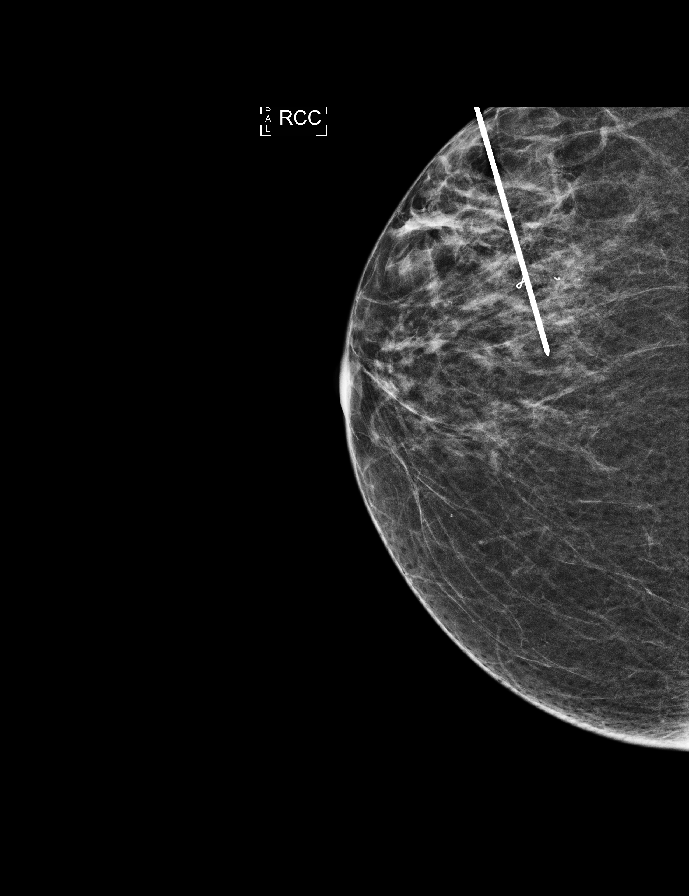

[R CC (4 of 4)]
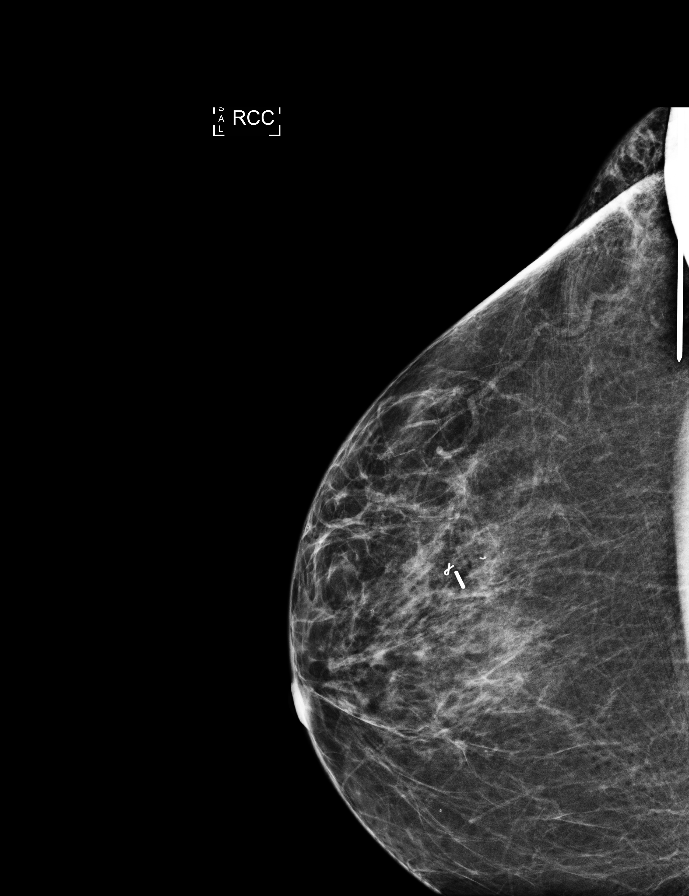

[8 of 18 positions shown; findings below may reference images not displayed]

FINDINGS: Patient presents for radioactive seed localization prior to . I met
with the patient and we discussed the procedure of seed localization
including benefits and alternatives. We discussed the high
likelihood of a successful procedure. We discussed the risks of the
procedure including infection, bleeding, tissue injury and further
surgery. We discussed the low dose of radioactivity involved in the
procedure. Informed, written consent was given.

The usual time-out protocol was performed immediately prior to the
procedure.

1. Using mammographic guidance, sterile technique, 1% lidocaine and
an [L3] radioactive seed, the targeted ribbon clip was localized
using a lateral to medial approach. The radioactive seed lies
immediately posteromedial to the targeted clip. The follow-up
mammogram images confirm the seed in the expected location and were
marked for Dr. SOLOMONS.

Follow-up survey of the patient confirms presence of the radioactive
seed.

Order number of [L3] seed:  [PHONE_NUMBER].

Total activity:  0.245 millicurie reference Date: [DATE]

[DATE]. Using mammographic guidance, sterile technique, 1% lidocaine and
an [L3] radioactive seed, the targeted HydroMARK clip over the
metastatic lower right axillary lymph node was localized using a
lateral to medial approach. The radioactive seed lies directly at
the clip on the exaggerated cc image and 5 mm inferior to the clip
on the lateral image. The follow-up mammogram images confirm the
seed in the expected location and were marked for Dr. SOLOMONS.

Follow-up survey of the patient confirms presence of the radioactive
seed.

Order number of [L3] seed:  [PHONE_NUMBER].

Total activity:  0.245 millicurie reference Date: [DATE]

The patient tolerated the procedure well and was released from the
[REDACTED]. She was given instructions regarding seed removal.
IMPRESSION: Radioactive seed localization right breast and axilla as described.
No apparent complications.

## 2021-04-08 NOTE — H&P (Signed)
PROVIDER: Beverlee Nims, MD  MRN: [id number] DOB: [birth year]  Subjective   Chief Complaint: recheck breast   History of Present Illness: Lisa Chapman is a 76 y.o. female who is seen today for evaluation of her right breast cancer. She has been undergoing chemotherapy. She had a follow-up MRI which showed complete resolution of the 4 cm mass as well as resolution of the lymph nodes. Her original scan also showed at least 9 enlarged lymph nodes. 1 had already been biopsied which was positive. She is doing very well. The MRI did show some enhancement of the sternum so bone scan has been ordered..    Review of Systems: A complete review of systems was obtained from the patient. I have reviewed this information and discussed as appropriate with the patient. See HPI as well for other ROS.  ROS   Medical History: Past Medical History:  Diagnosis Date   DVT (deep venous thrombosis) (CMS-HCC)   Patient Active Problem List  Diagnosis   Abdominal pain   Cardiac murmur   DVT (deep venous thrombosis) (CMS-HCC)   Dyslipidemia   Mixed hyperlipidemia   Paroxysmal supraventricular tachycardia (CMS-HCC)   Recurrent umbilical hernia   Ventral hernia without obstruction or gangrene   Past Surgical History:  Procedure Laterality Date   Knee Surgery 03/2019   HERNIA REPAIR    Allergies  Allergen Reactions   Cortisone Hives   Ibuprofen Hives  BUT HAS HAD NAPROXEN WITHOUT DIFFICULTY   Ibandronic Acid Other (See Comments)  Epigastric discomfort Epigastric discomfort   Current Outpatient Medications on File Prior to Visit  Medication Sig Dispense Refill   rivaroxaban (XARELTO) 20 mg tablet TAKE 1 TABLET (20 MG TOTAL) BY MOUTH DAILY WITH SUPPER.   rosuvastatin (CRESTOR) 20 MG tablet Take 20 mg by mouth at bedtime   No current facility-administered medications on file prior to visit.   History reviewed. No pertinent family history.   Social History   Tobacco Use   Smoking Status Former   Types: Cigarettes   Quit date: 1996   Years since quitting: 27.0  Smokeless Tobacco Never    Social History   Socioeconomic History   Marital status: Married  Tobacco Use   Smoking status: Former  Types: Cigarettes  Quit date: 1996  Years since quitting: 27.0   Smokeless tobacco: Never  Vaping Use   Vaping Use: Never used  Substance and Sexual Activity   Alcohol use: Not Currently   Drug use: Never   Sexual activity: Never   Objective:   There were no vitals filed for this visit.  There is no height or weight on file to calculate BMI.  Physical Exam   She looks well on exam  On right breast exam, there is now no palpable mass at the 9 o'clock position. The nipple areolar complex is normal. I can also palpate no enlarged lymph nodes in the right axilla.  Her Port-A-Cath site looks good  Lungs clear CV RRR Abdomen soft, NT  Labs, Imaging and Diagnostic Testing:  I have reviewed her most recent MRI  Assessment and Plan:   Diagnoses and all orders for this visit:  Breast cancer metastasized to axillary lymph node, right (CMS-HCC)    We have discussed her in our multidisciplinary breast cancer conference. She has had an excellent result with the chemotherapy with resolution of the mass in the right breast as well as the adenopathy. We will now proceed with a radioactive seed guided right breast lumpectomy and  targeted right axillary lymph node dissection. I discussed the procedures with her and her husband in detail. We discussed the risks which includes but is not limited to bleeding, infection, the need for further procedures if the margins are positive, seroma formation, cardiopulmonary issues, postoperative recovery, etc. They understand and agree to proceed with surgery which will be scheduled.

## 2021-04-09 ENCOUNTER — Encounter (HOSPITAL_BASED_OUTPATIENT_CLINIC_OR_DEPARTMENT_OTHER)
Admission: RE | Disposition: A | Discharge status: Home / Self Care | Payer: Self-pay | Source: Ambulatory Visit | Attending: Surgery

## 2021-04-09 ENCOUNTER — Other Ambulatory Visit: Payer: Self-pay

## 2021-04-09 ENCOUNTER — Encounter (HOSPITAL_BASED_OUTPATIENT_CLINIC_OR_DEPARTMENT_OTHER): Payer: Self-pay | Admitting: Surgery

## 2021-04-09 ENCOUNTER — Ambulatory Visit (HOSPITAL_BASED_OUTPATIENT_CLINIC_OR_DEPARTMENT_OTHER): Payer: Medicare HMO | Admitting: Anesthesiology

## 2021-04-09 ENCOUNTER — Ambulatory Visit
Admission: RE | Admit: 2021-04-09 | Discharge: 2021-04-09 | Disposition: A | Discharge status: Home / Self Care | Payer: Medicare HMO | Source: Ambulatory Visit | Attending: Surgery | Admitting: Surgery

## 2021-04-09 ENCOUNTER — Ambulatory Visit (HOSPITAL_BASED_OUTPATIENT_CLINIC_OR_DEPARTMENT_OTHER)
Admission: RE | Admit: 2021-04-09 | Discharge: 2021-04-09 | Disposition: A | Discharge status: Home / Self Care | Payer: Medicare HMO | Source: Ambulatory Visit | Attending: Surgery | Admitting: Surgery

## 2021-04-09 DIAGNOSIS — I471 Supraventricular tachycardia: Secondary | ICD-10-CM

## 2021-04-09 DIAGNOSIS — Z86718 Personal history of other venous thrombosis and embolism: Secondary | ICD-10-CM | POA: Diagnosis not present

## 2021-04-09 DIAGNOSIS — C50911 Malignant neoplasm of unspecified site of right female breast: Secondary | ICD-10-CM

## 2021-04-09 DIAGNOSIS — Z853 Personal history of malignant neoplasm of breast: Secondary | ICD-10-CM

## 2021-04-09 DIAGNOSIS — R920 Mammographic microcalcification found on diagnostic imaging of breast: Secondary | ICD-10-CM | POA: Diagnosis not present

## 2021-04-09 DIAGNOSIS — C50411 Malignant neoplasm of upper-outer quadrant of right female breast: Secondary | ICD-10-CM | POA: Diagnosis not present

## 2021-04-09 DIAGNOSIS — N6021 Fibroadenosis of right breast: Secondary | ICD-10-CM | POA: Diagnosis not present

## 2021-04-09 DIAGNOSIS — N6011 Diffuse cystic mastopathy of right breast: Secondary | ICD-10-CM | POA: Diagnosis not present

## 2021-04-09 DIAGNOSIS — R928 Other abnormal and inconclusive findings on diagnostic imaging of breast: Secondary | ICD-10-CM | POA: Diagnosis not present

## 2021-04-09 DIAGNOSIS — I898 Other specified noninfective disorders of lymphatic vessels and lymph nodes: Secondary | ICD-10-CM | POA: Diagnosis not present

## 2021-04-09 DIAGNOSIS — Z7901 Long term (current) use of anticoagulants: Secondary | ICD-10-CM | POA: Diagnosis not present

## 2021-04-09 DIAGNOSIS — N6031 Fibrosclerosis of right breast: Secondary | ICD-10-CM | POA: Diagnosis not present

## 2021-04-09 DIAGNOSIS — G8918 Other acute postprocedural pain: Secondary | ICD-10-CM | POA: Diagnosis not present

## 2021-04-09 DIAGNOSIS — R590 Localized enlarged lymph nodes: Secondary | ICD-10-CM | POA: Diagnosis not present

## 2021-04-09 HISTORY — PX: RADIOACTIVE SEED GUIDED AXILLARY SENTINEL LYMPH NODE: SHX6735

## 2021-04-09 HISTORY — PX: BREAST LUMPECTOMY: SHX2

## 2021-04-09 HISTORY — PX: BREAST LUMPECTOMY WITH RADIOACTIVE SEED LOCALIZATION: SHX6424

## 2021-04-09 IMAGING — MG MM BREAST SURGICAL SPECIMEN
1 series · 2 of 2 positions shown · non-contrast
Comparison: Previous exam(s).

CLINICAL DATA: Status post targeted lymph node dissection today
after earlier radioactive seed localization.

EXAM:
SPECIMEN RADIOGRAPH OF THE RIGHT AXILLA

[Series 2: R · right · 0.07mm/px · 2 of 2 slices shown]
[im 1/2]
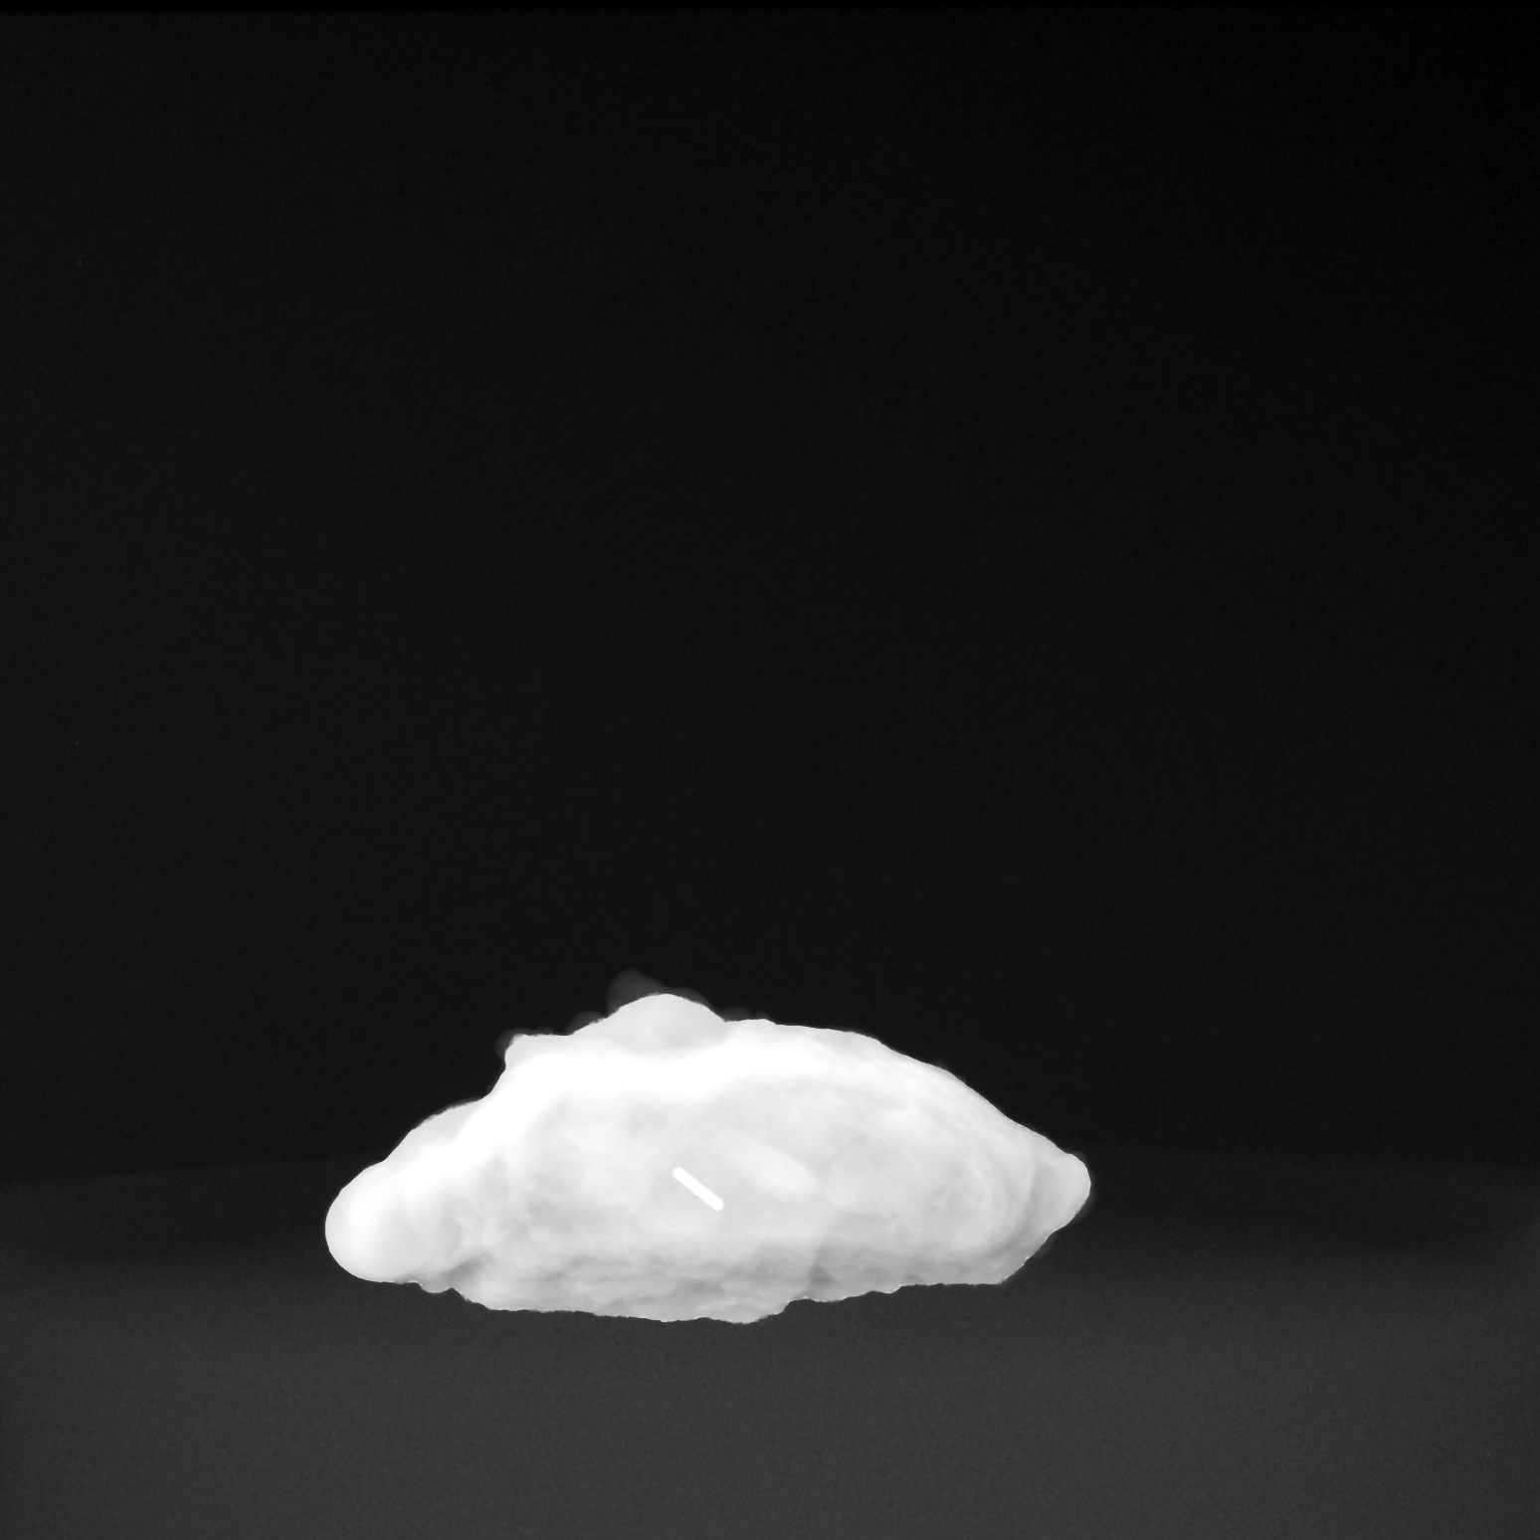
[im 2/2]
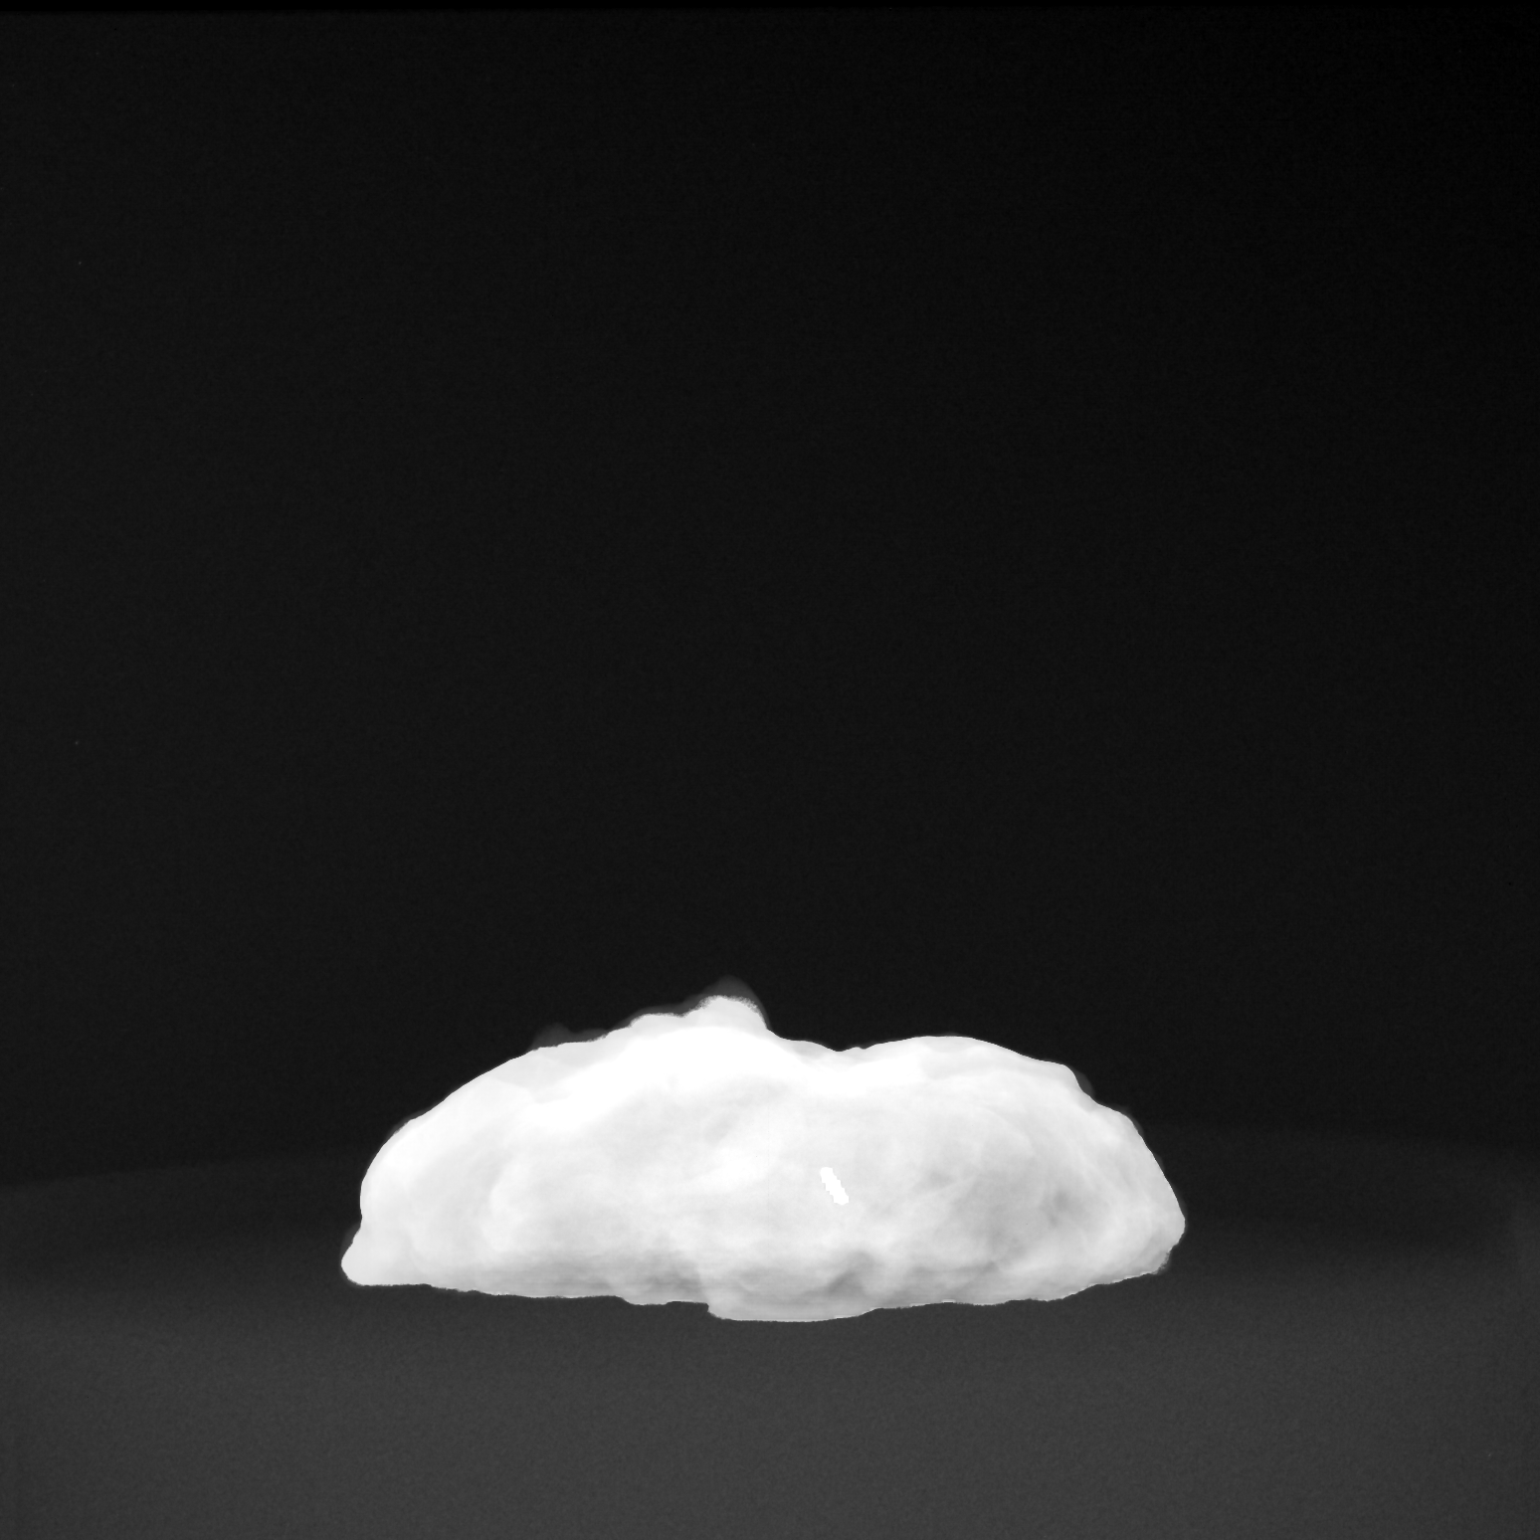

[2 of 2 positions shown; findings below may reference images not displayed]

FINDINGS: Status post excision of the RIGHT axilla. The radioactive seed is
seen within the specimen. Multiple lymph nodes are also identifiable
within the specimen. The HydroMARK clip, however, is not within the
specimen.
IMPRESSION: Specimen radiograph of the RIGHT axilla. Radioactive seed is present
within the specimen. HydroMARK clip is not seen within the specimen.
On the seed localization films (lateral view), the seed appears to
be approximately 5 mm below the level of the HydroMARK clip). These
findings were discussed with the OR staff during the procedure.

## 2021-04-09 IMAGING — MG MM BREAST SURGICAL SPECIMEN
1 series · 2 of 2 positions shown · non-contrast
Comparison: Previous exam(s).

CLINICAL DATA: Status post lumpectomy today after earlier
radioactive seed localization.

EXAM:
SPECIMEN RADIOGRAPH OF THE RIGHT BREAST

[Series 2: R · right · 0.07mm/px · 2 of 2 slices shown]
[im 1/2]
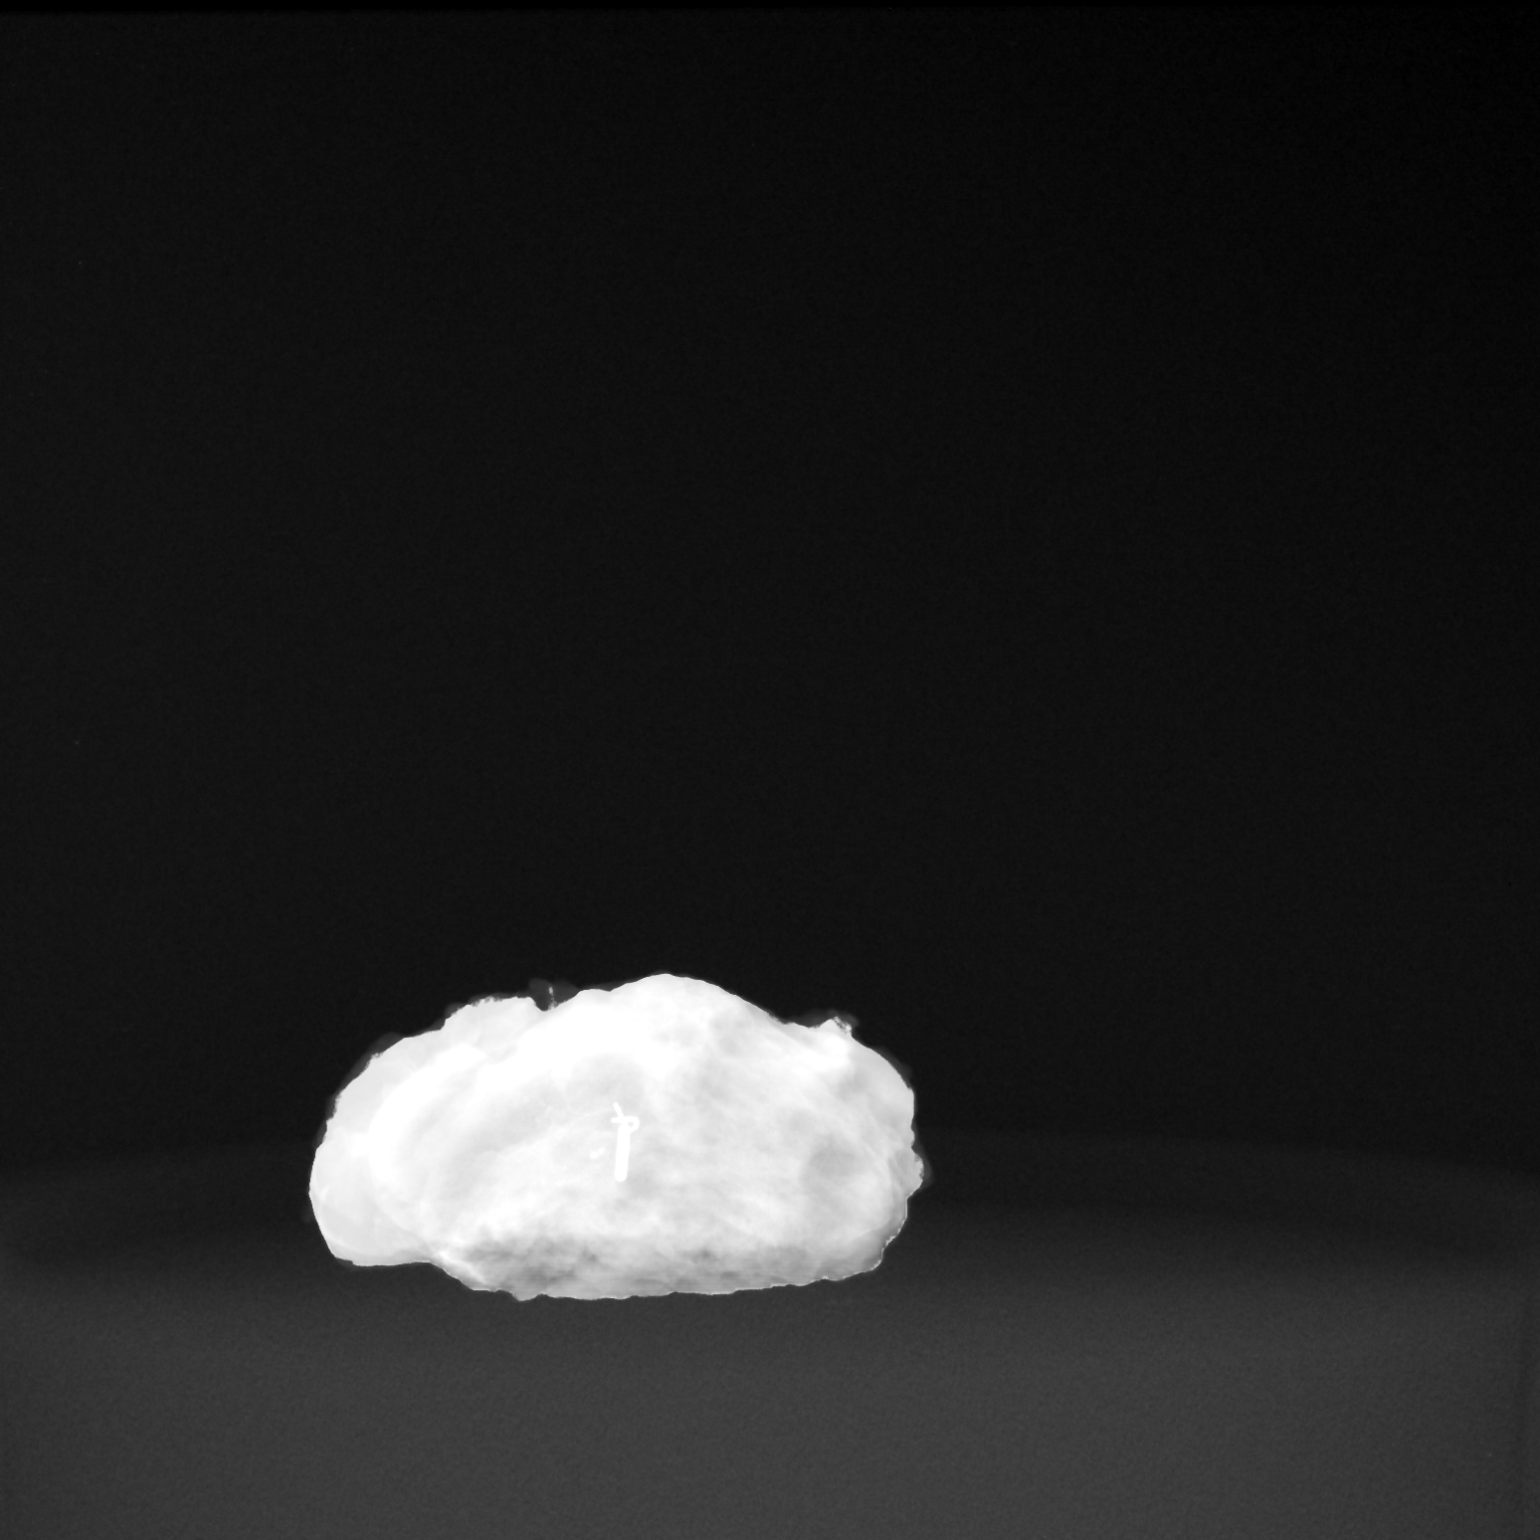
[im 2/2]
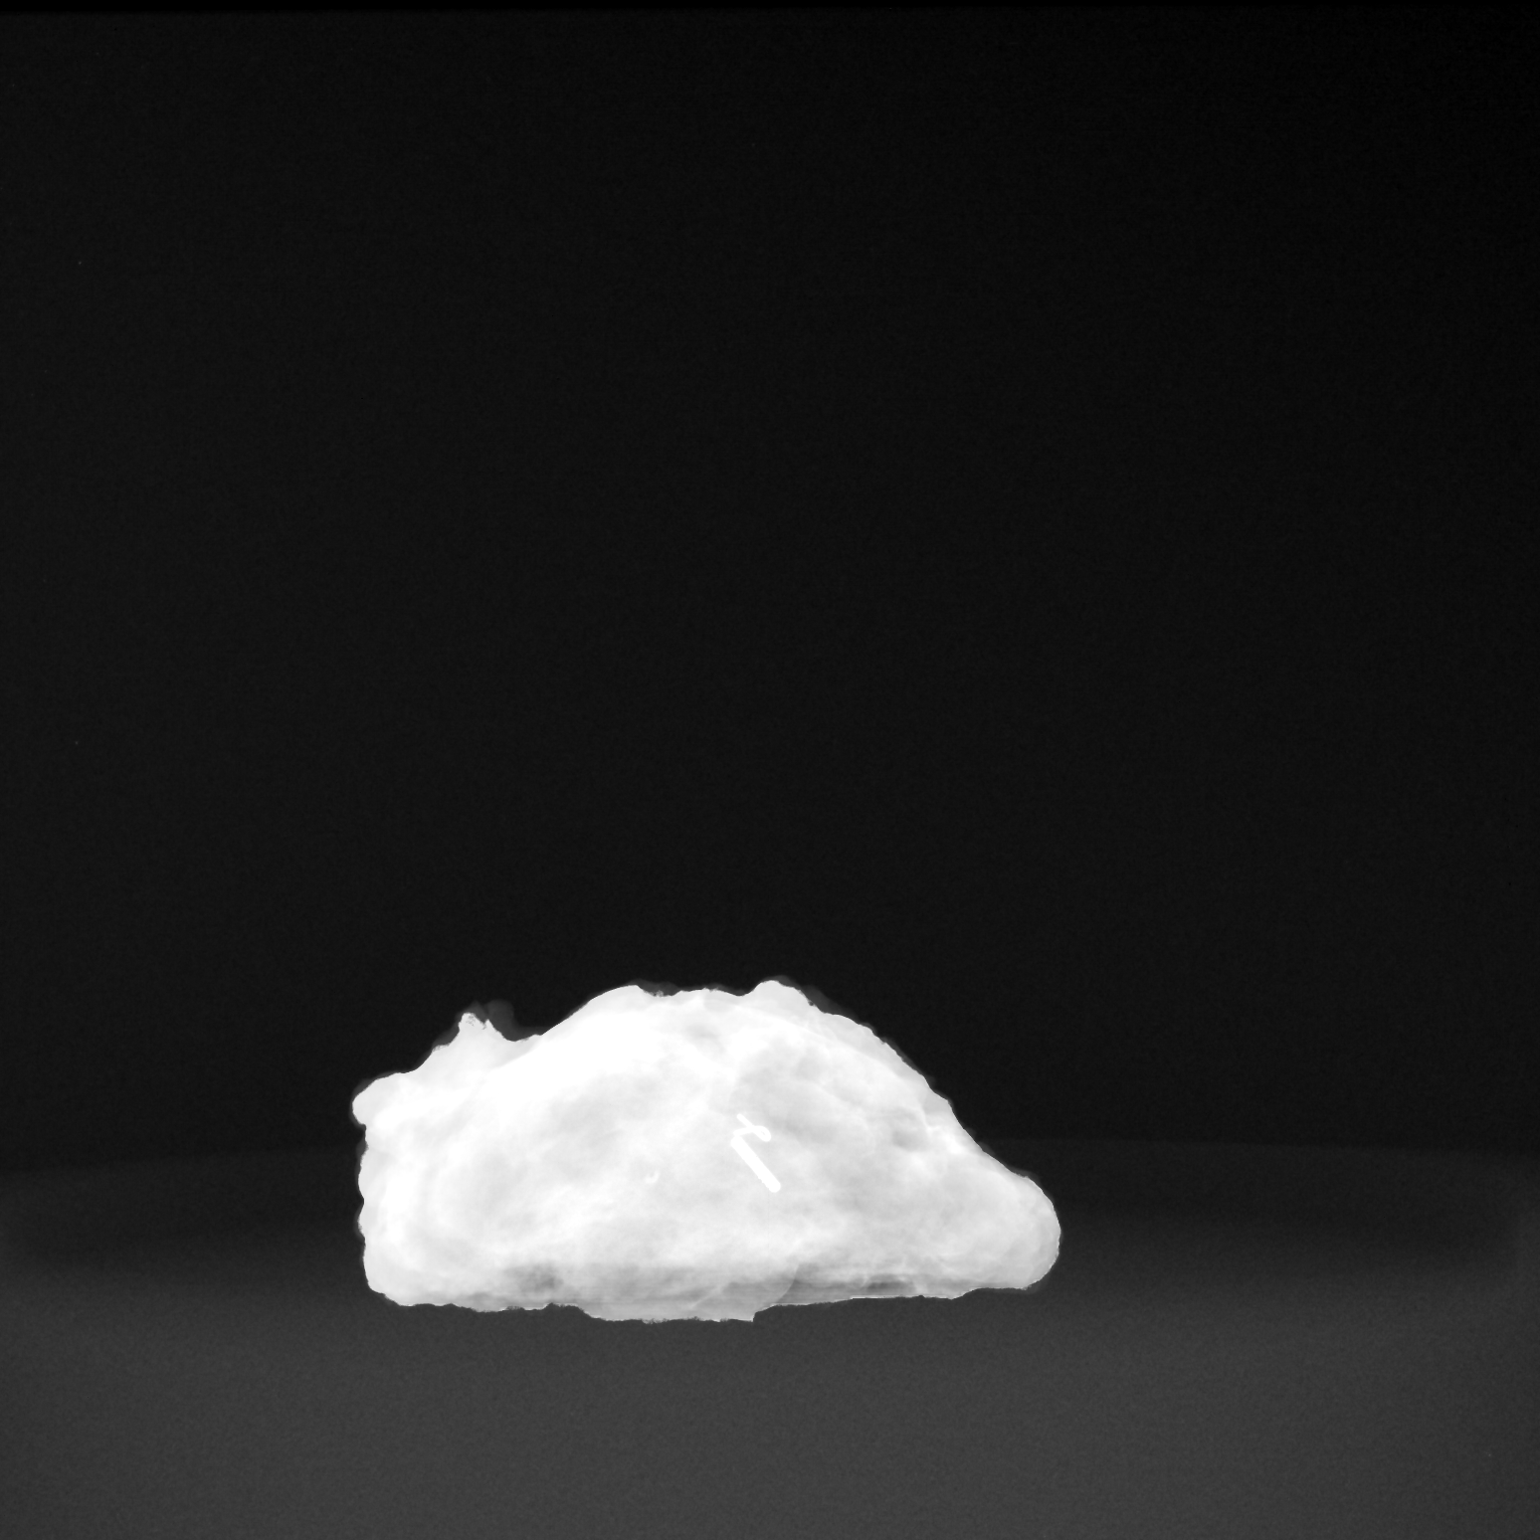

[2 of 2 positions shown; findings below may reference images not displayed]

FINDINGS: Status post excision of the right breast. The radioactive seed and
ribbon shaped biopsy marker clip are present and appear completely
intact within the specimen. Findings discussed with the OR staff
during the procedure.
IMPRESSION: Specimen radiograph of the right breast which contains the ribbon
shaped clip and radioactive seed.

## 2021-04-09 SURGERY — BREAST LUMPECTOMY WITH RADIOACTIVE SEED LOCALIZATION
Anesthesia: Regional | Site: Breast | Laterality: Right

## 2021-04-09 MED ORDER — CHLORHEXIDINE GLUCONATE CLOTH 2 % EX PADS: 6.0000 | MEDICATED_PAD | Freq: Once | CUTANEOUS | Status: DC

## 2021-04-09 MED ORDER — BUPIVACAINE HCL (PF) 0.5 % IJ SOLN
INTRAMUSCULAR | Status: DC | PRN
  Administered 2021-04-09: 20 mL

## 2021-04-09 MED ORDER — CEFAZOLIN SODIUM-DEXTROSE 2-4 GM/100ML-% IV SOLN
2.0000 g | INTRAVENOUS | Status: AC
  Administered 2021-04-09: 2 g via INTRAVENOUS

## 2021-04-09 MED ORDER — DEXAMETHASONE SODIUM PHOSPHATE 10 MG/ML IJ SOLN
INTRAMUSCULAR | Status: AC
  Filled 2021-04-09: qty 1

## 2021-04-09 MED ORDER — LIDOCAINE 2% (20 MG/ML) 5 ML SYRINGE
INTRAMUSCULAR | Status: DC | PRN
  Administered 2021-04-09: 60 mg via INTRAVENOUS

## 2021-04-09 MED ORDER — CEFAZOLIN SODIUM-DEXTROSE 2-4 GM/100ML-% IV SOLN
INTRAVENOUS | Status: AC
  Filled 2021-04-09: qty 100

## 2021-04-09 MED ORDER — BUPIVACAINE-EPINEPHRINE 0.5% -1:200000 IJ SOLN
INTRAMUSCULAR | Status: DC | PRN
  Administered 2021-04-09: 13 mL

## 2021-04-09 MED ORDER — OXYCODONE HCL 5 MG PO TABS
5.0000 mg | ORAL_TABLET | Freq: Once | ORAL | Status: AC | PRN
  Administered 2021-04-09: 5 mg via ORAL

## 2021-04-09 MED ORDER — OXYCODONE HCL 5 MG PO TABS
ORAL_TABLET | ORAL | Status: AC
Start: 2021-04-09 — End: ?
  Filled 2021-04-09: qty 1

## 2021-04-09 MED ORDER — FENTANYL CITRATE (PF) 100 MCG/2ML IJ SOLN
INTRAMUSCULAR | Status: AC
  Filled 2021-04-09: qty 2

## 2021-04-09 MED ORDER — FENTANYL CITRATE (PF) 100 MCG/2ML IJ SOLN
INTRAMUSCULAR | Status: DC | PRN
  Administered 2021-04-09: 25 ug via INTRAVENOUS

## 2021-04-09 MED ORDER — MIDAZOLAM HCL 2 MG/2ML IJ SOLN
1.0000 mg | Freq: Once | INTRAMUSCULAR | Status: AC
  Administered 2021-04-09: 1 mg via INTRAVENOUS

## 2021-04-09 MED ORDER — PROPOFOL 10 MG/ML IV BOLUS
INTRAVENOUS | Status: AC
  Filled 2021-04-09: qty 20

## 2021-04-09 MED ORDER — FENTANYL CITRATE (PF) 100 MCG/2ML IJ SOLN: 25.0000 ug | INTRAMUSCULAR | Status: DC | PRN

## 2021-04-09 MED ORDER — LIDOCAINE 2% (20 MG/ML) 5 ML SYRINGE
INTRAMUSCULAR | Status: AC
Start: 2021-04-09 — End: ?
  Filled 2021-04-09: qty 5

## 2021-04-09 MED ORDER — EPHEDRINE 5 MG/ML INJ
INTRAVENOUS | Status: AC
Start: 2021-04-09 — End: ?
  Filled 2021-04-09: qty 5

## 2021-04-09 MED ORDER — ONDANSETRON HCL 4 MG/2ML IJ SOLN
INTRAMUSCULAR | Status: AC
  Filled 2021-04-09: qty 2

## 2021-04-09 MED ORDER — AMISULPRIDE (ANTIEMETIC) 5 MG/2ML IV SOLN: 10.0000 mg | Freq: Once | INTRAVENOUS | Status: DC | PRN

## 2021-04-09 MED ORDER — FENTANYL CITRATE (PF) 100 MCG/2ML IJ SOLN
50.0000 ug | Freq: Once | INTRAMUSCULAR | Status: AC
  Administered 2021-04-09: 50 ug via INTRAVENOUS

## 2021-04-09 MED ORDER — ONDANSETRON HCL 4 MG/2ML IJ SOLN
INTRAMUSCULAR | Status: DC | PRN
Start: 2021-04-09 — End: 2021-04-09
  Administered 2021-04-09: 4 mg via INTRAVENOUS

## 2021-04-09 MED ORDER — MIDAZOLAM HCL 2 MG/2ML IJ SOLN
INTRAMUSCULAR | Status: AC
  Filled 2021-04-09: qty 2

## 2021-04-09 MED ORDER — ACETAMINOPHEN 500 MG PO TABS
1000.0000 mg | ORAL_TABLET | ORAL | Status: AC
  Administered 2021-04-09: 1000 mg via ORAL

## 2021-04-09 MED ORDER — EPHEDRINE SULFATE (PRESSORS) 50 MG/ML IJ SOLN
INTRAMUSCULAR | Status: DC | PRN
Start: 2021-04-09 — End: 2021-04-09
  Administered 2021-04-09 (×2): 5 mg via INTRAVENOUS

## 2021-04-09 MED ORDER — ONDANSETRON HCL 4 MG/2ML IJ SOLN: 4.0000 mg | Freq: Once | INTRAMUSCULAR | Status: DC | PRN

## 2021-04-09 MED ORDER — ENSURE PRE-SURGERY PO LIQD: 296.0000 mL | Freq: Once | ORAL | Status: DC

## 2021-04-09 MED ORDER — ACETAMINOPHEN 500 MG PO TABS
ORAL_TABLET | ORAL | Status: AC
  Filled 2021-04-09: qty 2

## 2021-04-09 MED ORDER — LACTATED RINGERS IV SOLN: INTRAVENOUS | Status: DC

## 2021-04-09 MED ORDER — BUPIVACAINE LIPOSOME 1.3 % IJ SUSP
INTRAMUSCULAR | Status: DC | PRN
  Administered 2021-04-09: 10 mL

## 2021-04-09 MED ORDER — PROPOFOL 10 MG/ML IV BOLUS
INTRAVENOUS | Status: DC | PRN
  Administered 2021-04-09: 150 mg via INTRAVENOUS

## 2021-04-09 MED ORDER — PHENYLEPHRINE HCL (PRESSORS) 10 MG/ML IV SOLN
INTRAVENOUS | Status: DC | PRN
  Administered 2021-04-09: 40 ug via INTRAVENOUS
  Administered 2021-04-09: 80 ug via INTRAVENOUS
  Administered 2021-04-09: 120 ug via INTRAVENOUS
  Administered 2021-04-09 (×2): 40 ug via INTRAVENOUS
  Administered 2021-04-09: 80 ug via INTRAVENOUS

## 2021-04-09 MED ORDER — OXYCODONE HCL 5 MG/5ML PO SOLN: 5.0000 mg | Freq: Once | ORAL | Status: AC | PRN

## 2021-04-09 MED ORDER — PHENYLEPHRINE 40 MCG/ML (10ML) SYRINGE FOR IV PUSH (FOR BLOOD PRESSURE SUPPORT)
PREFILLED_SYRINGE | INTRAVENOUS | Status: AC
  Filled 2021-04-09: qty 10

## 2021-04-09 MED ORDER — DEXAMETHASONE SODIUM PHOSPHATE 4 MG/ML IJ SOLN
INTRAMUSCULAR | Status: DC | PRN
Start: 2021-04-09 — End: 2021-04-09
  Administered 2021-04-09: 8 mg via INTRAVENOUS

## 2021-04-09 MED ORDER — TRAMADOL HCL 50 MG PO TABS: 50.0000 mg | ORAL_TABLET | Freq: Four times a day (QID) | ORAL | 0 refills | Status: DC | PRN

## 2021-04-09 SURGICAL SUPPLY — 48 items
ADH SKN CLS APL DERMABOND .7 (GAUZE/BANDAGES/DRESSINGS) ×1
APL PRP STRL LF DISP 70% ISPRP (MISCELLANEOUS) ×1
APPLIER CLIP 9.375 MED OPEN (MISCELLANEOUS)
APR CLP MED 9.3 20 MLT OPN (MISCELLANEOUS)
BINDER BREAST 3XL (GAUZE/BANDAGES/DRESSINGS) IMPLANT
BINDER BREAST LRG (GAUZE/BANDAGES/DRESSINGS) ×1 IMPLANT
BINDER BREAST MEDIUM (GAUZE/BANDAGES/DRESSINGS) IMPLANT
BINDER BREAST XLRG (GAUZE/BANDAGES/DRESSINGS) IMPLANT
BINDER BREAST XXLRG (GAUZE/BANDAGES/DRESSINGS) IMPLANT
BLADE SURG 15 STRL LF DISP TIS (BLADE) ×1 IMPLANT
BLADE SURG 15 STRL SS (BLADE) ×2
CANISTER SUC SOCK COL 7IN (MISCELLANEOUS) IMPLANT
CANISTER SUCT 1200ML W/VALVE (MISCELLANEOUS) IMPLANT
CHLORAPREP W/TINT 26 (MISCELLANEOUS) ×2 IMPLANT
CLIP APPLIE 9.375 MED OPEN (MISCELLANEOUS) IMPLANT
COVER BACK TABLE 60X90IN (DRAPES) ×2 IMPLANT
COVER MAYO STAND STRL (DRAPES) ×2 IMPLANT
COVER PROBE W GEL 5X96 (DRAPES) ×3 IMPLANT
DERMABOND ADVANCED (GAUZE/BANDAGES/DRESSINGS) ×1
DERMABOND ADVANCED .7 DNX12 (GAUZE/BANDAGES/DRESSINGS) ×1 IMPLANT
DRAPE LAPAROSCOPIC ABDOMINAL (DRAPES) ×2 IMPLANT
DRAPE UTILITY XL STRL (DRAPES) ×2 IMPLANT
ELECT REM PT RETURN 9FT ADLT (ELECTROSURGICAL) ×2
ELECTRODE REM PT RTRN 9FT ADLT (ELECTROSURGICAL) ×1 IMPLANT
GAUZE SPONGE 4X4 12PLY STRL LF (GAUZE/BANDAGES/DRESSINGS) IMPLANT
GLOVE SURG SIGNA 7.5 PF LTX (GLOVE) ×2 IMPLANT
GOWN STRL REUS W/ TWL LRG LVL3 (GOWN DISPOSABLE) ×1 IMPLANT
GOWN STRL REUS W/ TWL XL LVL3 (GOWN DISPOSABLE) ×1 IMPLANT
GOWN STRL REUS W/TWL LRG LVL3 (GOWN DISPOSABLE) ×2
GOWN STRL REUS W/TWL XL LVL3 (GOWN DISPOSABLE) ×2
KIT MARKER MARGIN INK (KITS) ×2 IMPLANT
NDL HYPO 25X1 1.5 SAFETY (NEEDLE) ×1 IMPLANT
NEEDLE HYPO 25X1 1.5 SAFETY (NEEDLE) ×2 IMPLANT
NS IRRIG 1000ML POUR BTL (IV SOLUTION) IMPLANT
PACK BASIN DAY SURGERY FS (CUSTOM PROCEDURE TRAY) ×2 IMPLANT
PENCIL SMOKE EVACUATOR (MISCELLANEOUS) ×2 IMPLANT
SLEEVE SCD COMPRESS KNEE MED (STOCKING) ×2 IMPLANT
SPIKE FLUID TRANSFER (MISCELLANEOUS) IMPLANT
SPONGE T-LAP 4X18 ~~LOC~~+RFID (SPONGE) ×2 IMPLANT
SUT MNCRL AB 4-0 PS2 18 (SUTURE) ×2 IMPLANT
SUT SILK 2 0 SH (SUTURE) IMPLANT
SUT VIC AB 3-0 SH 27 (SUTURE) ×2
SUT VIC AB 3-0 SH 27X BRD (SUTURE) ×1 IMPLANT
SYR CONTROL 10ML LL (SYRINGE) ×2 IMPLANT
TOWEL GREEN STERILE FF (TOWEL DISPOSABLE) ×2 IMPLANT
TRAY FAXITRON CT DISP (TRAY / TRAY PROCEDURE) ×2 IMPLANT
TUBE CONNECTING 20X1/4 (TUBING) IMPLANT
YANKAUER SUCT BULB TIP NO VENT (SUCTIONS) IMPLANT

## 2021-04-09 NOTE — Anesthesia Postprocedure Evaluation (Signed)
Anesthesia Post Note  Patient: Lisa Chapman  Procedure(s) Performed: RIGHT BREAST LUMPECTOMY WITH RADIOACTIVE SEED LOCALIZATION (Right: Breast) RADIOACTIVE SEED GUIDED RIGHT AXILLARY SENTINEL LYMPH NODE DISSECTION (Right: Breast)     Patient location during evaluation: PACU Anesthesia Type: Regional and General Level of consciousness: awake Pain management: pain level controlled Vital Signs Assessment: post-procedure vital signs reviewed and stable Respiratory status: spontaneous breathing and respiratory function stable Cardiovascular status: stable Postop Assessment: no apparent nausea or vomiting Anesthetic complications: no   No notable events documented.  Last Vitals:  Vitals:   04/09/21 1445 04/09/21 1500  BP: 130/66 (!) 122/58  Pulse: 70 68  Resp: 19 14  Temp:    SpO2: 98% 96%    Last Pain:  Vitals:   04/09/21 1500  TempSrc:   PainSc: 4                  Candra R Yecenia Dalgleish

## 2021-04-09 NOTE — Progress Notes (Signed)
Assisted Dr. Elgie Congo with right, ultrasound guided, pectoralis block. Side rails up, monitors on throughout procedure. See vital signs in flow sheet. Tolerated Procedure well.

## 2021-04-09 NOTE — Op Note (Signed)
Lisa Chapman 04/09/2021   Pre-op Diagnosis: RIGHT BREAST CANCER     Post-op Diagnosis: same  Procedure(s): RIGHT BREAST LUMPECTOMY WITH RADIOACTIVE SEED LOCALIZATION RADIOACTIVE SEED GUIDED RIGHT AXILLARY SENTINEL LYMPH NODE TARGETED DISSECTION  Surgeon(s): Coralie Keens, MD  Anesthesia: General  Staff:  Circulator: Donnita Falls, RN Scrub Person: Lorenza Burton, CST  Estimated Blood Loss: Minimal               Specimens: SENT TO PATH  Indications: This is a 76 year old female who presented with right breast cancer.  She underwent neoadjuvant therapy secondary to the biopsy showing invasive ductal carcinoma which was at least 4 cm in size.  She also had a lymph node biopsy showing metastatic cancer.  Again on her pretreatment MRI the tumor size was greater than 4 cm and there were at least 9 enlarged lymph nodes.  After neoadjuvant therapy she had complete resolution of the mass in the breast and resolution of the lymphadenopathy.  The decision was made to proceed with a radioactive seed guided right breast lumpectomy and targeted lymph node dissection.  Procedure: The patient was brought to the operating room identifies correct patient.  She is placed upon the operating table general anesthesia was induced.  Her right breast and axilla were then prepped and draped in usual sterile fashion.  I localized the radioactive seed in the breast but the neoprobe at the 9 o'clock position of the breast.  I made incision around the lateral edge of the areola with a scalpel after anesthetizing with Marcaine.  I then dissected down to the breast tissue with the electrocautery.  I then dissected laterally toward the radioactive seed with aid of the neoprobe.  I then performed a wide lumpectomy staying widely around the radioactive seed going elevated on the chest wall.  I then completed a lumpectomy staying underneath the seed.  Again this was with the aid of the neoprobe.  Once the specimen  was removed I marked all margins with paint.  An x-ray was performed confirmed the radioactive seed was in the center of the tissue specimen.  The specimen was then sent to pathology for evaluation.  Hemostasis was achieved with the cautery.  I placed surgical clips around the periphery of the lumpectomy cavity.  Next identified the radioactive seed in the right axilla with the neoprobe.  I anesthetized skin with Marcaine made incision with a scalpel.  I then dissected down to the deep axillary tissue.  The patient had multiple enlarged lymph nodes.  I dissected down to the radioactive seed with aid of neoprobe.  This was right along the chest wall.  I removed all the tissue in this area including the palpable lymph nodes.  An x-ray was performed on specimen confirming multiple nodes and the radioactive seed but no marker for the targeted node.  I removed at least 2 more nodal packages of tissue which were palpable and could still not see the previous marker.  I further palpated the area and found no other adenopathy and basically remove most the tissue in the area but could not find the original hydro marker.  At this point after removing multiple lymph nodes the decision was made to not remove any further tissue for risk of lymphedema and seroma.  Hemostasis was achieved with the cautery.  I closed subtenons tissue with interrupted 3-0 Vicryl sutures and closed skin with running 4-0 Monocryl.  I closed the breast incision with a running 4-0 Monocryl as  well.  Dermabond was then applied.  The patient was placed in a breast binder.  The patient tolerated the procedure well.  All the counts were correct at the end of the procedure.  The patient was then extubated in the operating room and taken in stable condition to the recovery room.          Coralie Keens   Date: 04/09/2021  Time: 2:23 PM

## 2021-04-09 NOTE — Anesthesia Procedure Notes (Signed)
Procedure Name: LMA Insertion Date/Time: 04/09/2021 1:17 PM Performed by: Ezequiel Kayser, CRNA Pre-anesthesia Checklist: Patient identified, Emergency Drugs available, Suction available and Patient being monitored Patient Re-evaluated:Patient Re-evaluated prior to induction Oxygen Delivery Method: Circle System Utilized Preoxygenation: Pre-oxygenation with 100% oxygen Induction Type: IV induction Ventilation: Mask ventilation without difficulty LMA: LMA inserted LMA Size: 3.0 Number of attempts: 1 Airway Equipment and Method: Bite block Placement Confirmation: positive ETCO2 Tube secured with: Tape Dental Injury: Teeth and Oropharynx as per pre-operative assessment

## 2021-04-09 NOTE — Transfer of Care (Signed)
Immediate Anesthesia Transfer of Care Note  Patient: ALIEAH BRINTON  Procedure(s) Performed: RIGHT BREAST LUMPECTOMY WITH RADIOACTIVE SEED LOCALIZATION (Right: Breast) RADIOACTIVE SEED GUIDED RIGHT AXILLARY SENTINEL LYMPH NODE DISSECTION (Right: Breast)  Patient Location: PACU  Anesthesia Type:GA combined with regional for post-op pain  Level of Consciousness: drowsy  Airway & Oxygen Therapy: Patient Spontanous Breathing and Patient connected to face mask oxygen  Post-op Assessment: Report given to RN and Post -op Vital signs reviewed and stable  Post vital signs: Reviewed and stable  Last Vitals:  Vitals Value Taken Time  BP 148/78 04/09/21 1430  Temp    Pulse 78 04/09/21 1431  Resp 18 04/09/21 1431  SpO2 97 % 04/09/21 1431  Vitals shown include unvalidated device data.  Last Pain:  Vitals:   04/09/21 1123  TempSrc: Oral  PainSc: 0-No pain      Patients Stated Pain Goal: 4 (49/82/64 1583)  Complications: No notable events documented.

## 2021-04-09 NOTE — Discharge Instructions (Addendum)
[facility] Office Phone Number [phone]  BREAST BIOPSY/ PARTIAL MASTECTOMY: POST OP INSTRUCTIONS  Always review your discharge instruction sheet given to you by the facility where your surgery was performed.  IF YOU HAVE DISABILITY OR FAMILY LEAVE FORMS, YOU MUST BRING THEM TO THE OFFICE FOR PROCESSING.  DO NOT GIVE THEM TO YOUR DOCTOR.  A prescription for pain medication may be given to you upon discharge.  Take your pain medication as prescribed, if needed.  If narcotic pain medicine is not needed, then you may take acetaminophen (Tylenol) or ibuprofen (Advil) as needed. Take your usually prescribed medications unless otherwise directed If you need a refill on your pain medication, please contact your pharmacy.  They will contact our office to request authorization.  Prescriptions will not be filled after 5pm or on week-ends. You should eat very light the first 24 hours after surgery, such as soup, crackers, pudding, etc.  Resume your normal diet the day after surgery. Most patients will experience some swelling and bruising in the breast.  Ice packs and a good support bra will help.  Swelling and bruising can take several days to resolve.  It is common to experience some constipation if taking pain medication after surgery.  Increasing fluid intake and taking a stool softener will usually help or prevent this problem from occurring.  A mild laxative (Milk of Magnesia or Miralax) should be taken according to package directions if there are no bowel movements after 48 hours. Unless discharge instructions indicate otherwise, you may remove your bandages 24-48 hours after surgery, and you may shower at that time.  You may have steri-strips (small skin tapes) in place directly over the incision.  These strips should be left on the skin for 7-10 days.  If your surgeon used skin glue on the incision, you may shower in 24 hours.  The glue will flake off over the next 2-3 weeks.  Any  sutures or staples will be removed at the office during your follow-up visit. ACTIVITIES:  You may resume regular daily activities (gradually increasing) beginning the next day.  Wearing a good support bra or sports bra minimizes pain and swelling.  You may have sexual intercourse when it is comfortable. You may drive when you no longer are taking prescription pain medication, you can comfortably wear a seatbelt, and you can safely maneuver your car and apply brakes. RETURN TO WORK:  ______________________________________________________________________________________ Dennis Bast should see your doctor in the office for a follow-up appointment approximately two weeks after your surgery.  Your doctors nurse will typically make your follow-up appointment when she calls you with your pathology report.  Expect your pathology report 2-3 business days after your surgery.  You may call to check if you do not hear from [place] after three days. OTHER INSTRUCTIONS: OK TO REMOVE THE BINDER AND SHOWER STARTING TOMORROW ICE PACK, TYLENOL, AND IBUPROFEN ALSO FOR PAIN NO VIGOROUS ACTIVITY FOR ONE WEEK _______________________________________________________________________________________________ _____________________________________________________________________________________________________________________________________ _____________________________________________________________________________________________________________________________________ _____________________________________________________________________________________________________________________________________  WHEN TO CALL YOUR DOCTOR: Fever over 101.0 Nausea and/or vomiting. Extreme swelling or bruising. Continued bleeding from incision. Increased pain, redness, or drainage from the incision.  The clinic staff is available to answer your questions during regular business hours.  Please dont hesitate to call and ask to speak to one of the  nurses for clinical concerns.  If you have a medical emergency, go to the nearest emergency room or call 911.  A surgeon from [facility] is always on call at the hospital. For further  questions, please visit [email or web address]     No Tylenol until after 5:30pm today if needed You had 5 mg of Oxycodone at 3:19 Pm.   Post Anesthesia Home Care Instructions  Activity: Get plenty of rest for the remainder of the day. A responsible individual must stay with you for 24 hours following the procedure.  For the next 24 hours, DO NOT: -Drive a car -Paediatric nurse -Drink alcoholic beverages -Take any medication unless instructed by your physician -Make any legal decisions or sign important papers.  Meals: Start with liquid foods such as gelatin or soup. Progress to regular foods as tolerated. Avoid greasy, spicy, heavy foods. If nausea and/or vomiting occur, drink only clear liquids until the nausea and/or vomiting subsides. Call your physician if vomiting continues.  Special Instructions/Symptoms: Your throat may feel dry or sore from the anesthesia or the breathing tube placed in your throat during surgery. If this causes discomfort, gargle with warm salt water. The discomfort should disappear within 24 hours.  If you had a scopolamine patch placed behind your ear for the management of post- operative nausea and/or vomiting:  1. The medication in the patch is effective for 72 hours, after which it should be removed.  Wrap patch in a tissue and discard in the trash. Wash hands thoroughly with soap and water. 2. You may remove the patch earlier than 72 hours if you experience unpleasant side effects which may include dry mouth, dizziness or visual disturbances. 3. Avoid touching the patch. Wash your hands with soap and water after contact with the patch.     Information for Discharge Teaching: EXPAREL (bupivacaine liposome injectable suspension)   Your surgeon or  anesthesiologist gave you EXPAREL(bupivacaine) to help control your pain after surgery.  EXPAREL is a local anesthetic that provides pain relief by numbing the tissue around the surgical site. EXPAREL is designed to release pain medication over time and can control pain for up to 72 hours. Depending on how you respond to EXPAREL, you may require less pain medication during your recovery.  Possible side effects: Temporary loss of sensation or ability to move in the area where bupivacaine was injected. Nausea, vomiting, constipation Rarely, numbness and tingling in your mouth or lips, lightheadedness, or anxiety may occur. Call your doctor right away if you think you may be experiencing any of these sensations, or if you have other questions regarding possible side effects.  Follow all other discharge instructions given to you by your surgeon or nurse. Eat a healthy diet and drink plenty of water or other fluids.  If you return to the hospital for any reason within 96 hours following the administration of EXPAREL, it is important for health care providers to know that you have received this anesthetic. A teal colored band has been placed on your arm with the date, time and amount of EXPAREL you have received in order to alert and inform your health care providers. Please leave this armband in place for the full 96 hours following administration, and then you may remove the band.

## 2021-04-09 NOTE — Anesthesia Preprocedure Evaluation (Addendum)
Anesthesia Evaluation  Patient identified by MRN, date of birth, ID band Patient awake    Reviewed: Allergy & Precautions, NPO status , Patient's Chart, lab work & pertinent test results  Airway Mallampati: II  TM Distance: >3 FB Neck ROM: Full    Dental  (+) Partial Upper   Pulmonary neg pulmonary ROS,    Pulmonary exam normal breath sounds clear to auscultation       Cardiovascular Normal cardiovascular exam+ dysrhythmias Supra Ventricular Tachycardia  Rhythm:Regular Rate:Normal  H/o DVT On xarelto   Neuro/Psych negative neurological ROS  negative psych ROS   GI/Hepatic negative GI ROS, Neg liver ROS,   Endo/Other  negative endocrine ROS  Renal/GU negative Renal ROS  negative genitourinary   Musculoskeletal negative musculoskeletal ROS (+)   Abdominal   Peds negative pediatric ROS (+)  Hematology negative hematology ROS (+)   Anesthesia Other Findings Breast ca  Reproductive/Obstetrics negative OB ROS                            Anesthesia Physical Anesthesia Plan  ASA: 3  Anesthesia Plan: General and Regional   Post-op Pain Management: Tylenol PO (pre-op)* and Regional block*   Induction: Intravenous  PONV Risk Score and Plan: 3 and Midazolam, Treatment may vary due to age or medical condition, Ondansetron and Dexamethasone  Airway Management Planned: LMA  Additional Equipment:   Intra-op Plan:   Post-operative Plan: Extubation in OR  Informed Consent: I have reviewed the patients History and Physical, chart, labs and discussed the procedure including the risks, benefits and alternatives for the proposed anesthesia with the patient or authorized representative who has indicated his/her understanding and acceptance.     Dental advisory given  Plan Discussed with: CRNA, Anesthesiologist and Surgeon  Anesthesia Plan Comments: (PEC block with exparel. GA/LMA. Norton Blizzard,  MD  )        Anesthesia Quick Evaluation

## 2021-04-09 NOTE — Anesthesia Procedure Notes (Signed)
Anesthesia Regional Block: Pectoralis block   Pre-Anesthetic Checklist: , timeout performed,  Correct Patient, Correct Site, Correct Laterality,  Correct Procedure, Correct Position, site marked,  Risks and benefits discussed,  Surgical consent,  Pre-op evaluation,  At surgeon's request and post-op pain management  Laterality: Right  Prep: chloraprep       Needles:  Injection technique: Single-shot  Needle Type: Echogenic Stimulator Needle     Needle Length: 10cm  Needle Gauge: 20     Additional Needles:   Procedures:,,,, ultrasound used (permanent image in chart),,    Narrative:  Start time: 04/09/2021 11:45 AM End time: 04/09/2021 11:50 AM Injection made incrementally with aspirations every 5 mL.  Performed by: Personally  Anesthesiologist: Merlinda Frederick, MD  Additional Notes: Functioning IV was confirmed and monitors were applied.  Sterile prep and drape,hand hygiene and sterile gloves were used. Ultrasound guidance: relevant anatomy identified, needle position confirmed, local anesthetic spread visualized around nerve(s)., vascular puncture avoided. Negative aspiration and negative test dose prior to incremental administration of local anesthetic. The patient tolerated the procedure well.

## 2021-04-09 NOTE — Interval H&P Note (Signed)
History and Physical Interval Note:no change in H and P  04/09/2021 11:56 AM  Lisa Chapman  has presented today for surgery, with the diagnosis of RIGHT BREAST CANCER.  The various methods of treatment have been discussed with the patient and family. After consideration of risks, benefits and other options for treatment, the patient has consented to  Procedure(s): RIGHT BREAST LUMPECTOMY WITH RADIOACTIVE SEED LOCALIZATION (Right) RADIOACTIVE SEED GUIDED RIGHT AXILLARY SENTINEL LYMPH NODE DISSECTION (Right) as a surgical intervention.  The patient's history has been reviewed, patient examined, no change in status, stable for surgery.  I have reviewed the patient's chart and labs.  Questions were answered to the patient's satisfaction.     Coralie Keens

## 2021-04-10 ENCOUNTER — Encounter (HOSPITAL_BASED_OUTPATIENT_CLINIC_OR_DEPARTMENT_OTHER): Payer: Self-pay | Admitting: Surgery

## 2021-04-12 ENCOUNTER — Encounter: Payer: Self-pay | Admitting: Adult Health

## 2021-04-12 ENCOUNTER — Inpatient Hospital Stay (HOSPITAL_BASED_OUTPATIENT_CLINIC_OR_DEPARTMENT_OTHER): Payer: Medicare HMO | Admitting: Adult Health

## 2021-04-12 ENCOUNTER — Encounter: Payer: Self-pay | Admitting: *Deleted

## 2021-04-12 ENCOUNTER — Other Ambulatory Visit: Payer: Self-pay

## 2021-04-12 ENCOUNTER — Ambulatory Visit: Payer: Medicare HMO | Admitting: Hematology and Oncology

## 2021-04-12 ENCOUNTER — Encounter: Payer: Self-pay | Admitting: Hematology and Oncology

## 2021-04-12 ENCOUNTER — Inpatient Hospital Stay: Payer: Medicare HMO

## 2021-04-12 ENCOUNTER — Inpatient Hospital Stay: Payer: Medicare HMO | Attending: Hematology and Oncology

## 2021-04-12 VITALS — BP 141/81 | HR 91 | Temp 97.5°F | Resp 16 | Ht 60.0 in | Wt 141.3 lb

## 2021-04-12 DIAGNOSIS — Z452 Encounter for adjustment and management of vascular access device: Secondary | ICD-10-CM | POA: Diagnosis not present

## 2021-04-12 DIAGNOSIS — C50411 Malignant neoplasm of upper-outer quadrant of right female breast: Secondary | ICD-10-CM | POA: Diagnosis not present

## 2021-04-12 DIAGNOSIS — Z95828 Presence of other vascular implants and grafts: Secondary | ICD-10-CM

## 2021-04-12 DIAGNOSIS — Z170 Estrogen receptor positive status [ER+]: Secondary | ICD-10-CM

## 2021-04-12 DIAGNOSIS — C773 Secondary and unspecified malignant neoplasm of axilla and upper limb lymph nodes: Secondary | ICD-10-CM | POA: Diagnosis not present

## 2021-04-12 DIAGNOSIS — Z5112 Encounter for antineoplastic immunotherapy: Secondary | ICD-10-CM | POA: Insufficient documentation

## 2021-04-12 LAB — CMP (CANCER CENTER ONLY)
ALT: 8 U/L (ref 0–44)
AST: 12 U/L — ABNORMAL LOW (ref 15–41)
Albumin: 3.9 g/dL (ref 3.5–5.0)
Alkaline Phosphatase: 75 U/L (ref 38–126)
Anion gap: 6 (ref 5–15)
BUN: 11 mg/dL (ref 8–23)
CO2: 27 mmol/L (ref 22–32)
Calcium: 9.4 mg/dL (ref 8.9–10.3)
Chloride: 106 mmol/L (ref 98–111)
Creatinine: 0.68 mg/dL (ref 0.44–1.00)
GFR, Estimated: >60 mL/min (ref >60)
Glucose, Bld: 99 mg/dL (ref 70–99)
Potassium: 3.8 mmol/L (ref 3.5–5.1)
Sodium: 139 mmol/L (ref 135–145)
Total Bilirubin: 0.4 mg/dL (ref 0.3–1.2)
Total Protein: 7.4 g/dL (ref 6.5–8.1)

## 2021-04-12 LAB — CBC WITH DIFFERENTIAL (CANCER CENTER ONLY)
Abs Immature Granulocytes: 0.02 10*3/uL (ref 0.00–0.07)
Basophils Absolute: 0 10*3/uL (ref 0.0–0.1)
Basophils Relative: 0 %
Eosinophils Absolute: 0.7 10*3/uL — ABNORMAL HIGH (ref 0.0–0.5)
Eosinophils Relative: 8 %
HCT: 34.4 % — ABNORMAL LOW (ref 36.0–46.0)
Hemoglobin: 10.8 g/dL — ABNORMAL LOW (ref 12.0–15.0)
Immature Granulocytes: 0 %
Lymphocytes Relative: 22 %
Lymphs Abs: 1.8 10*3/uL (ref 0.7–4.0)
MCH: 28.2 pg (ref 26.0–34.0)
MCHC: 31.4 g/dL (ref 30.0–36.0)
MCV: 89.8 fL (ref 80.0–100.0)
Monocytes Absolute: 0.8 10*3/uL (ref 0.1–1.0)
Monocytes Relative: 10 %
Neutro Abs: 5 10*3/uL (ref 1.7–7.7)
Neutrophils Relative %: 60 %
Platelet Count: 246 10*3/uL (ref 150–400)
RBC: 3.83 MIL/uL — ABNORMAL LOW (ref 3.87–5.11)
RDW: 15.9 % — ABNORMAL HIGH (ref 11.5–15.5)
WBC Count: 8.4 10*3/uL (ref 4.0–10.5)
nRBC: 0 % (ref 0.0–0.2)

## 2021-04-12 LAB — SURGICAL PATHOLOGY

## 2021-04-12 MED ORDER — DIPHENHYDRAMINE HCL 25 MG PO CAPS
25.0000 mg | ORAL_CAPSULE | Freq: Once | ORAL | Status: AC
  Administered 2021-04-12: 25 mg via ORAL
  Filled 2021-04-12: qty 1

## 2021-04-12 MED ORDER — SODIUM CHLORIDE 0.9% FLUSH
10.0000 mL | INTRAVENOUS | Status: DC | PRN
  Administered 2021-04-12: 10 mL

## 2021-04-12 MED ORDER — SODIUM CHLORIDE 0.9 % IV SOLN
420.0000 mg | Freq: Once | INTRAVENOUS | Status: AC
  Administered 2021-04-12: 420 mg via INTRAVENOUS
  Filled 2021-04-12: qty 14

## 2021-04-12 MED ORDER — HEPARIN SOD (PORK) LOCK FLUSH 100 UNIT/ML IV SOLN
500.0000 [IU] | Freq: Once | INTRAVENOUS | Status: AC | PRN
  Administered 2021-04-12: 500 [IU]

## 2021-04-12 MED ORDER — SODIUM CHLORIDE 0.9% FLUSH
10.0000 mL | Freq: Once | INTRAVENOUS | Status: AC
  Administered 2021-04-12: 10 mL

## 2021-04-12 MED ORDER — TRASTUZUMAB-ANNS CHEMO 150 MG IV SOLR
6.0000 mg/kg | Freq: Once | INTRAVENOUS | Status: AC
  Administered 2021-04-12: 420 mg via INTRAVENOUS
  Filled 2021-04-12: qty 20

## 2021-04-12 MED ORDER — ACETAMINOPHEN 325 MG PO TABS
650.0000 mg | ORAL_TABLET | Freq: Once | ORAL | Status: AC
  Administered 2021-04-12: 650 mg via ORAL
  Filled 2021-04-12: qty 2

## 2021-04-12 MED ORDER — SODIUM CHLORIDE 0.9 % IV SOLN: Freq: Once | INTRAVENOUS | Status: AC

## 2021-04-12 NOTE — Patient Instructions (Signed)
[facility]  Discharge Instructions: ?Thank you for choosing [facility] to provide your oncology and hematology care.  ? ?If you have a lab appointment with the [facility], please go directly to the [facility] and check in at the registration area. ?  ?Wear comfortable clothing and clothing appropriate for easy access to any Portacath or PICC line.  ? ?We strive to give you quality time with your provider. You may need to reschedule your appointment if you arrive late (15 or more minutes).  Arriving late affects you and other patients whose appointments are after yours.  Also, if you miss three or more appointments without notifying the office, you may be dismissed from the clinic at the provider?s discretion.    ?  ?For prescription refill requests, have your pharmacy contact our office and allow 72 hours for refills to be completed.   ? ?Today you received the following chemotherapy and/or immunotherapy agents : Trastuzumab and Pertuzumab    ?  ?To help prevent nausea and vomiting after your treatment, we encourage you to take your nausea medication as directed. ? ?BELOW ARE SYMPTOMS THAT SHOULD BE REPORTED IMMEDIATELY: ?*FEVER GREATER THAN 100.4 F (38 ?C) OR HIGHER ?*CHILLS OR SWEATING ?*NAUSEA AND VOMITING THAT IS NOT CONTROLLED WITH YOUR NAUSEA MEDICATION ?*UNUSUAL SHORTNESS OF BREATH ?*UNUSUAL BRUISING OR BLEEDING ?*URINARY PROBLEMS (pain or burning when urinating, or frequent urination) ?*BOWEL PROBLEMS (unusual diarrhea, constipation, pain near the anus) ?TENDERNESS IN MOUTH AND THROAT WITH OR WITHOUT PRESENCE OF ULCERS (sore throat, sores in mouth, or a toothache) ?UNUSUAL RASH, SWELLING OR PAIN  ?UNUSUAL VAGINAL DISCHARGE OR ITCHING  ? ?Items with * indicate a potential emergency and should be followed up as soon as possible or go to the Emergency Department if any problems should occur. ? ?Please show the CHEMOTHERAPY ALERT CARD or IMMUNOTHERAPY ALERT  CARD at check-in to the Emergency Department and triage nurse. ? ?Should you have questions after your visit or need to cancel or reschedule your appointment, please contact [facility]  Dept: [phone]  and follow the prompts.  Office hours are 8:00 a.m. to 4:30 p.m. Monday - Friday. Please note that voicemails left after 4:00 p.m. may not be returned until the following business day.  We are closed weekends and major holidays. You have access to a nurse at all times for urgent questions. Please call the main number to the clinic Dept: [phone] and follow the prompts. ? ? ?For any non-urgent questions, you may also contact your provider using MyChart. We now offer e-Visits for anyone 31 and older to request care online for non-urgent symptoms. For details visit [email or web address]. ?  ?Also download the MyChart app! Go to the app store, search "MyChart", open the app, select [facility], and log in with your MyChart username and password. ? ?Due to Covid, a mask is required upon entering the hospital/clinic. If you do not have a mask, one will be given to you upon arrival. For doctor visits, patients may have 1 support person aged 29 or older with them. For treatment visits, patients cannot have anyone with them due to current Covid guidelines and our immunocompromised population.  ? ?

## 2021-04-12 NOTE — Progress Notes (Signed)
[facility] Cancer Follow up:    Lisa Specking, MD [address] [place] [address]   DIAGNOSIS:  Cancer Staging  Malignant neoplasm of upper-outer quadrant of right breast in female, estrogen receptor positive (HCC) Staging form: Breast, AJCC 8th Edition - Clinical stage from 10/31/2020: Stage IIA (cT2, cN1, cM0, G2, ER+, PR-, HER2+) - Signed by Serena Croissant, MD on 10/31/2020 Stage prefix: Initial diagnosis Histologic grading system: 3 grade system   SUMMARY OF ONCOLOGIC HISTORY: Oncology History  Malignant neoplasm of upper-outer quadrant of right breast in female, estrogen receptor positive (HCC)  10/04/2020 Initial Diagnosis   Palpable lump in the right breast, mammogram detected 3.6 cm mass with several axillary lymph nodes, biopsy revealed grade 1-2 IDC, lymph nodes positive for cancer, ER 40%, PR 0%, Ki-67 15%, HER2 positive 3+ by [facility]   10/31/2020 Cancer Staging   Staging form: Breast, AJCC 8th Edition - Clinical stage from 10/31/2020: Stage IIA (cT2, cN1, cM0, G2, ER+, PR-, HER2+) - Signed by Serena Croissant, MD on 10/31/2020 Stage prefix: Initial diagnosis Histologic grading system: 3 grade system    11/17/2020 -  Chemotherapy   Patient is on Treatment Plan : BREAST  Docetaxel + Carboplatin + Trastuzumab + Pertuzumab  (TCHP) q21d        CURRENT THERAPY: Herceptin Perjeta  INTERVAL HISTORY: Lisa Chapman 76 y.o. female returns for evaluation after undergoing right breast lumpectomy and axillary node dissection on April 09, 2021.  Her pathology was consistent for no residual carcinoma in the breast.  18 lymph nodes were removed and all were negative for cancer.  Lisa Chapman's most recent echo was consistent with an EF of 60 to 65% on February 12, 2021.  The global longitudinal strain was normal.  She says that her lumpectomy site is healing well.  She is wearing her compression bra.  Other than some mild fatigue she feels like she is recovering from her treatment quite  well.  She has a follow-up with Dr. Magnus Ivan on April 24, 2021.   Patient Active Problem List   Diagnosis Date Noted   Port-A-Cath in place 11/17/2020   Malignant neoplasm of upper-outer quadrant of right breast in female, estrogen receptor positive (HCC) 10/31/2020   Recurrent umbilical hernia 11/25/2019   DVT (deep venous thrombosis) (HCC) 03/22/2019   Paroxysmal supraventricular tachycardia (HCC) 03/08/2014   Cardiac murmur 03/08/2014   Mixed hyperlipidemia 03/08/2014    is allergic to other and boniva [ibandronic acid].  MEDICAL HISTORY: Past Medical History:  Diagnosis Date   Cancer (HCC) 2022   Breast Cancer   Diverticulosis    Dysrhythmia    Heart murmur    History of colonic polyps    History of DVT (deep vein thrombosis)    Right leg, April 2015   History of shingles    Mixed hyperlipidemia     SURGICAL HISTORY: Past Surgical History:  Procedure Laterality Date   BREAST LUMPECTOMY WITH RADIOACTIVE SEED LOCALIZATION Right 04/09/2021   Procedure: RIGHT BREAST LUMPECTOMY WITH RADIOACTIVE SEED LOCALIZATION;  Surgeon: Abigail Miyamoto, MD;  Location: [facility];  Service: General;  Laterality: Right;   HERNIA REPAIR     MENISCUS REPAIR Left 03/2019   PORTACATH PLACEMENT Left 11/03/2020   Procedure: INSERTION PORT-A-CATH;  Surgeon: Abigail Miyamoto, MD;  Location: [facility];  Service: General;  Laterality: Left;   RADIOACTIVE SEED GUIDED AXILLARY SENTINEL LYMPH NODE Right 04/09/2021   Procedure: RADIOACTIVE SEED GUIDED RIGHT AXILLARY SENTINEL LYMPH NODE DISSECTION;  Surgeon: Coralie Keens, MD;  Location: [facility];  Service: General;  Laterality: Right;   TONSILLECTOMY      SOCIAL HISTORY: Social History   Socioeconomic History   Marital status: Married    Spouse name: Not on file   Number of children: Not on file   Years of education: Not on file   Highest education level: Not on file  Occupational History   Not on file   Tobacco Use   Smoking status: Never   Smokeless tobacco: Never  Vaping Use   Vaping Use: Never used  Substance and Sexual Activity   Alcohol use: No    Alcohol/week: 0.0 standard drinks   Drug use: No   Sexual activity: Not on file  Other Topics Concern   Not on file  Social History Narrative   Married. No children   Social Determinants of Radio broadcast assistant Strain: Not on file  Food Insecurity: Not on file  Transportation Needs: Not on file  Physical Activity: Not on file  Stress: Not on file  Social Connections: Not on file  Intimate Partner Violence: Not on file    FAMILY HISTORY: Family History  Problem Relation Age of Onset   Heart attack Mother    COPD Father    Heart attack Father    Emphysema Father     Review of Systems  Constitutional:  Positive for fatigue. Negative for appetite change, chills, fever and unexpected weight change.  HENT:   Negative for hearing loss, lump/mass and trouble swallowing.   Eyes:  Negative for eye problems and icterus.  Respiratory:  Negative for chest tightness, cough and shortness of breath.   Cardiovascular:  Negative for chest pain, leg swelling and palpitations.  Gastrointestinal:  Negative for abdominal distention, abdominal pain, constipation, diarrhea, nausea and vomiting.  Endocrine: Negative for hot flashes.  Genitourinary:  Negative for difficulty urinating.   Musculoskeletal:  Negative for arthralgias.  Skin:  Negative for itching and rash.  Neurological:  Negative for dizziness, extremity weakness, headaches and numbness.  Hematological:  Negative for adenopathy. Does not bruise/bleed easily.  Psychiatric/Behavioral:  Negative for depression. The patient is not nervous/anxious.      PHYSICAL EXAMINATION  ECOG PERFORMANCE STATUS: 1 - Symptomatic but completely ambulatory  Vitals:   04/12/21 0830  BP: (!) 141/81  Pulse: 91  Resp: 16  Temp: (!) 97.5 F (36.4 C)  SpO2: 100%    Physical  Exam Constitutional:      General: She is not in acute distress.    Appearance: Normal appearance. She is not toxic-appearing.  HENT:     Head: Normocephalic and atraumatic.  Eyes:     General: No scleral icterus. Cardiovascular:     Rate and Rhythm: Normal rate and regular rhythm.     Pulses: Normal pulses.     Heart sounds: Normal heart sounds.  Pulmonary:     Effort: Pulmonary effort is normal.     Breath sounds: Normal breath sounds.  Abdominal:     General: Abdomen is flat. Bowel sounds are normal. There is no distension.     Palpations: Abdomen is soft.     Tenderness: There is no abdominal tenderness.  Musculoskeletal:        General: No swelling.     Cervical back: Neck supple.  Lymphadenopathy:     Cervical: No cervical adenopathy.  Skin:    General: Skin is warm and dry.     Findings: No rash.  Neurological:     General: No focal deficit present.     Mental Status: She is alert.  Psychiatric:        Mood and Affect: Mood normal.        Behavior: Behavior normal.    LABORATORY DATA:  CBC    Component Value Date/Time   WBC 8.4 04/12/2021 0817   WBC 6.6 10/27/2020 1023   RBC 3.83 (L) 04/12/2021 0817   HGB 10.8 (L) 04/12/2021 0817   HCT 34.4 (L) 04/12/2021 0817   PLT 246 04/12/2021 0817   MCV 89.8 04/12/2021 0817   MCH 28.2 04/12/2021 0817   MCHC 31.4 04/12/2021 0817   RDW 15.9 (H) 04/12/2021 0817   LYMPHSABS 1.8 04/12/2021 0817   MONOABS 0.8 04/12/2021 0817   EOSABS 0.7 (H) 04/12/2021 0817   BASOSABS 0.0 04/12/2021 0817    CMP     Component Value Date/Time   NA 139 04/12/2021 0817   K 3.8 04/12/2021 0817   CL 106 04/12/2021 0817   CO2 27 04/12/2021 0817   GLUCOSE 99 04/12/2021 0817   BUN 11 04/12/2021 0817   CREATININE 0.68 04/12/2021 0817   CALCIUM 9.4 04/12/2021 0817   PROT 7.4 04/12/2021 0817   ALBUMIN 3.9 04/12/2021 0817   AST 12 (L) 04/12/2021 0817   ALT 8 04/12/2021 0817   ALKPHOS 75 04/12/2021 0817   BILITOT 0.4 04/12/2021 0817    GFRNONAA >60 04/12/2021 0817     ASSESSMENT and THERAPY PLAN:   Malignant neoplasm of upper-outer quadrant of right breast in female, estrogen receptor positive (HCC) Palpable lump in the right breast, mammogram detected 3.6 cm mass with several axillary lymph nodes, biopsy revealed grade 1-2 IDC, lymph nodes positive for cancer, ER 40%, PR 0%, Ki-67 15%, HER2 positive 3+ by IHC   Treatment plan: 1. Neoadjuvant chemotherapy with [facility] Perjeta 6 cycles followed by Herceptin Perjeta maintenance for 1 year 2. Followed by lumpectomy and axillary node dissection completed on April 09, 2021 consistent with complete response 3. Followed by adjuvant radiation therapy if patient had lumpectomy  4.  Followed by antiestrogen therapy __________________________________________________________________________________ Current treatment: Herceptin/Perjeta Port site infection: Resolved Echo from 02/12/2021 reviewed, EF 60-65%  I reviewed Lisa Chapman's pathology results with her in detail.  I wrote shared with her that this is excellent news.  We discussed that she will continue Herceptin and Perjeta IV every 3 weeks to complete 1 year of therapy.  Lisa Chapman and I reviewed her labs which are completely normal today.  She will return in 3 weeks for labs, follow-up, her next Herceptin Perjeta.  I went ahead and also ordered her echocardiogram which is due in April 2023.  I placed a referral for her to be evaluated by our radiation oncologist to discuss adjuvant radiation.  She does live in [place] so she may opt to go to [facility] for her radiation.  We will see Lisa Chapman back in 3 weeks.  She knows to call if any questions or concerns arise between now and her next appointment.    All questions were answered. The patient knows to call the clinic with any problems, questions or concerns. We can certainly see the patient much sooner if necessary.  Total encounter time: 30 minutes in face to face visit time, chart review, lab  review, care coordination, and documentation of the encounter.  Lillard Anes, NP 04/12/21 9:24 AM Medical Oncology and Hematology [facility] [address] [place], [place] [address] Tel. [phone]  Fax. [phone]  *Total Encounter Time as defined by the Centers for Medicare and Medicaid Services includes, in addition to the face-to-face time of a patient visit (documented in the note above) non-face-to-face time: obtaining and reviewing outside history, ordering and reviewing medications, tests or procedures, care coordination (communications with other health care professionals or caregivers) and documentation in the medical record.

## 2021-04-12 NOTE — Progress Notes (Signed)
Patient declined to stay for 30 minute post observation after perjeta infusion. Ambulatory to lobby. ?

## 2021-04-12 NOTE — Assessment & Plan Note (Addendum)
Palpable lump in the right breast, mammogram detected 3.6 cm mass with several axillary lymph nodes, biopsy revealed grade 1-2 IDC, lymph nodes positive for cancer, ER 40%, PR 0%, Ki-67 15%, HER2 positive 3+ by IHC ?? ?Treatment plan: ?1. Neoadjuvant chemotherapy with [facility] Perjeta ?6 cycles followed by Herceptin Perjeta maintenance for 1 year ?2. Followed by lumpectomy and axillary node dissection completed on April 09, 2021 consistent with complete response ?3. Followed by adjuvant radiation therapy if patient had lumpectomy? ?4.  Followed by antiestrogen therapy ?__________________________________________________________________________________ ?Current treatment:?Herceptin/Perjeta ?Port site infection:?Resolved ?Echo from 02/12/2021 reviewed, EF 60-65% ? ?I reviewed Jomarie's pathology results with her in detail.  I wrote shared with her that this is excellent news.  We discussed that she will continue Herceptin and Perjeta IV every 3 weeks to complete 1 year of therapy. ? ?Bhavya and I reviewed her labs which are completely normal today.  She will return in 3 weeks for labs, follow-up, her next Herceptin Perjeta.  I went ahead and also ordered her echocardiogram which is due in April 2023. ? ?I placed a referral for her to be evaluated by our radiation oncologist to discuss adjuvant radiation.  She does live in [place] so she may opt to go to [facility] for her radiation. ? ?We will see Joclynn back in 3 weeks.  She knows to call if any questions or concerns arise between now and her next appointment. ? ? ?

## 2021-04-13 ENCOUNTER — Inpatient Hospital Stay: Payer: Medicare HMO | Admitting: Hematology and Oncology

## 2021-04-13 ENCOUNTER — Inpatient Hospital Stay: Payer: Medicare HMO

## 2021-04-13 ENCOUNTER — Telehealth: Payer: Self-pay | Admitting: *Deleted

## 2021-04-13 NOTE — Telephone Encounter (Signed)
Faxed referral for rad onc [place] Dr. Lynnette Caffey  ?[phone] ?

## 2021-04-19 ENCOUNTER — Encounter: Payer: Self-pay | Admitting: *Deleted

## 2021-04-23 ENCOUNTER — Telehealth: Payer: Self-pay | Admitting: *Deleted

## 2021-04-23 NOTE — Telephone Encounter (Signed)
Left message with patient's husband regarding her appt with Dr. Lynnette Caffey in [place].  Asked if she could return my call to see when her appt was there.  ?

## 2021-04-25 ENCOUNTER — Telehealth: Payer: Self-pay | Admitting: Adult Health

## 2021-04-25 NOTE — Telephone Encounter (Signed)
Scheduled appointment per 3/2 los. Patient is aware of upcoming appointments. ?

## 2021-04-26 ENCOUNTER — Encounter: Payer: Self-pay | Admitting: *Deleted

## 2021-04-26 ENCOUNTER — Encounter (HOSPITAL_COMMUNITY): Payer: Self-pay

## 2021-05-02 ENCOUNTER — Ambulatory Visit: Payer: Medicare HMO | Admitting: Physical Therapy

## 2021-05-02 NOTE — Progress Notes (Signed)
? ?Patient Care Team: ?[name], MD as PCP - General (Internal Medicine) ?Mauro Kaufmann, RN as Oncology Nurse Navigator ?Rockwell Germany, RN as Oncology Nurse Navigator ?Nicholas Lose, MD as Consulting Physician (Hematology and Oncology) ?Coralie Keens, MD as Consulting Physician (General Surgery) ? ?DIAGNOSIS:  ?Encounter Diagnosis  ?Name Primary?  ? Malignant neoplasm of upper-outer quadrant of right breast in female, estrogen receptor positive ([place])   ? ? ?SUMMARY OF ONCOLOGIC HISTORY: ?Oncology History  ?Malignant neoplasm of upper-outer quadrant of right breast in female, estrogen receptor positive ([place])  ?10/04/2020 Initial Diagnosis  ? Palpable lump in the right breast, mammogram detected 3.6 cm mass with several axillary lymph nodes, biopsy revealed grade 1-2 IDC, lymph nodes positive for cancer, ER 40%, PR 0%, Ki-67 15%, HER2 positive 3+ by IHC ?  ?10/31/2020 Cancer Staging  ? Staging form: Breast, AJCC 8th Edition ?- Clinical stage from 10/31/2020: Stage IIA (cT2, cN1, cM0, G2, ER+, PR-, HER2+) - Signed by Nicholas Lose, MD on 10/31/2020 ?Stage prefix: Initial diagnosis ?Histologic grading system: 3 grade system ? ?  ?11/17/2020 -  Chemotherapy  ? Patient is on Treatment Plan : BREAST  Docetaxel + Carboplatin + Trastuzumab + Pertuzumab  (TCHP) q21d   ?   ? ? ?CHIEF COMPLIANT: Cycle 9 Herceptin Perjeta (being held because of financial toxicity) ? ?INTERVAL HISTORY: Lisa Chapman is a  76 y.o. with above-mentioned history of breast cancer, currently on chemotherapy with TCHP. She presents to the clinic today for treatment. She states that she didn't want treatment today. She complains of finger tips been numb.  She tells me that her insurance only covers 80% of the cost on that because the cause of Loleta Dicker is so high she has been unable to pay her bills and therefore she wants to stop treatment here in order to not go into bankruptcy. ? ? ?ALLERGIES:  is allergic to other and boniva [ibandronic  acid]. ? ?MEDICATIONS:  ?Current Outpatient Medications  ?Medication Sig Dispense Refill  ? lidocaine-prilocaine (EMLA) cream Apply to affected area once 30 g 3  ? rosuvastatin (CRESTOR) 20 MG tablet Take 20 mg by mouth daily.    ? sodium chloride (OCEAN) 0.65 % SOLN nasal spray Place 1 spray into both nostrils as needed for congestion.    ? traMADol (ULTRAM) 50 MG tablet Take 1 tablet (50 mg total) by mouth every 6 (six) hours as needed. 25 tablet 0  ? XARELTO 20 MG TABS tablet TAKE 1 TABLET BY MOUTH DAILY WITH SUPPER. 30 tablet 11  ? ?No current facility-administered medications for this visit.  ? ? ?PHYSICAL EXAMINATION: ?ECOG PERFORMANCE STATUS: 1 - Symptomatic but completely ambulatory ? ?Vitals:  ? 05/03/21 0952  ?BP: (!) 166/74  ?Pulse: 74  ?Resp: 17  ?Temp: (!) 97.3 ?F (36.3 ?C)  ?SpO2: 97%  ? ?Filed Weights  ? 05/03/21 0952  ?Weight: 141 lb 9.6 oz (64.2 kg)  ? ?  ? ?LABORATORY DATA:  ?I have reviewed the data as listed ? ?  Latest Ref Rng & Units 05/03/2021  ?  9:40 AM 04/12/2021  ?  8:17 AM 03/23/2021  ? 10:20 AM  ?CMP  ?Glucose 70 - 99 mg/dL 99   99   110    ?BUN 8 - 23 mg/dL 10   11   14     ?Creatinine 0.44 - 1.00 mg/dL 0.68   0.68   0.55    ?Sodium 135 - 145 mmol/L 140   139  141    ?Potassium 3.5 - 5.1 mmol/L 4.1   3.8   3.9    ?Chloride 98 - 111 mmol/L 107   106   109    ?CO2 22 - 32 mmol/L 28   27   28     ?Calcium 8.9 - 10.3 mg/dL 9.6   9.4   9.2    ?Total Protein 6.5 - 8.1 g/dL 7.6   7.4   6.9    ?Total Bilirubin 0.3 - 1.2 mg/dL 0.4   0.4   0.2    ?Alkaline Phos 38 - 126 U/L 79   75   76    ?AST 15 - 41 U/L 16   12   15     ?ALT 0 - 44 U/L 10   8   9     ? ? ?Lab Results  ?Component Value Date  ? WBC 6.2 05/03/2021  ? HGB 11.1 (L) 05/03/2021  ? HCT 35.0 (L) 05/03/2021  ? MCV 88.4 05/03/2021  ? PLT 237 05/03/2021  ? NEUTROABS 3.2 05/03/2021  ? ? ?ASSESSMENT & PLAN:  ?Malignant neoplasm of upper-outer quadrant of right breast in female, estrogen receptor positive ([place]) ?Palpable lump in the right breast,  mammogram detected 3.6 cm mass with several axillary lymph nodes, biopsy revealed grade 1-2 IDC, lymph nodes positive for cancer, ER 40%, PR 0%, Ki-67 15%, HER2 positive 3+ by IHC ?  ?Treatment plan: ?1. Neoadjuvant chemotherapy with [place] Perjeta ?6 cycles followed by Herceptin Perjeta maintenance for 1 year ?2. Followed by lumpectomy and axillary node dissection completed on April 09, 2021 consistent with complete response ?3. Followed by adjuvant radiation therapy if patient had lumpectomy  ?4.  Followed by antiestrogen therapy ?__________________________________________________________________________________ ?Current treatment: Herceptin/Perjeta (to be completed end of September 2023) patient wants to discontinue treatment because of financial toxicity. ?  ?Echo from 02/12/2021 reviewed, EF 60-65% ?We will contact our social workers and financial advocates to figure out how she can continue with the treatment. ?If she cannot continue treatment then we will discontinue the treatment permanently. ? ?Return to clinic based upon availability of financial assistance.  She is going to find out if her radiation therapy will be covered 100% by her insurance. ? ?No orders of the defined types were placed in this encounter. ? ?The patient has a good understanding of the overall plan. she agrees with it. she will call with any problems that may develop before the next visit here. ?Total time spent: 30 mins including face to face time and time spent for planning, charting and co-ordination of care ? ? Harriette Ohara, MD ?05/03/21 ? ? ? I Gardiner Coins am scribing for Dr. Lindi Adie ? ?I have reviewed the above documentation for accuracy and completeness, and I agree with the above. ?  ?

## 2021-05-03 ENCOUNTER — Encounter: Payer: Self-pay | Admitting: Licensed Clinical Social Worker

## 2021-05-03 ENCOUNTER — Encounter: Payer: Self-pay | Admitting: Hematology and Oncology

## 2021-05-03 ENCOUNTER — Inpatient Hospital Stay: Payer: Medicare HMO | Admitting: Hematology and Oncology

## 2021-05-03 ENCOUNTER — Inpatient Hospital Stay: Payer: Medicare HMO

## 2021-05-03 ENCOUNTER — Other Ambulatory Visit: Payer: Self-pay

## 2021-05-03 DIAGNOSIS — C773 Secondary and unspecified malignant neoplasm of axilla and upper limb lymph nodes: Secondary | ICD-10-CM | POA: Diagnosis not present

## 2021-05-03 DIAGNOSIS — C50411 Malignant neoplasm of upper-outer quadrant of right female breast: Secondary | ICD-10-CM | POA: Diagnosis not present

## 2021-05-03 DIAGNOSIS — Z170 Estrogen receptor positive status [ER+]: Secondary | ICD-10-CM | POA: Diagnosis not present

## 2021-05-03 DIAGNOSIS — Z95828 Presence of other vascular implants and grafts: Secondary | ICD-10-CM

## 2021-05-03 DIAGNOSIS — Z5112 Encounter for antineoplastic immunotherapy: Secondary | ICD-10-CM | POA: Diagnosis not present

## 2021-05-03 DIAGNOSIS — Z452 Encounter for adjustment and management of vascular access device: Secondary | ICD-10-CM | POA: Diagnosis not present

## 2021-05-03 LAB — CBC WITH DIFFERENTIAL (CANCER CENTER ONLY)
Abs Immature Granulocytes: 0.01 10*3/uL (ref 0.00–0.07)
Basophils Absolute: 0 10*3/uL (ref 0.0–0.1)
Basophils Relative: 1 %
Eosinophils Absolute: 0.5 10*3/uL (ref 0.0–0.5)
Eosinophils Relative: 8 %
HCT: 35 % — ABNORMAL LOW (ref 36.0–46.0)
Hemoglobin: 11.1 g/dL — ABNORMAL LOW (ref 12.0–15.0)
Immature Granulocytes: 0 %
Lymphocytes Relative: 31 %
Lymphs Abs: 1.9 10*3/uL (ref 0.7–4.0)
MCH: 28 pg (ref 26.0–34.0)
MCHC: 31.7 g/dL (ref 30.0–36.0)
MCV: 88.4 fL (ref 80.0–100.0)
Monocytes Absolute: 0.5 10*3/uL (ref 0.1–1.0)
Monocytes Relative: 9 %
Neutro Abs: 3.2 10*3/uL (ref 1.7–7.7)
Neutrophils Relative %: 51 %
Platelet Count: 237 10*3/uL (ref 150–400)
RBC: 3.96 MIL/uL (ref 3.87–5.11)
RDW: 14.9 % (ref 11.5–15.5)
WBC Count: 6.2 10*3/uL (ref 4.0–10.5)
nRBC: 0 % (ref 0.0–0.2)

## 2021-05-03 LAB — CMP (CANCER CENTER ONLY)
ALT: 10 U/L (ref 0–44)
AST: 16 U/L (ref 15–41)
Albumin: 4.2 g/dL (ref 3.5–5.0)
Alkaline Phosphatase: 79 U/L (ref 38–126)
Anion gap: 5 (ref 5–15)
BUN: 10 mg/dL (ref 8–23)
CO2: 28 mmol/L (ref 22–32)
Calcium: 9.6 mg/dL (ref 8.9–10.3)
Chloride: 107 mmol/L (ref 98–111)
Creatinine: 0.68 mg/dL (ref 0.44–1.00)
GFR, Estimated: >60 mL/min (ref >60)
Glucose, Bld: 99 mg/dL (ref 70–99)
Potassium: 4.1 mmol/L (ref 3.5–5.1)
Sodium: 140 mmol/L (ref 135–145)
Total Bilirubin: 0.4 mg/dL (ref 0.3–1.2)
Total Protein: 7.6 g/dL (ref 6.5–8.1)

## 2021-05-03 MED ORDER — SODIUM CHLORIDE 0.9% FLUSH
10.0000 mL | Freq: Once | INTRAVENOUS | Status: AC
  Administered 2021-05-03: 10 mL

## 2021-05-03 NOTE — Progress Notes (Signed)
[place] ?Clinical Social Work ? ?Clinical Social Work was referred by nurse for assessment of psychosocial needs due to financial stress.  Clinical Social Worker contacted patient by phone  to offer support and assess for needs.  Pt is concerned with costs of care. She spoke with [employer] and her insurance to determine OOP max and deductible.  CSW provided information about potential assistance through foundations ([employer] in [place]). Mailed applications to patient today and encouraged her to call this CSW with questions and for help submitting applications when ready. ? ? ? ? ? ?Ordell Prichett E Jadin Creque, LCSW  ?Clinical Social Worker ?[place] ?      ? ?

## 2021-05-03 NOTE — Progress Notes (Signed)
Patient returned my call. ? ?I have spoken with patient to listen to concerns and to encourage her and explain what is showing she has remaining to meet with her insurance based on [employer]. Advised her to contact insurance to confirm specific amounts as they will be able to provide to her to date details. She seemed more assured and confident after me speaking with her and was understanding.  ? ?Advised if she is still in treatment in the 2024 when the insurance renews,  I would research for available copay assistance programs if funding is open at that time for her specific situation such as diagnosis, insurance , and treatment, and work with her to apply.  ? ?She has my card for any additional financial questions or concerns. ? ?Best, ?Marguarite Arbour ?

## 2021-05-03 NOTE — Progress Notes (Signed)
Called and left message for patient to return my call when she gets home regarding financial concern sent to me in staff message. ? ?She has my contact name and number to return my call. ?

## 2021-05-03 NOTE — Assessment & Plan Note (Signed)
Palpable lump in the right breast, mammogram detected 3.6 cm mass with several axillary lymph nodes, biopsy revealed grade 1-2 IDC, lymph nodes positive for cancer, ER 40%, PR 0%, Ki-67 15%, HER2 positive 3+ by IHC ?? ?Treatment plan: ?1. Neoadjuvant chemotherapy with [place] Perjeta ?6 cycles followed by Herceptin Perjeta maintenance for 1 year ?2. Followed by lumpectomy and axillary node dissection completed on April 09, 2021 consistent with complete response ?3. Followed by adjuvant radiation therapy if patient had lumpectomy? ?4.  Followed by antiestrogen therapy ?__________________________________________________________________________________ ?Current treatment:?Herceptin/Perjeta (to be completed end of September 2023) ?Port site infection:?Resolved ?Echo from 02/12/2021 reviewed, EF 60-65% ?Return to clinic every 3 weeks for Herceptin Perjeta and every 6 weeks to follow-up with me. ?

## 2021-05-11 ENCOUNTER — Other Ambulatory Visit: Payer: Self-pay | Admitting: Student

## 2021-05-11 DIAGNOSIS — L0390 Cellulitis, unspecified: Secondary | ICD-10-CM

## 2021-05-15 ENCOUNTER — Ambulatory Visit (HOSPITAL_COMMUNITY)
Admission: RE | Admit: 2021-05-15 | Discharge: 2021-05-15 | Disposition: A | Discharge status: Home / Self Care | Payer: Medicare HMO | Source: Ambulatory Visit | Attending: Adult Health | Admitting: Adult Health

## 2021-05-15 DIAGNOSIS — E785 Hyperlipidemia, unspecified: Secondary | ICD-10-CM | POA: Insufficient documentation

## 2021-05-15 DIAGNOSIS — Z5181 Encounter for therapeutic drug level monitoring: Secondary | ICD-10-CM | POA: Diagnosis not present

## 2021-05-15 DIAGNOSIS — I10 Essential (primary) hypertension: Secondary | ICD-10-CM | POA: Diagnosis not present

## 2021-05-15 DIAGNOSIS — C50411 Malignant neoplasm of upper-outer quadrant of right female breast: Secondary | ICD-10-CM | POA: Diagnosis not present

## 2021-05-15 DIAGNOSIS — Z170 Estrogen receptor positive status [ER+]: Secondary | ICD-10-CM | POA: Diagnosis not present

## 2021-05-15 DIAGNOSIS — Z0189 Encounter for other specified special examinations: Secondary | ICD-10-CM

## 2021-05-15 LAB — ECHOCARDIOGRAM COMPLETE
AR max vel: 1.96 cm2
AV Area VTI: 1.97 cm2
AV Area mean vel: 1.92 cm2
AV Mean grad: 6 mmHg
AV Peak grad: 12 mmHg
Ao pk vel: 1.73 m/s
Area-P 1/2: 5.13 cm2
Calc EF: 53.8 %
MV VTI: 3.51 cm2
S' Lateral: 2.6 cm
Single Plane A2C EF: 59.4 %
Single Plane A4C EF: 49.8 %

## 2021-05-18 ENCOUNTER — Ambulatory Visit: Payer: Medicare HMO

## 2021-05-18 ENCOUNTER — Ambulatory Visit
Admission: RE | Admit: 2021-05-18 | Discharge: 2021-05-18 | Disposition: A | Discharge status: Home / Self Care | Payer: Medicare HMO | Source: Ambulatory Visit | Attending: Student | Admitting: Student

## 2021-05-18 DIAGNOSIS — R922 Inconclusive mammogram: Secondary | ICD-10-CM | POA: Diagnosis not present

## 2021-05-18 DIAGNOSIS — L0390 Cellulitis, unspecified: Secondary | ICD-10-CM

## 2021-05-18 HISTORY — DX: Personal history of antineoplastic chemotherapy: Z92.21

## 2021-05-18 HISTORY — DX: Malignant neoplasm of unspecified site of unspecified female breast: C50.919

## 2021-05-18 IMAGING — MG MM DIGITAL DIAGNOSTIC UNILAT*R* W/ TOMO W/ CAD
4 series · 5 of 12 positions shown · non-contrast
Comparison: Previous exam(s).

CLINICAL DATA: History of RIGHT lumpectomy. Patient is undergoing
neoadjuvant treatment prior to planned radiation treatment. RIGHT
breast cellulitis. On [DATE], Dr. APAE aspirated 155 ml from
seroma in the RIGHT axilla. Patient has developed redness and
tenderness throughout the RIGHT breast, particularly in the LATERAL
aspect. Patient is on day 7 of 7 of doxycycline, and reports
approximately 60% improvement in her symptoms. She denies fever but
has noticed warmth in the breast.

EXAM:
DIGITAL DIAGNOSTIC UNILATERAL RIGHT MAMMOGRAM WITH TOMOSYNTHESIS AND
CAD; ULTRASOUND RIGHT BREAST LIMITED
TECHNIQUE: Right digital diagnostic mammography and breast tomosynthesis was
performed. The images were evaluated with computer-aided detection.;
Targeted ultrasound examination of the right breast was performed

[R CC synth-2D]
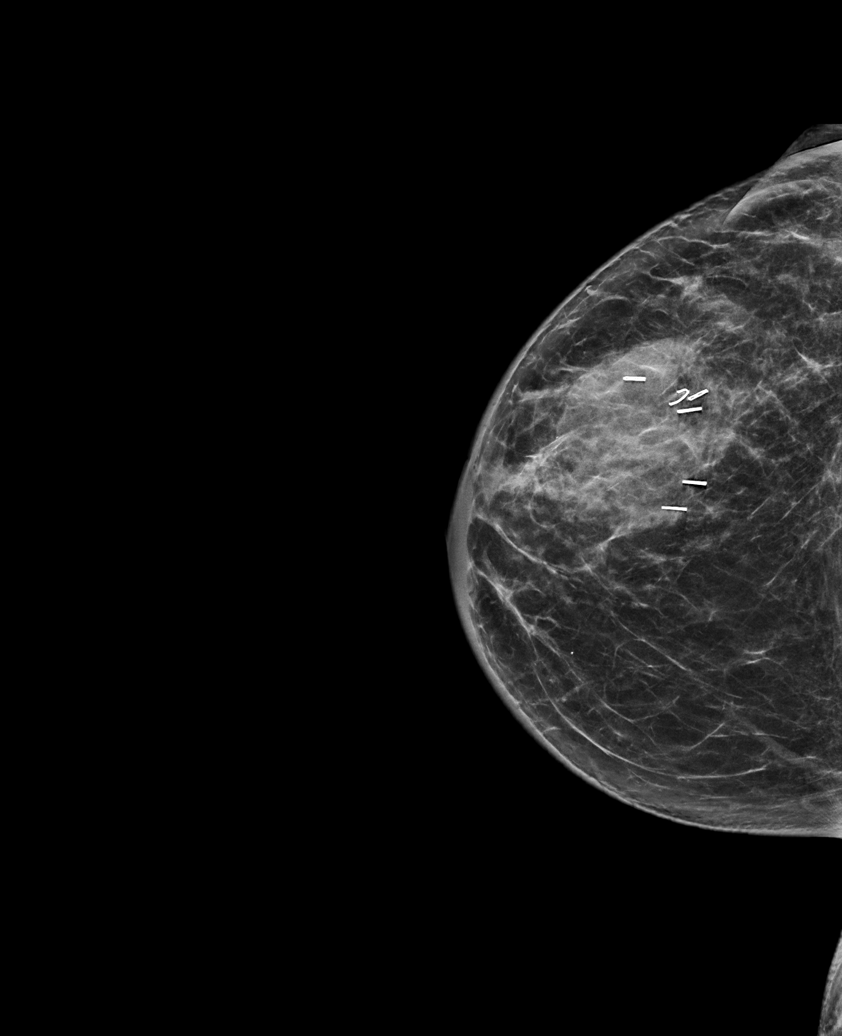

[R MLO synth-2D]
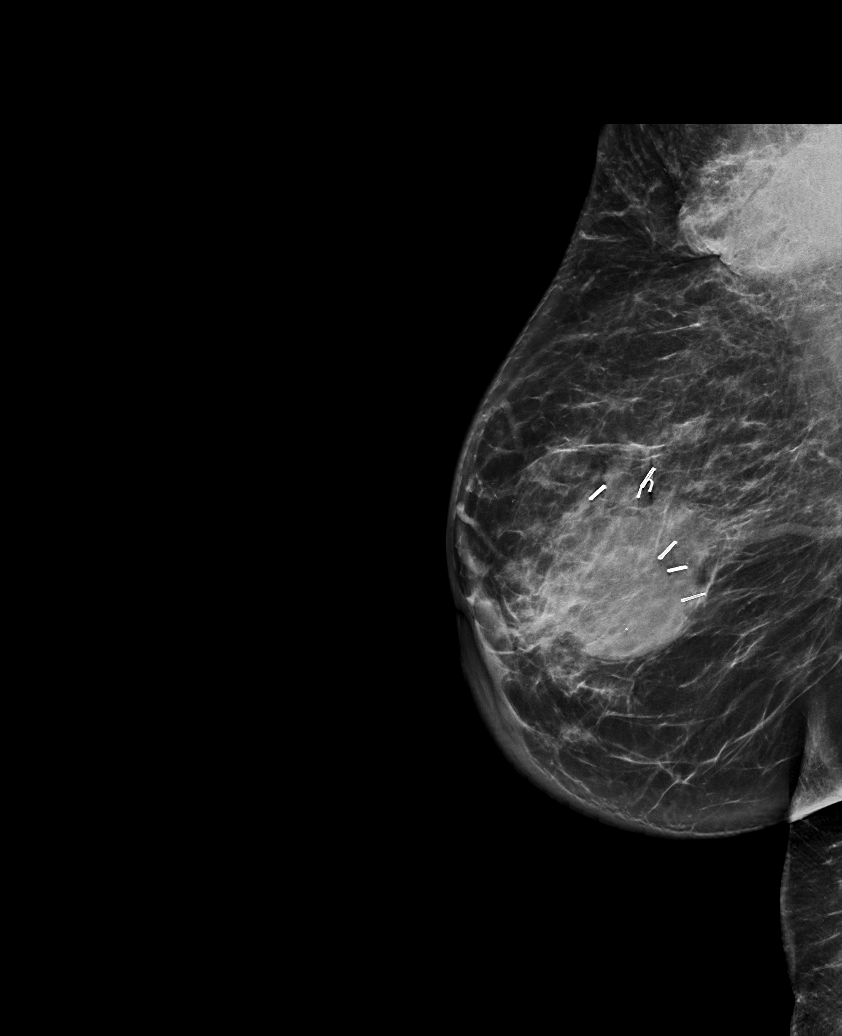

[R CC tomo · 2 of 71 frames shown]
[frame 23/71]
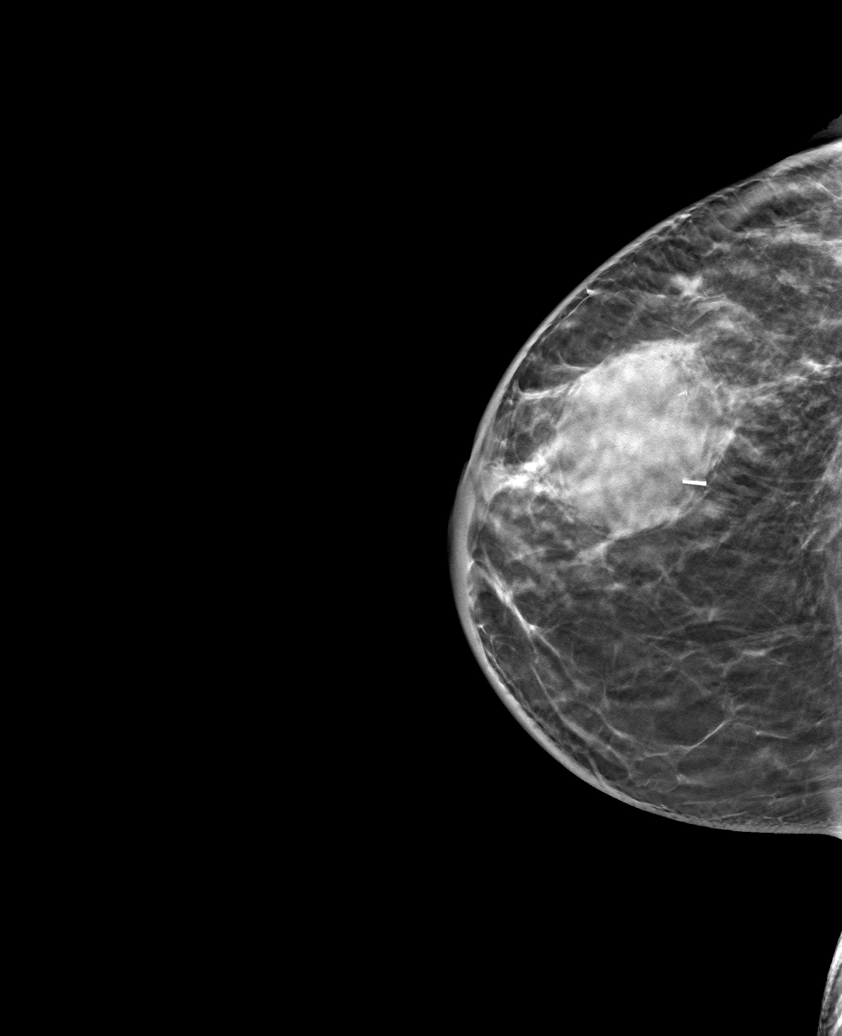
[frame 36/71]
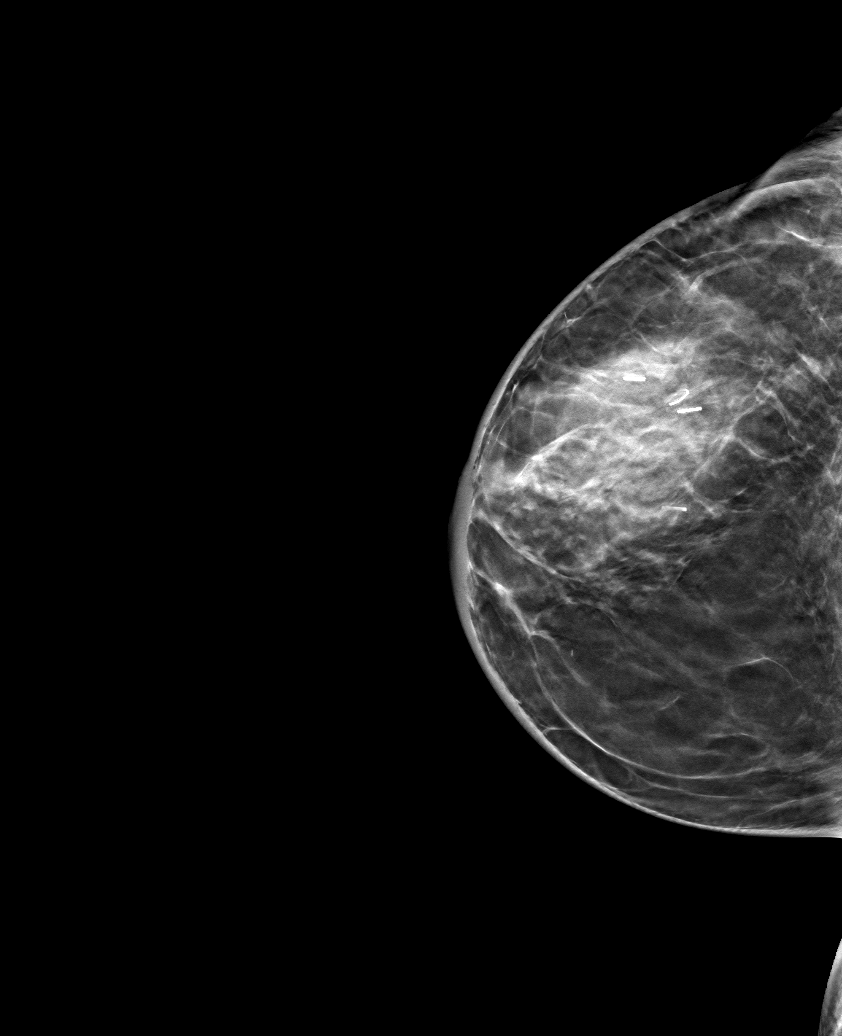

[R MLO tomo · tomo slice 41/81.0]
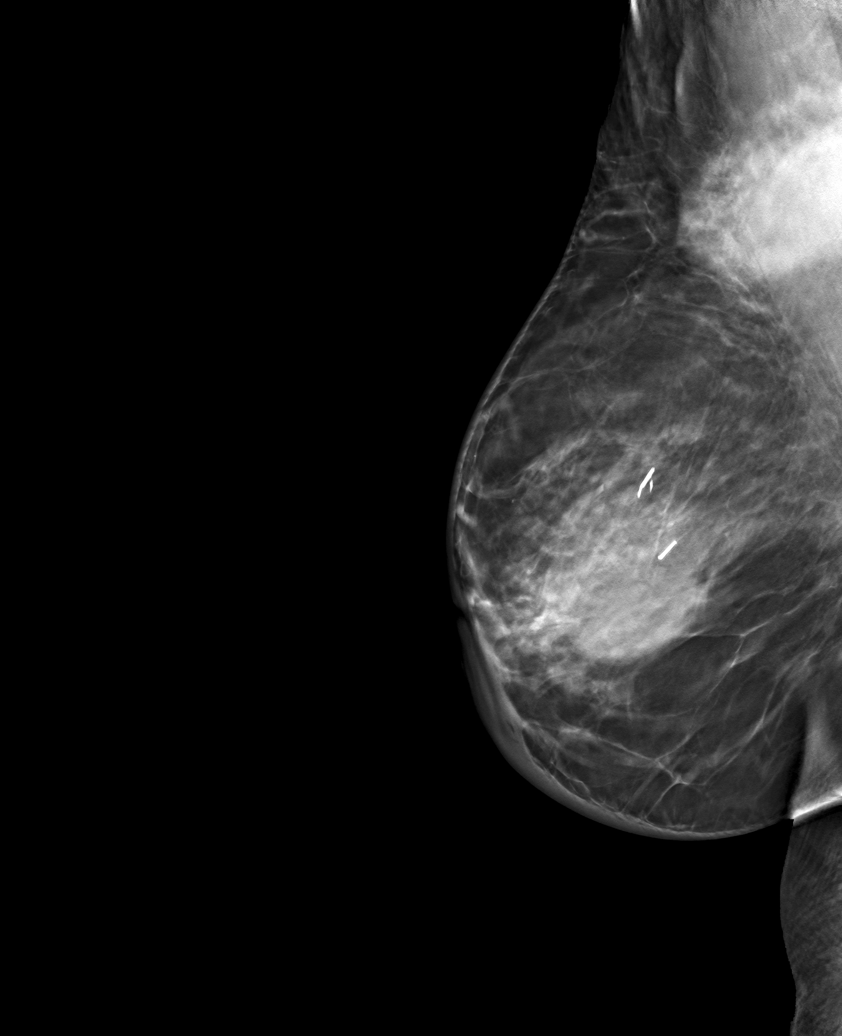

[5 of 12 positions shown; findings below may reference images not displayed]

ACR Breast Density Category b: There are scattered areas of
fibroglandular density.
FINDINGS: Interval postoperative changes in the RIGHT breast and axilla.
Seroma cavity is present in the LATERAL retroareolar region of the
RIGHT breast. Seroma cavity identified in the LOWER RIGHT axilla.
There is mild skin and trabecular thickening compared to
preoperative images.

On physical exam, there is faint erythema involving central breast
measuring approximately 15-20 centimeters in diameter. The patient
reports redness has improved approximately 60% since starting
antibiotics.

Targeted ultrasound is performed, showing edema, skin thickening in
the LATERAL aspect of the RIGHT breast. Seroma cavity in the 9
o'clock location 3 centimeters from the nipple measures 4.5 x 2.5 x
3.9 centimeters.

In the LOWER RIGHT axilla, adjacent to the scar, small postoperative
fluid collection measures 2.6 centimeters.

No evidence for abscess.
IMPRESSION: 1. Expected postoperative changes in the RIGHT breast, including
centimeter seroma cavity in the 9 o'clock location and small
postoperative fluid collection in the RIGHT axilla, 2.6 centimeters.
There is no evidence for abscess.
2. Faint erythema and erythema persist. Patient has improved
significantly on doxycycline.

RECOMMENDATION:
Recommend a second course of doxycycline. Patient was given a
prescription for a 7 day course of doxycycline. I asked her to
follow-up with Dr. APAE if symptoms worsen.

Patient will be due for bilateral diagnostic mammogram in [DATE].

I have discussed the findings and recommendations with the patient.
If applicable, a reminder letter will be sent to the patient
regarding the next appointment.

BI-RADS CATEGORY  2: Benign.

## 2021-05-21 ENCOUNTER — Encounter: Payer: Self-pay | Admitting: *Deleted

## 2021-05-24 ENCOUNTER — Other Ambulatory Visit: Payer: Self-pay

## 2021-05-24 ENCOUNTER — Inpatient Hospital Stay: Payer: Medicare HMO | Attending: Hematology and Oncology

## 2021-05-24 VITALS — BP 141/61 | HR 61 | Temp 98.6°F | Resp 60

## 2021-05-24 DIAGNOSIS — Z5112 Encounter for antineoplastic immunotherapy: Secondary | ICD-10-CM | POA: Insufficient documentation

## 2021-05-24 DIAGNOSIS — C50411 Malignant neoplasm of upper-outer quadrant of right female breast: Secondary | ICD-10-CM | POA: Diagnosis present

## 2021-05-24 DIAGNOSIS — Z170 Estrogen receptor positive status [ER+]: Secondary | ICD-10-CM

## 2021-05-24 MED ORDER — HEPARIN SOD (PORK) LOCK FLUSH 100 UNIT/ML IV SOLN
500.0000 [IU] | Freq: Once | INTRAVENOUS | Status: AC | PRN
  Administered 2021-05-24: 500 [IU]

## 2021-05-24 MED ORDER — SODIUM CHLORIDE 0.9 % IV SOLN
420.0000 mg | Freq: Once | INTRAVENOUS | Status: AC
  Administered 2021-05-24: 420 mg via INTRAVENOUS
  Filled 2021-05-24: qty 14

## 2021-05-24 MED ORDER — SODIUM CHLORIDE 0.9 % IV SOLN: Freq: Once | INTRAVENOUS | Status: AC

## 2021-05-24 MED ORDER — SODIUM CHLORIDE 0.9% FLUSH
10.0000 mL | INTRAVENOUS | Status: DC | PRN
  Administered 2021-05-24: 10 mL

## 2021-05-24 MED ORDER — TRASTUZUMAB-ANNS CHEMO 150 MG IV SOLR
6.0000 mg/kg | Freq: Once | INTRAVENOUS | Status: AC
  Administered 2021-05-24: 420 mg via INTRAVENOUS
  Filled 2021-05-24: qty 20

## 2021-05-24 MED ORDER — DIPHENHYDRAMINE HCL 25 MG PO CAPS
25.0000 mg | ORAL_CAPSULE | Freq: Once | ORAL | Status: AC
  Administered 2021-05-24: 25 mg via ORAL
  Filled 2021-05-24: qty 1

## 2021-05-24 MED ORDER — ACETAMINOPHEN 325 MG PO TABS
650.0000 mg | ORAL_TABLET | Freq: Once | ORAL | Status: AC
  Administered 2021-05-24: 650 mg via ORAL
  Filled 2021-05-24: qty 2

## 2021-05-24 NOTE — Patient Instructions (Signed)
[facility]  Discharge Instructions: ?Thank you for choosing [facility] to provide your oncology and hematology care.  ? ?If you have a lab appointment with the [facility], please go directly to the [facility] and check in at the registration area. ?  ?Wear comfortable clothing and clothing appropriate for easy access to any Portacath or PICC line.  ? ?We strive to give you quality time with your provider. You may need to reschedule your appointment if you arrive late (15 or more minutes).  Arriving late affects you and other patients whose appointments are after yours.  Also, if you miss three or more appointments without notifying the office, you may be dismissed from the clinic at the provider?s discretion.    ?  ?For prescription refill requests, have your pharmacy contact our office and allow 72 hours for refills to be completed.   ? ?Today you received the following chemotherapy and/or immunotherapy agents: Herceptin and Perjeta. ?  ?To help prevent nausea and vomiting after your treatment, we encourage you to take your nausea medication as directed. ? ?BELOW ARE SYMPTOMS THAT SHOULD BE REPORTED IMMEDIATELY: ?*FEVER GREATER THAN 100.4 F (38 ?C) OR HIGHER ?*CHILLS OR SWEATING ?*NAUSEA AND VOMITING THAT IS NOT CONTROLLED WITH YOUR NAUSEA MEDICATION ?*UNUSUAL SHORTNESS OF BREATH ?*UNUSUAL BRUISING OR BLEEDING ?*URINARY PROBLEMS (pain or burning when urinating, or frequent urination) ?*BOWEL PROBLEMS (unusual diarrhea, constipation, pain near the anus) ?TENDERNESS IN MOUTH AND THROAT WITH OR WITHOUT PRESENCE OF ULCERS (sore throat, sores in mouth, or a toothache) ?UNUSUAL RASH, SWELLING OR PAIN  ?UNUSUAL VAGINAL DISCHARGE OR ITCHING  ? ?Items with * indicate a potential emergency and should be followed up as soon as possible or go to the Emergency Department if any problems should occur. ? ?Please show the CHEMOTHERAPY ALERT CARD or IMMUNOTHERAPY ALERT CARD at  check-in to the Emergency Department and triage nurse. ? ?Should you have questions after your visit or need to cancel or reschedule your appointment, please contact [facility]  Dept: [phone]  and follow the prompts.  Office hours are 8:00 a.m. to 4:30 p.m. Monday - Friday. Please note that voicemails left after 4:00 p.m. may not be returned until the following business day.  We are closed weekends and major holidays. You have access to a nurse at all times for urgent questions. Please call the main number to the clinic Dept: [phone] and follow the prompts. ? ? ?For any non-urgent questions, you may also contact your provider using MyChart. We now offer e-Visits for anyone 33 and older to request care online for non-urgent symptoms. For details visit [email or web address]. ?  ?Also download the MyChart app! Go to the app store, search "MyChart", open the app, select [facility], and log in with your MyChart username and password. ? ?Due to Covid, a mask is required upon entering the hospital/clinic. If you do not have a mask, one will be given to you upon arrival. For doctor visits, patients may have 1 support person aged 33 or older with them. For treatment visits, patients cannot have anyone with them due to current Covid guidelines and our immunocompromised population.  ? ?

## 2021-05-24 NOTE — Progress Notes (Signed)
Refused to stay for the 30 min post observation after Perjeta. ?

## 2021-05-31 NOTE — Progress Notes (Signed)
? ?Patient Care Team: ?[name], MD as PCP - General (Internal Medicine) ?Mauro Kaufmann, RN as Oncology Nurse Navigator ?Rockwell Germany, RN as Oncology Nurse Navigator ?Nicholas Lose, MD as Consulting Physician (Hematology and Oncology) ?Coralie Keens, MD as Consulting Physician (General Surgery) ? ?DIAGNOSIS:  ?Encounter Diagnosis  ?Name Primary?  ? Malignant neoplasm of upper-outer quadrant of right breast in female, estrogen receptor positive ([place])   ? ? ?SUMMARY OF ONCOLOGIC HISTORY: ?Oncology History  ?Malignant neoplasm of upper-outer quadrant of right breast in female, estrogen receptor positive ([place])  ?10/04/2020 Initial Diagnosis  ? Palpable lump in the right breast, mammogram detected 3.6 cm mass with several axillary lymph nodes, biopsy revealed grade 1-2 IDC, lymph nodes positive for cancer, ER 40%, PR 0%, Ki-67 15%, HER2 positive 3+ by IHC ?  ?10/31/2020 Cancer Staging  ? Staging form: Breast, AJCC 8th Edition ?- Clinical stage from 10/31/2020: Stage IIA (cT2, cN1, cM0, G2, ER+, PR-, HER2+) - Signed by Nicholas Lose, MD on 10/31/2020 ?Stage prefix: Initial diagnosis ?Histologic grading system: 3 grade system ? ?  ?11/17/2020 -  Chemotherapy  ? Patient is on Treatment Plan : BREAST  Docetaxel + Carboplatin + Trastuzumab + Pertuzumab  (TCHP) q21d   ? ?  ?  ? ?CHIEF COMPLIANT:   Herceptin Perjeta maintenance ? ?INTERVAL HISTORY: Lisa Chapman is a 76 y.o. with above-mentioned history of breast cancer, currently on chemotherapy with TCHP. She presents to the clinic today for treatment.  She is tolerating Herceptin and Perjeta extremely well without any problems or concerns.  Denies any nausea or vomiting denies any diarrhea or constipation. ? ? ?ALLERGIES:  is allergic to other and boniva [ibandronic acid]. ? ?MEDICATIONS:  ?Current Outpatient Medications  ?Medication Sig Dispense Refill  ? doxycycline (VIBRAMYCIN) 100 MG capsule Take 100 mg by mouth 2 (two) times daily. Will complete antibiotic  tomorrow    ? lidocaine-prilocaine (EMLA) cream Apply to affected area once 30 g 3  ? rosuvastatin (CRESTOR) 20 MG tablet Take 20 mg by mouth daily.    ? sodium chloride (OCEAN) 0.65 % SOLN nasal spray Place 1 spray into both nostrils as needed for congestion.    ? traMADol (ULTRAM) 50 MG tablet Take 1 tablet (50 mg total) by mouth every 6 (six) hours as needed. 25 tablet 0  ? XARELTO 20 MG TABS tablet TAKE 1 TABLET BY MOUTH DAILY WITH SUPPER. 30 tablet 11  ? ?No current facility-administered medications for this visit.  ? ? ?PHYSICAL EXAMINATION: ?ECOG PERFORMANCE STATUS: 1 - Symptomatic but completely ambulatory ? ?Vitals:  ? 06/14/21 0845  ?BP: (!) 149/81  ?Pulse: 73  ?Resp: 18  ?Temp: (!) 97.3 ?F (36.3 ?C)  ?SpO2: 99%  ? ?Filed Weights  ? 06/14/21 0845  ?Weight: 142 lb 1.6 oz (64.5 kg)  ? ?  ? ?LABORATORY DATA:  ?I have reviewed the data as listed ? ?  Latest Ref Rng & Units 05/03/2021  ?  9:40 AM 04/12/2021  ?  8:17 AM 03/23/2021  ? 10:20 AM  ?CMP  ?Glucose 70 - 99 mg/dL 99   99   110    ?BUN 8 - 23 mg/dL 10   11   14     ?Creatinine 0.44 - 1.00 mg/dL 0.68   0.68   0.55    ?Sodium 135 - 145 mmol/L 140   139   141    ?Potassium 3.5 - 5.1 mmol/L 4.1   3.8   3.9    ?  Chloride 98 - 111 mmol/L 107   106   109    ?CO2 22 - 32 mmol/L 28   27   28     ?Calcium 8.9 - 10.3 mg/dL 9.6   9.4   9.2    ?Total Protein 6.5 - 8.1 g/dL 7.6   7.4   6.9    ?Total Bilirubin 0.3 - 1.2 mg/dL 0.4   0.4   0.2    ?Alkaline Phos 38 - 126 U/L 79   75   76    ?AST 15 - 41 U/L 16   12   15     ?ALT 0 - 44 U/L 10   8   9     ? ? ?Lab Results  ?Component Value Date  ? WBC 6.2 05/03/2021  ? HGB 11.1 (L) 05/03/2021  ? HCT 35.0 (L) 05/03/2021  ? MCV 88.4 05/03/2021  ? PLT 237 05/03/2021  ? NEUTROABS 3.2 05/03/2021  ? ? ?ASSESSMENT & PLAN:  ?Malignant neoplasm of upper-outer quadrant of right breast in female, estrogen receptor positive ([place]) ?Palpable lump in the right breast, mammogram detected 3.6 cm mass with several axillary lymph nodes, biopsy  revealed grade 1-2 IDC, lymph nodes positive for cancer, ER 40%, PR 0%, Ki-67 15%, HER2 positive 3+ by IHC ?  ?Treatment plan: ?1. Neoadjuvant chemotherapy with [place] Perjeta ?6 cycles followed by Herceptin Perjeta maintenance for 1 year ?2. Followed by lumpectomy and axillary node dissection completed on April 09, 2021 consistent with complete response ?3.  Adjuvant radiation in [place] ?4.  Followed by antiestrogen therapy with letrozole 2.5 mg daily started 06/14/2021 ?__________________________________________________________________________________ ?Current treatment: Herceptin/Perjeta (to be completed end of September 2023)   ?Patient was able to meet her deductible and therefore she is willing to continue with the Herceptin and Perjeta treatment. ?Echo from 05/15/2021 reviewed, EF 60-65% ?  ?Letrozole counseling: We discussed the risks and benefits of anti-estrogen therapy with aromatase inhibitors. These include but not limited to insomnia, hot flashes, mood changes, vaginal dryness, bone density loss, and weight gain. We strongly believe that the benefits far outweigh the risks. Patient understands these risks and consented to starting treatment. Planned treatment duration is 5-7 years. ? ?Return to clinic every 3 weeks for Herceptin and Perjeta every 6 weeks for follow-up with [place]. ? ? ?No orders of the defined types were placed in this encounter. ? ?The patient has a good understanding of the overall plan. she agrees with it. she will call with any problems that may develop before the next visit here. ?Total time spent: 30 mins including face to face time and time spent for planning, charting and co-ordination of care ? ? Harriette Ohara, MD ?06/14/21 ? ? ? I Gardiner Coins am scribing for Dr. Lindi Adie ? ?I have reviewed the above documentation for accuracy and completeness, and I agree with the above. ?  ?

## 2021-06-01 ENCOUNTER — Encounter: Payer: Self-pay | Admitting: *Deleted

## 2021-06-01 ENCOUNTER — Telehealth: Payer: Self-pay | Admitting: *Deleted

## 2021-06-01 NOTE — Telephone Encounter (Signed)
Left message for a return phone call for update regarding her xrt appt with Dr. Lynnette Caffey which looks like she cancelled her appt.  ?

## 2021-06-13 ENCOUNTER — Encounter: Payer: Self-pay | Admitting: *Deleted

## 2021-06-14 ENCOUNTER — Other Ambulatory Visit: Payer: Self-pay

## 2021-06-14 ENCOUNTER — Inpatient Hospital Stay: Payer: Medicare HMO

## 2021-06-14 ENCOUNTER — Inpatient Hospital Stay (HOSPITAL_BASED_OUTPATIENT_CLINIC_OR_DEPARTMENT_OTHER): Payer: Medicare HMO | Admitting: Hematology and Oncology

## 2021-06-14 ENCOUNTER — Inpatient Hospital Stay: Payer: Medicare HMO | Attending: Hematology and Oncology

## 2021-06-14 DIAGNOSIS — C50411 Malignant neoplasm of upper-outer quadrant of right female breast: Secondary | ICD-10-CM

## 2021-06-14 DIAGNOSIS — Z170 Estrogen receptor positive status [ER+]: Secondary | ICD-10-CM | POA: Diagnosis not present

## 2021-06-14 DIAGNOSIS — Z5112 Encounter for antineoplastic immunotherapy: Secondary | ICD-10-CM | POA: Insufficient documentation

## 2021-06-14 DIAGNOSIS — Z95828 Presence of other vascular implants and grafts: Secondary | ICD-10-CM

## 2021-06-14 LAB — CBC WITH DIFFERENTIAL (CANCER CENTER ONLY)
Abs Immature Granulocytes: 0.01 10*3/uL (ref 0.00–0.07)
Basophils Absolute: 0 10*3/uL (ref 0.0–0.1)
Basophils Relative: 0 %
Eosinophils Absolute: 0.2 10*3/uL (ref 0.0–0.5)
Eosinophils Relative: 2 %
HCT: 35.6 % — ABNORMAL LOW (ref 36.0–46.0)
Hemoglobin: 11.1 g/dL — ABNORMAL LOW (ref 12.0–15.0)
Immature Granulocytes: 0 %
Lymphocytes Relative: 28 %
Lymphs Abs: 1.8 10*3/uL (ref 0.7–4.0)
MCH: 26.7 pg (ref 26.0–34.0)
MCHC: 31.2 g/dL (ref 30.0–36.0)
MCV: 85.8 fL (ref 80.0–100.0)
Monocytes Absolute: 0.6 10*3/uL (ref 0.1–1.0)
Monocytes Relative: 9 %
Neutro Abs: 3.8 10*3/uL (ref 1.7–7.7)
Neutrophils Relative %: 61 %
Platelet Count: 229 10*3/uL (ref 150–400)
RBC: 4.15 MIL/uL (ref 3.87–5.11)
RDW: 15.5 % (ref 11.5–15.5)
WBC Count: 6.3 10*3/uL (ref 4.0–10.5)
nRBC: 0 % (ref 0.0–0.2)

## 2021-06-14 LAB — CMP (CANCER CENTER ONLY)
ALT: 9 U/L (ref 0–44)
AST: 16 U/L (ref 15–41)
Albumin: 3.9 g/dL (ref 3.5–5.0)
Alkaline Phosphatase: 69 U/L (ref 38–126)
Anion gap: 6 (ref 5–15)
BUN: 14 mg/dL (ref 8–23)
CO2: 27 mmol/L (ref 22–32)
Calcium: 9.1 mg/dL (ref 8.9–10.3)
Chloride: 108 mmol/L (ref 98–111)
Creatinine: 0.68 mg/dL (ref 0.44–1.00)
GFR, Estimated: >60 mL/min (ref >60)
Glucose, Bld: 100 mg/dL — ABNORMAL HIGH (ref 70–99)
Potassium: 3.9 mmol/L (ref 3.5–5.1)
Sodium: 141 mmol/L (ref 135–145)
Total Bilirubin: 0.3 mg/dL (ref 0.3–1.2)
Total Protein: 7.4 g/dL (ref 6.5–8.1)

## 2021-06-14 MED ORDER — ACETAMINOPHEN 325 MG PO TABS
650.0000 mg | ORAL_TABLET | Freq: Once | ORAL | Status: AC
  Administered 2021-06-14: 650 mg via ORAL
  Filled 2021-06-14: qty 2

## 2021-06-14 MED ORDER — SODIUM CHLORIDE 0.9 % IV SOLN
420.0000 mg | Freq: Once | INTRAVENOUS | Status: AC
  Administered 2021-06-14: 420 mg via INTRAVENOUS
  Filled 2021-06-14: qty 14

## 2021-06-14 MED ORDER — SODIUM CHLORIDE 0.9% FLUSH
10.0000 mL | Freq: Once | INTRAVENOUS | Status: AC
  Administered 2021-06-14: 10 mL

## 2021-06-14 MED ORDER — DIPHENHYDRAMINE HCL 25 MG PO CAPS
25.0000 mg | ORAL_CAPSULE | Freq: Once | ORAL | Status: AC
  Administered 2021-06-14: 25 mg via ORAL
  Filled 2021-06-14: qty 1

## 2021-06-14 MED ORDER — TRASTUZUMAB-ANNS CHEMO 150 MG IV SOLR
6.0000 mg/kg | Freq: Once | INTRAVENOUS | Status: AC
  Administered 2021-06-14: 420 mg via INTRAVENOUS
  Filled 2021-06-14: qty 20

## 2021-06-14 MED ORDER — LETROZOLE 2.5 MG PO TABS: 2.5000 mg | ORAL_TABLET | Freq: Every day | ORAL | 3 refills | Status: DC

## 2021-06-14 MED ORDER — SODIUM CHLORIDE 0.9% FLUSH: 10.0000 mL | INTRAVENOUS | Status: DC | PRN

## 2021-06-14 MED ORDER — HEPARIN SOD (PORK) LOCK FLUSH 100 UNIT/ML IV SOLN: 500.0000 [IU] | Freq: Once | INTRAVENOUS | Status: DC | PRN

## 2021-06-14 MED ORDER — SODIUM CHLORIDE 0.9 % IV SOLN: Freq: Once | INTRAVENOUS | Status: AC

## 2021-06-14 NOTE — Patient Instructions (Signed)
[facility]COLOG[facility]  Discharge Instructions: Thank you for choosing [facility] to provide your oncology and hematology care.   If you have a lab appointment with the Cancer Center, please go directly to the Cancer Center and check in at the registration area.   Wear comfortable clothing and clothing appropriate for easy access to any Portacath or PICC line.   We strive to give you quality time with your provider. You may need to reschedule your appointment if you arrive late (15 or more minutes).  Arriving late affects you and other patients whose appointments are after yours.  Also, if you miss three or more appointments without notifying the office, you may be dismissed from the clinic at the provider's discretion.      For prescription refill requests, have your pharmacy contact our office and allow 72 hours for refills to be completed.    Today you received the following chemotherapy and/or immunotherapy agents: Kanjinti, Perjeta      To help prevent nausea and vomiting after your treatment, we encourage you to take your nausea medication as directed.  BELOW ARE SYMPTOMS THAT SHOULD BE REPORTED IMMEDIATELY: *FEVER GREATER THAN 100.4 F (38 C) OR HIGHER *CHILLS OR SWEATING *NAUSEA AND VOMITING THAT IS NOT CONTROLLED WITH YOUR NAUSEA MEDICATION *UNUSUAL SHORTNESS OF BREATH *UNUSUAL BRUISING OR BLEEDING *URINARY PROBLEMS (pain or burning when urinating, or frequent urination) *BOWEL PROBLEMS (unusual diarrhea, constipation, pain near the anus) TENDERNESS IN MOUTH AND THROAT WITH OR WITHOUT PRESENCE OF ULCERS (sore throat, sores in mouth, or a toothache) UNUSUAL RASH, SWELLING OR PAIN  UNUSUAL VAGINAL DISCHARGE OR ITCHING   Items with * indicate a potential emergency and should be followed up as soon as possible or go to the Emergency Department if any problems should occur.  Please show the CHEMOTHERAPY ALERT CARD or IMMUNOTHERAPY ALERT CARD at  check-in to the Emergency Department and triage nurse.  Should you have questions after your visit or need to cancel or reschedule your appointment, please contact [facility]  Dept: [phone]  and follow the prompts.  Office hours are 8:00 a.m. to 4:30 p.m. Monday - Friday. Please note that voicemails left after 4:00 p.m. may not be returned until the following business day.  We are closed weekends and major holidays. You have access to a nurse at all times for urgent questions. Please call the main number to the clinic Dept: [phone] and follow the prompts.   For any non-urgent questions, you may also contact your provider using MyChart. We now offer e-Visits for anyone 18 and older to request care online for non-urgent symptoms. For details visit [email or web address].   Also download the MyChart app! Go to the app store, search "MyChart", open the app, select [facility], and log in with your MyChart username and password.  Due to Covid, a mask is required upon entering the hospital/clinic. If you do not have a mask, one will be given to you upon arrival. For doctor visits, patients may have 1 support person aged 18 or older with them. For treatment visits, patients cannot have anyone with them due to current Covid guidelines and our immunocompromised population.  

## 2021-06-14 NOTE — Assessment & Plan Note (Signed)
Palpable lump in the right breast, mammogram detected 3.6 cm mass with several axillary lymph nodes, biopsy revealed grade 1-2 IDC, lymph nodes positive for cancer, ER 40%, PR 0%, Ki-67 15%, HER2 positive 3+ by IHC ?? ?Treatment plan: ?1. Neoadjuvant chemotherapy with [place] Perjeta ?6 cycles followed by Herceptin Perjeta maintenance for 1 year ?2. Followed by?lumpectomy and axillary node dissection completed on April 09, 2021 consistent with complete response ?3. Followed by adjuvant radiation therapy if patient had lumpectomy? ?4.??Followed by antiestrogen therapy ?__________________________________________________________________________________ ?Current treatment:?Herceptin/Perjeta (to be completed end of September 2023) patient wants to discontinue treatment because of financial toxicity. ?  ?Echo from 05/15/2021 reviewed, EF 60-65% ?? ?Return to clinic based upon availability of financial assistance.  She is going to find out if her radiation therapy will be covered 100% by her insurance ?

## 2021-06-20 ENCOUNTER — Telehealth: Payer: Self-pay | Admitting: *Deleted

## 2021-06-20 ENCOUNTER — Encounter: Payer: Self-pay | Admitting: *Deleted

## 2021-06-20 NOTE — Telephone Encounter (Signed)
Left vm regarding appt with Dr. Lynnette Caffey to discuss xrt at [facility]. Contact information provided. ? ?Called [facility] to see if pt returned there call to r/s. Informed they called pt. Pt declines xrt and does not want to schedule a consult. Physician team notified. ?

## 2021-07-02 ENCOUNTER — Ambulatory Visit: Payer: Medicare HMO | Attending: Hematology and Oncology

## 2021-07-02 VITALS — Wt 142.5 lb

## 2021-07-02 DIAGNOSIS — Z483 Aftercare following surgery for neoplasm: Secondary | ICD-10-CM | POA: Insufficient documentation

## 2021-07-02 NOTE — Therapy (Signed)
  OUTPATIENT PHYSICAL THERAPY SOZO SCREENING NOTE   Patient Name: Lisa Chapman MRN: [id number] DOB:05-Jun-1945, 76 y.o., female Today's Date: 07/02/2021  PCP: [name], MD REFERRING PROVIDER: Nicholas Lose, MD   PT End of Session - 07/02/21 1025     Visit Number 1   # unchanged due to screen only   PT Start Time 1023    PT Stop Time 1027    PT Time Calculation (min) 4 min    Activity Tolerance Patient tolerated treatment well    Behavior During Therapy [facility] for tasks assessed/performed             Past Medical History:  Diagnosis Date   Breast cancer ([place])    Cancer ([place]) 2022   Breast Cancer   Diverticulosis    Dysrhythmia    Heart murmur    History of colonic polyps    History of DVT (deep vein thrombosis)    Right leg, April 2015   History of shingles    Mixed hyperlipidemia    Personal history of chemotherapy    Past Surgical History:  Procedure Laterality Date   BREAST LUMPECTOMY Right 04/09/2021   BREAST LUMPECTOMY WITH RADIOACTIVE SEED LOCALIZATION Right 04/09/2021   Procedure: RIGHT BREAST LUMPECTOMY WITH RADIOACTIVE SEED LOCALIZATION;  Surgeon: Coralie Keens, MD;  Location: [facility];  Service: General;  Laterality: Right;   HERNIA REPAIR     MENISCUS REPAIR Left 03/2019   PORTACATH PLACEMENT Left 11/03/2020   Procedure: INSERTION PORT-A-CATH;  Surgeon: Coralie Keens, MD;  Location: [facility];  Service: General;  Laterality: Left;   RADIOACTIVE SEED GUIDED AXILLARY SENTINEL LYMPH NODE Right 04/09/2021   Procedure: RADIOACTIVE SEED GUIDED RIGHT AXILLARY SENTINEL LYMPH NODE DISSECTION;  Surgeon: Coralie Keens, MD;  Location: [facility];  Service: General;  Laterality: Right;   TONSILLECTOMY     Patient Active Problem List   Diagnosis Date Noted   Port-A-Cath in place 11/17/2020   Malignant neoplasm of upper-outer quadrant of right breast in female, estrogen receptor positive ([place]) 10/31/2020   Recurrent umbilical  hernia 88/12/313   DVT (deep venous thrombosis) ([place]) 03/22/2019   Paroxysmal supraventricular tachycardia ([place]) 03/08/2014   Cardiac murmur 03/08/2014   Mixed hyperlipidemia 03/08/2014    REFERRING DIAG: right breast cancer at risk for lymphedema  THERAPY DIAG:  Aftercare following surgery for neoplasm  PERTINENT HISTORY: R breast cancer with mets to axillary lymph node, currently undergoing neoadjuvant chemo, plan is to undergo a R breast lumpectomy and ALND 04/09/21   PRECAUTIONS: right UE Lymphedema risk, None  SUBJECTIVE: Pt returns for her 3 month L-Dex screen.   PAIN:  Are you having pain? No  SOZO SCREENING: Patient was assessed today using the SOZO machine to determine the lymphedema index score. This was compared to her baseline score. It was determined that she is within the recommended range when compared to her baseline and no further action is needed at this time. She will continue SOZO screenings. These are done every 3 months for 2 years post operatively followed by every 6 months for 2 years, and then annually.    Otelia Limes, PTA 07/02/2021, 10:27 AM

## 2021-07-05 ENCOUNTER — Other Ambulatory Visit: Payer: Self-pay

## 2021-07-05 ENCOUNTER — Inpatient Hospital Stay: Payer: Medicare HMO

## 2021-07-05 VITALS — BP 152/76 | HR 62 | Temp 97.8°F | Resp 17 | Wt 141.5 lb

## 2021-07-05 DIAGNOSIS — C50411 Malignant neoplasm of upper-outer quadrant of right female breast: Secondary | ICD-10-CM | POA: Diagnosis not present

## 2021-07-05 DIAGNOSIS — Z5112 Encounter for antineoplastic immunotherapy: Secondary | ICD-10-CM | POA: Diagnosis not present

## 2021-07-05 DIAGNOSIS — Z170 Estrogen receptor positive status [ER+]: Secondary | ICD-10-CM | POA: Diagnosis not present

## 2021-07-05 MED ORDER — SODIUM CHLORIDE 0.9% FLUSH
10.0000 mL | INTRAVENOUS | Status: DC | PRN
  Administered 2021-07-05: 10 mL

## 2021-07-05 MED ORDER — DIPHENHYDRAMINE HCL 25 MG PO CAPS
25.0000 mg | ORAL_CAPSULE | Freq: Once | ORAL | Status: AC
  Administered 2021-07-05: 25 mg via ORAL
  Filled 2021-07-05: qty 1

## 2021-07-05 MED ORDER — SODIUM CHLORIDE 0.9 % IV SOLN: Freq: Once | INTRAVENOUS | Status: AC

## 2021-07-05 MED ORDER — ACETAMINOPHEN 325 MG PO TABS
650.0000 mg | ORAL_TABLET | Freq: Once | ORAL | Status: AC
  Administered 2021-07-05: 650 mg via ORAL
  Filled 2021-07-05: qty 2

## 2021-07-05 MED ORDER — TRASTUZUMAB-ANNS CHEMO 150 MG IV SOLR
6.0000 mg/kg | Freq: Once | INTRAVENOUS | Status: AC
  Administered 2021-07-05: 420 mg via INTRAVENOUS
  Filled 2021-07-05: qty 20

## 2021-07-05 MED ORDER — SODIUM CHLORIDE 0.9 % IV SOLN
420.0000 mg | Freq: Once | INTRAVENOUS | Status: AC
  Administered 2021-07-05: 420 mg via INTRAVENOUS
  Filled 2021-07-05: qty 14

## 2021-07-05 MED ORDER — HEPARIN SOD (PORK) LOCK FLUSH 100 UNIT/ML IV SOLN
500.0000 [IU] | Freq: Once | INTRAVENOUS | Status: AC | PRN
  Administered 2021-07-05: 500 [IU]

## 2021-07-05 NOTE — Patient Instructions (Signed)
[facility]  Discharge Instructions: Thank you for choosing [facility] to provide your oncology and hematology care.   If you have a lab appointment with the Cancer Center, please go directly to the Cancer Center and check in at the registration area.   Wear comfortable clothing and clothing appropriate for easy access to any Portacath or PICC line.   We strive to give you quality time with your provider. You may need to reschedule your appointment if you arrive late (15 or more minutes).  Arriving late affects you and other patients whose appointments are after yours.  Also, if you miss three or more appointments without notifying the office, you may be dismissed from the clinic at the provider's discretion.      For prescription refill requests, have your pharmacy contact our office and allow 72 hours for refills to be completed.    Today you received the following chemotherapy and/or immunotherapy agents: Kanjinti, Perjeta      To help prevent nausea and vomiting after your treatment, we encourage you to take your nausea medication as directed.  BELOW ARE SYMPTOMS THAT SHOULD BE REPORTED IMMEDIATELY: *FEVER GREATER THAN 100.4 F (38 C) OR HIGHER *CHILLS OR SWEATING *NAUSEA AND VOMITING THAT IS NOT CONTROLLED WITH YOUR NAUSEA MEDICATION *UNUSUAL SHORTNESS OF BREATH *UNUSUAL BRUISING OR BLEEDING *URINARY PROBLEMS (pain or burning when urinating, or frequent urination) *BOWEL PROBLEMS (unusual diarrhea, constipation, pain near the anus) TENDERNESS IN MOUTH AND THROAT WITH OR WITHOUT PRESENCE OF ULCERS (sore throat, sores in mouth, or a toothache) UNUSUAL RASH, SWELLING OR PAIN  UNUSUAL VAGINAL DISCHARGE OR ITCHING   Items with * indicate a potential emergency and should be followed up as soon as possible or go to the Emergency Department if any problems should occur.  Please show the CHEMOTHERAPY ALERT CARD or IMMUNOTHERAPY ALERT CARD at  check-in to the Emergency Department and triage nurse.  Should you have questions after your visit or need to cancel or reschedule your appointment, please contact [facility]  Dept: [phone]  and follow the prompts.  Office hours are 8:00 a.m. to 4:30 p.m. Monday - Friday. Please note that voicemails left after 4:00 p.m. may not be returned until the following business day.  We are closed weekends and major holidays. You have access to a nurse at all times for urgent questions. Please call the main number to the clinic Dept: [phone] and follow the prompts.   For any non-urgent questions, you may also contact your provider using MyChart. We now offer e-Visits for anyone 18 and older to request care online for non-urgent symptoms. For details visit [email or web address].   Also download the MyChart app! Go to the app store, search "MyChart", open the app, select [facility], and log in with your MyChart username and password.  Due to Covid, a mask is required upon entering the hospital/clinic. If you do not have a mask, one will be given to you upon arrival. For doctor visits, patients may have 1 support person aged 18 or older with them. For treatment visits, patients cannot have anyone with them due to current Covid guidelines and our immunocompromised population.  

## 2021-07-26 ENCOUNTER — Other Ambulatory Visit: Payer: Self-pay | Admitting: Hematology and Oncology

## 2021-07-26 ENCOUNTER — Inpatient Hospital Stay: Payer: Medicare HMO

## 2021-07-26 ENCOUNTER — Other Ambulatory Visit: Payer: Self-pay

## 2021-07-26 ENCOUNTER — Inpatient Hospital Stay: Payer: Medicare HMO | Attending: Hematology and Oncology | Admitting: Adult Health

## 2021-07-26 ENCOUNTER — Encounter: Payer: Self-pay | Admitting: Adult Health

## 2021-07-26 VITALS — BP 155/84 | HR 68 | Temp 97.3°F | Resp 16 | Ht 60.0 in | Wt 142.4 lb

## 2021-07-26 DIAGNOSIS — Z170 Estrogen receptor positive status [ER+]: Secondary | ICD-10-CM

## 2021-07-26 DIAGNOSIS — Z95828 Presence of other vascular implants and grafts: Secondary | ICD-10-CM

## 2021-07-26 DIAGNOSIS — C50411 Malignant neoplasm of upper-outer quadrant of right female breast: Secondary | ICD-10-CM | POA: Insufficient documentation

## 2021-07-26 DIAGNOSIS — Z79811 Long term (current) use of aromatase inhibitors: Secondary | ICD-10-CM | POA: Diagnosis not present

## 2021-07-26 DIAGNOSIS — Z5112 Encounter for antineoplastic immunotherapy: Secondary | ICD-10-CM | POA: Insufficient documentation

## 2021-07-26 LAB — CBC WITH DIFFERENTIAL (CANCER CENTER ONLY)
Abs Immature Granulocytes: 0.01 10*3/uL (ref 0.00–0.07)
Basophils Absolute: 0 10*3/uL (ref 0.0–0.1)
Basophils Relative: 0 %
Eosinophils Absolute: 0.1 10*3/uL (ref 0.0–0.5)
Eosinophils Relative: 1 %
HCT: 36 % (ref 36.0–46.0)
Hemoglobin: 11.7 g/dL — ABNORMAL LOW (ref 12.0–15.0)
Immature Granulocytes: 0 %
Lymphocytes Relative: 29 %
Lymphs Abs: 1.6 10*3/uL (ref 0.7–4.0)
MCH: 27.2 pg (ref 26.0–34.0)
MCHC: 32.5 g/dL (ref 30.0–36.0)
MCV: 83.7 fL (ref 80.0–100.0)
Monocytes Absolute: 0.4 10*3/uL (ref 0.1–1.0)
Monocytes Relative: 8 %
Neutro Abs: 3.4 10*3/uL (ref 1.7–7.7)
Neutrophils Relative %: 62 %
Platelet Count: 233 10*3/uL (ref 150–400)
RBC: 4.3 MIL/uL (ref 3.87–5.11)
RDW: 16.1 % — ABNORMAL HIGH (ref 11.5–15.5)
WBC Count: 5.6 10*3/uL (ref 4.0–10.5)
nRBC: 0 % (ref 0.0–0.2)

## 2021-07-26 LAB — CMP (CANCER CENTER ONLY)
ALT: 9 U/L (ref 0–44)
AST: 16 U/L (ref 15–41)
Albumin: 4.2 g/dL (ref 3.5–5.0)
Alkaline Phosphatase: 68 U/L (ref 38–126)
Anion gap: 6 (ref 5–15)
BUN: 13 mg/dL (ref 8–23)
CO2: 28 mmol/L (ref 22–32)
Calcium: 9.7 mg/dL (ref 8.9–10.3)
Chloride: 107 mmol/L (ref 98–111)
Creatinine: 0.76 mg/dL (ref 0.44–1.00)
GFR, Estimated: >60 mL/min (ref >60)
Glucose, Bld: 98 mg/dL (ref 70–99)
Potassium: 3.9 mmol/L (ref 3.5–5.1)
Sodium: 141 mmol/L (ref 135–145)
Total Bilirubin: 0.4 mg/dL (ref 0.3–1.2)
Total Protein: 7.8 g/dL (ref 6.5–8.1)

## 2021-07-26 MED ORDER — SODIUM CHLORIDE 0.9 % IV SOLN: Freq: Once | INTRAVENOUS | Status: AC

## 2021-07-26 MED ORDER — ACETAMINOPHEN 325 MG PO TABS
650.0000 mg | ORAL_TABLET | Freq: Once | ORAL | Status: AC
  Administered 2021-07-26: 650 mg via ORAL
  Filled 2021-07-26: qty 2

## 2021-07-26 MED ORDER — HEPARIN SOD (PORK) LOCK FLUSH 100 UNIT/ML IV SOLN
500.0000 [IU] | Freq: Once | INTRAVENOUS | Status: AC | PRN
  Administered 2021-07-26: 500 [IU]

## 2021-07-26 MED ORDER — SODIUM CHLORIDE 0.9% FLUSH
10.0000 mL | Freq: Once | INTRAVENOUS | Status: AC
  Administered 2021-07-26: 10 mL

## 2021-07-26 MED ORDER — SODIUM CHLORIDE 0.9 % IV SOLN
420.0000 mg | Freq: Once | INTRAVENOUS | Status: AC
  Administered 2021-07-26: 420 mg via INTRAVENOUS
  Filled 2021-07-26: qty 14

## 2021-07-26 MED ORDER — TRASTUZUMAB-ANNS CHEMO 150 MG IV SOLR
6.0000 mg/kg | Freq: Once | INTRAVENOUS | Status: AC
  Administered 2021-07-26: 378 mg via INTRAVENOUS
  Filled 2021-07-26: qty 18

## 2021-07-26 MED ORDER — DIPHENHYDRAMINE HCL 25 MG PO CAPS
25.0000 mg | ORAL_CAPSULE | Freq: Once | ORAL | Status: AC
  Administered 2021-07-26: 25 mg via ORAL
  Filled 2021-07-26: qty 1

## 2021-07-26 MED ORDER — SODIUM CHLORIDE 0.9% FLUSH
10.0000 mL | INTRAVENOUS | Status: DC | PRN
  Administered 2021-07-26: 10 mL

## 2021-07-26 NOTE — Assessment & Plan Note (Signed)
Lisa Chapman is here today for follow-up of her breast cancer.  She is status post neoadjuvant chemotherapy, right lumpectomy, declined adjuvant radiation therapy and began letrozole daily on Jun 14, 2021.  She is here today for Herceptin Perjeta.  Her most recent echo was completed in April and was normal.  She will proceed with treatment today.  She is tolerating this well.  She is also taking letrozole and has no significant issues with taking this.  We are reaching out to her primary care provider to identify when her most recent bone density test was completed.  Lisa Chapman has no clinical sign of breast cancer recurrence.  She is tolerating her treatment well.  I placed orders for echocardiogram to be completed next month.  Lisa Chapman will continue to return every 3 weeks for Herceptin and Perjeta and we will see her with every other cycle.

## 2021-07-26 NOTE — Progress Notes (Signed)
[facility] Follow up:    Lisa Chroman, MD [address] [place] [address]   DIAGNOSIS:  Cancer Staging  Malignant neoplasm of upper-outer quadrant of right breast in female, estrogen receptor positive ([place]) Staging form: Breast, AJCC 8th Edition - Clinical stage from 10/31/2020: Stage IIA (cT2, cN1, cM0, G2, ER+, PR-, HER2+) - Signed by Nicholas Lose, MD on 10/31/2020 Stage prefix: Initial diagnosis Histologic grading system: 3 grade system - Pathologic stage from 04/09/2021: No Stage Recommended (ypT0, pN0, cM0) - Signed by Gardenia Phlegm, NP on 07/26/2021 Stage prefix: Post-therapy   SUMMARY OF ONCOLOGIC HISTORY: Oncology History  Malignant neoplasm of upper-outer quadrant of right breast in female, estrogen receptor positive ([place])  10/04/2020 Initial Diagnosis   Palpable lump in the right breast, mammogram detected 3.6 cm mass with several axillary lymph nodes, biopsy revealed grade 1-2 IDC, lymph nodes positive for cancer, ER 40%, PR 0%, Ki-67 15%, HER2 positive 3+ by [facility]   10/31/2020 Cancer Staging   Staging form: Breast, AJCC 8th Edition - Clinical stage from 10/31/2020: Stage IIA (cT2, cN1, cM0, G2, ER+, PR-, HER2+) - Signed by Nicholas Lose, MD on 10/31/2020 Stage prefix: Initial diagnosis Histologic grading system: 3 grade system   11/17/2020 - 03/01/2021 Chemotherapy   Patient is on Treatment Plan : BREAST  Docetaxel + Carboplatin + Trastuzumab + Pertuzumab  (TCHP) q21d  x 6     03/23/2021 -  Chemotherapy   Herceptin/Perjeta to complete one year of immunotherapy    04/09/2021 Surgery   Right lumpectomy and axillary node dissection: no residual cancer, 18 lymph nodes negative for cancer. ypT0,N0   04/09/2021 Cancer Staging   Staging form: Breast, AJCC 8th Edition - Pathologic stage from 04/09/2021: No Stage Recommended (ypT0, pN0, cM0) - Signed by Gardenia Phlegm, NP on 07/26/2021 Stage prefix: Post-therapy   05/2021 -  Radiation Therapy    Declined adjuvant radiation therapy    06/14/2021 -  Anti-estrogen oral therapy   Letrozole daily     CURRENT THERAPY: Herceptin Perjeta, letrozole  INTERVAL HISTORY: Lisa Chapman 76 y.o. female returns for follow-up prior to receiving Herceptin Perjeta therapy.  Lisa Chapman most recent echocardiogram was completed on May 15, 2021 and showed a left ventricular ejection fraction of 60 to 65%.  Lisa Chapman also had a normal global longitudinal strain.  Lisa Chapman tells me Lisa Chapman is tolerating the Herceptin and Perjeta well without any difficulty.  [place] is also taking letrozole daily.  Lisa Chapman tolerates this well with no concerns.  Lisa Chapman started on this Jun 14, 2021.  Lisa Chapman most recent bone density test was conducted with Lisa Chapman primary care provider.  Lisa Chapman thinks it has been within the past 1 to 2 years.   Patient Active Problem List   Diagnosis Date Noted   Port-A-Cath in place 11/17/2020   Malignant neoplasm of upper-outer quadrant of right breast in female, estrogen receptor positive ([place]) 10/31/2020   Recurrent umbilical hernia 46/56/8127   DVT (deep venous thrombosis) ([place]) 03/22/2019   Paroxysmal supraventricular tachycardia ([place]) 03/08/2014   Cardiac murmur 03/08/2014   Mixed hyperlipidemia 03/08/2014    is allergic to other and boniva [ibandronic acid].  MEDICAL HISTORY: Past Medical History:  Diagnosis Date   Breast cancer ([place])    Cancer ([place]) 2022   Breast Cancer   Diverticulosis    Dysrhythmia    Heart murmur    History of colonic polyps    History of DVT (deep vein thrombosis)    Right  leg, April 2015   History of shingles    Mixed hyperlipidemia    Personal history of chemotherapy     SURGICAL HISTORY: Past Surgical History:  Procedure Laterality Date   BREAST LUMPECTOMY Right 04/09/2021   BREAST LUMPECTOMY WITH RADIOACTIVE SEED LOCALIZATION Right 04/09/2021   Procedure: RIGHT BREAST LUMPECTOMY WITH RADIOACTIVE SEED LOCALIZATION;  Surgeon: Coralie Keens, MD;  Location: [facility];  Service: General;  Laterality: Right;   HERNIA REPAIR     MENISCUS REPAIR Left 03/2019   PORTACATH PLACEMENT Left 11/03/2020   Procedure: INSERTION PORT-A-CATH;  Surgeon: Coralie Keens, MD;  Location: [facility];  Service: General;  Laterality: Left;   RADIOACTIVE SEED GUIDED AXILLARY SENTINEL LYMPH NODE Right 04/09/2021   Procedure: RADIOACTIVE SEED GUIDED RIGHT AXILLARY SENTINEL LYMPH NODE DISSECTION;  Surgeon: Coralie Keens, MD;  Location: [facility];  Service: General;  Laterality: Right;   TONSILLECTOMY      SOCIAL HISTORY: Social History   Socioeconomic History   Marital status: Married    Spouse name: Not on file   Number of children: Not on file   Years of education: Not on file   Highest education level: Not on file  Occupational History   Not on file  Tobacco Use   Smoking status: Never   Smokeless tobacco: Never  Vaping Use   Vaping Use: Never used  Substance and Sexual Activity   Alcohol use: No    Alcohol/week: 0.0 standard drinks of alcohol   Drug use: No   Sexual activity: Not on file  Other Topics Concern   Not on file  Social History Narrative   Married. No children   Social Determinants of Radio broadcast assistant Strain: Not on file  Food Insecurity: Not on file  Transportation Needs: Not on file  Physical Activity: Not on file  Stress: Not on file  Social Connections: Not on file  Intimate Partner Violence: Not on file    FAMILY HISTORY: Family History  Problem Relation Age of Onset   Heart attack Mother    COPD Father    Heart attack Father    Emphysema Father     Review of Systems  Constitutional:  Negative for appetite change, chills, fatigue, fever and unexpected weight change.  HENT:   Negative for hearing loss, lump/mass and trouble swallowing.   Eyes:  Negative for eye problems and icterus.  Respiratory:  Negative for chest tightness, cough and shortness of breath.   Cardiovascular:  Negative  for chest pain, leg swelling and palpitations.  Gastrointestinal:  Negative for abdominal distention, abdominal pain, constipation, diarrhea, nausea and vomiting.  Endocrine: Negative for hot flashes.  Genitourinary:  Negative for difficulty urinating.   Musculoskeletal:  Negative for arthralgias.  Skin:  Negative for itching and rash.  Neurological:  Negative for dizziness, extremity weakness, headaches and numbness.  Hematological:  Negative for adenopathy. Does not bruise/bleed easily.  Psychiatric/Behavioral:  Negative for depression. The patient is not nervous/anxious.       PHYSICAL EXAMINATION  ECOG PERFORMANCE STATUS: 0  Vitals:   07/26/21 0912  BP: (!) 155/84  Pulse: 68  Resp: 16  Temp: (!) 97.3 F (36.3 C)  SpO2: 96%    Physical Exam Constitutional:      General: Lisa Chapman is not in acute distress.    Appearance: Normal appearance. Lisa Chapman is not toxic-appearing.  HENT:     Head: Normocephalic and atraumatic.  Eyes:     General: No scleral icterus.  Cardiovascular:     Rate and Rhythm: Normal rate and regular rhythm.     Pulses: Normal pulses.     Heart sounds: Normal heart sounds.  Pulmonary:     Effort: Pulmonary effort is normal.     Breath sounds: Normal breath sounds.  Abdominal:     General: Abdomen is flat. Bowel sounds are normal. There is no distension.     Palpations: Abdomen is soft.     Tenderness: There is no abdominal tenderness.  Musculoskeletal:        General: No swelling.     Cervical back: Neck supple.  Lymphadenopathy:     Cervical: No cervical adenopathy.  Skin:    General: Skin is warm and dry.     Findings: No rash.  Neurological:     General: No focal deficit present.     Mental Status: Lisa Chapman is alert.  Psychiatric:        Mood and Affect: Mood normal.        Behavior: Behavior normal.     LABORATORY DATA:  CBC    Component Value Date/Time   WBC 5.6 07/26/2021 0856   WBC 6.6 10/27/2020 1023   RBC 4.30 07/26/2021 0856   HGB  11.7 (L) 07/26/2021 0856   HCT 36.0 07/26/2021 0856   PLT 233 07/26/2021 0856   MCV 83.7 07/26/2021 0856   MCH 27.2 07/26/2021 0856   MCHC 32.5 07/26/2021 0856   RDW 16.1 (H) 07/26/2021 0856   LYMPHSABS 1.6 07/26/2021 0856   MONOABS 0.4 07/26/2021 0856   EOSABS 0.1 07/26/2021 0856   BASOSABS 0.0 07/26/2021 0856    CMP     Component Value Date/Time   NA 141 07/26/2021 0856   K 3.9 07/26/2021 0856   CL 107 07/26/2021 0856   CO2 28 07/26/2021 0856   GLUCOSE 98 07/26/2021 0856   BUN 13 07/26/2021 0856   CREATININE 0.76 07/26/2021 0856   CALCIUM 9.7 07/26/2021 0856   PROT 7.8 07/26/2021 0856   ALBUMIN 4.2 07/26/2021 0856   AST 16 07/26/2021 0856   ALT 9 07/26/2021 0856   ALKPHOS 68 07/26/2021 0856   BILITOT 0.4 07/26/2021 0856   GFRNONAA >60 07/26/2021 0856      ASSESSMENT and THERAPY PLAN:   Malignant neoplasm of upper-outer quadrant of right breast in female, estrogen receptor positive ([place]) Lisa Chapman is here today for follow-up of Lisa Chapman breast cancer.  Lisa Chapman is status post neoadjuvant chemotherapy, right lumpectomy, declined adjuvant radiation therapy and began letrozole daily on Jun 14, 2021.  Lisa Chapman is here today for Herceptin Perjeta.  Lisa Chapman most recent echo was completed in April and was normal.  Lisa Chapman will proceed with treatment today.  Lisa Chapman is tolerating this well.  Lisa Chapman is also taking letrozole and has no significant issues with taking this.  We are reaching out to Lisa Chapman primary care provider to identify when Lisa Chapman most recent bone density test was completed.  [place] has no clinical sign of breast cancer recurrence.  Lisa Chapman is tolerating Lisa Chapman treatment well.  I placed orders for echocardiogram to be completed next month.  Tariah will continue to return every 3 weeks for Herceptin and Perjeta and we will see Lisa Chapman with every other cycle.     All questions were answered. The patient knows to call the clinic with any problems, questions or concerns. We can certainly see the patient much sooner  if necessary.  Total encounter time:20 minutes*in face-to-face visit time, chart review, lab review, care coordination, order  entry, and documentation of the encounter time.    Wilber Bihari, NP 07/26/21 11:15 AM Medical Oncology and Hematology [facility] [place], [place] [address] Tel. [phone]    Fax. [phone]  *Total Encounter Time as defined by the Centers for Medicare and Medicaid Services includes, in addition to the face-to-face time of a patient visit (documented in the note above) non-face-to-face time: obtaining and reviewing outside history, ordering and reviewing medications, tests or procedures, care coordination (communications with other health care professionals or caregivers) and documentation in the medical record.

## 2021-08-10 ENCOUNTER — Ambulatory Visit (HOSPITAL_COMMUNITY)
Admission: RE | Admit: 2021-08-10 | Discharge: 2021-08-10 | Disposition: A | Discharge status: Home / Self Care | Payer: Medicare HMO | Source: Ambulatory Visit | Attending: Adult Health | Admitting: Adult Health

## 2021-08-10 DIAGNOSIS — C50411 Malignant neoplasm of upper-outer quadrant of right female breast: Secondary | ICD-10-CM | POA: Insufficient documentation

## 2021-08-10 DIAGNOSIS — Z170 Estrogen receptor positive status [ER+]: Secondary | ICD-10-CM | POA: Insufficient documentation

## 2021-08-10 DIAGNOSIS — Z0189 Encounter for other specified special examinations: Secondary | ICD-10-CM

## 2021-08-10 DIAGNOSIS — I340 Nonrheumatic mitral (valve) insufficiency: Secondary | ICD-10-CM | POA: Diagnosis not present

## 2021-08-10 DIAGNOSIS — Z01818 Encounter for other preprocedural examination: Secondary | ICD-10-CM | POA: Insufficient documentation

## 2021-08-10 LAB — ECHOCARDIOGRAM COMPLETE
AR max vel: 1.64 cm2
AV Area VTI: 1.72 cm2
AV Area mean vel: 1.62 cm2
AV Mean grad: 5 mmHg
AV Peak grad: 9.1 mmHg
Ao pk vel: 1.51 m/s
Area-P 1/2: 3.68 cm2
MV M vel: 5.09 m/s
MV Peak grad: 103.6 mmHg
S' Lateral: 2.95 cm

## 2021-08-10 NOTE — Progress Notes (Signed)
Echocardiogram 2D Echocardiogram has been performed.  Lisa Chapman 08/10/2021, 9:37 AM

## 2021-08-16 ENCOUNTER — Inpatient Hospital Stay: Payer: Medicare HMO

## 2021-08-16 ENCOUNTER — Inpatient Hospital Stay: Payer: Medicare HMO | Attending: Hematology and Oncology

## 2021-08-16 ENCOUNTER — Other Ambulatory Visit: Payer: Self-pay

## 2021-08-16 ENCOUNTER — Other Ambulatory Visit: Payer: Self-pay | Admitting: Hematology and Oncology

## 2021-08-16 VITALS — BP 143/88 | HR 68 | Temp 98.0°F | Resp 16 | Wt 143.5 lb

## 2021-08-16 DIAGNOSIS — Z170 Estrogen receptor positive status [ER+]: Secondary | ICD-10-CM | POA: Diagnosis not present

## 2021-08-16 DIAGNOSIS — Z5112 Encounter for antineoplastic immunotherapy: Secondary | ICD-10-CM | POA: Diagnosis not present

## 2021-08-16 DIAGNOSIS — Z452 Encounter for adjustment and management of vascular access device: Secondary | ICD-10-CM | POA: Diagnosis not present

## 2021-08-16 DIAGNOSIS — Z79811 Long term (current) use of aromatase inhibitors: Secondary | ICD-10-CM | POA: Insufficient documentation

## 2021-08-16 DIAGNOSIS — Z95828 Presence of other vascular implants and grafts: Secondary | ICD-10-CM

## 2021-08-16 DIAGNOSIS — C50411 Malignant neoplasm of upper-outer quadrant of right female breast: Secondary | ICD-10-CM | POA: Diagnosis present

## 2021-08-16 LAB — CMP (CANCER CENTER ONLY)
ALT: 8 U/L (ref 0–44)
AST: 16 U/L (ref 15–41)
Albumin: 4.1 g/dL (ref 3.5–5.0)
Alkaline Phosphatase: 65 U/L (ref 38–126)
Anion gap: 8 (ref 5–15)
BUN: 12 mg/dL (ref 8–23)
CO2: 26 mmol/L (ref 22–32)
Calcium: 9.6 mg/dL (ref 8.9–10.3)
Chloride: 106 mmol/L (ref 98–111)
Creatinine: 0.81 mg/dL (ref 0.44–1.00)
GFR, Estimated: >60 mL/min (ref >60)
Glucose, Bld: 97 mg/dL (ref 70–99)
Potassium: 3.9 mmol/L (ref 3.5–5.1)
Sodium: 140 mmol/L (ref 135–145)
Total Bilirubin: 0.5 mg/dL (ref 0.3–1.2)
Total Protein: 7.8 g/dL (ref 6.5–8.1)

## 2021-08-16 LAB — CBC WITH DIFFERENTIAL (CANCER CENTER ONLY)
Abs Immature Granulocytes: 0.02 10*3/uL (ref 0.00–0.07)
Basophils Absolute: 0 10*3/uL (ref 0.0–0.1)
Basophils Relative: 0 %
Eosinophils Absolute: 0.1 10*3/uL (ref 0.0–0.5)
Eosinophils Relative: 2 %
HCT: 36.2 % (ref 36.0–46.0)
Hemoglobin: 11.7 g/dL — ABNORMAL LOW (ref 12.0–15.0)
Immature Granulocytes: 0 %
Lymphocytes Relative: 33 %
Lymphs Abs: 2.1 10*3/uL (ref 0.7–4.0)
MCH: 27.2 pg (ref 26.0–34.0)
MCHC: 32.3 g/dL (ref 30.0–36.0)
MCV: 84.2 fL (ref 80.0–100.0)
Monocytes Absolute: 0.7 10*3/uL (ref 0.1–1.0)
Monocytes Relative: 11 %
Neutro Abs: 3.5 10*3/uL (ref 1.7–7.7)
Neutrophils Relative %: 54 %
Platelet Count: 220 10*3/uL (ref 150–400)
RBC: 4.3 MIL/uL (ref 3.87–5.11)
RDW: 16.5 % — ABNORMAL HIGH (ref 11.5–15.5)
WBC Count: 6.5 10*3/uL (ref 4.0–10.5)
nRBC: 0 % (ref 0.0–0.2)

## 2021-08-16 MED ORDER — ACETAMINOPHEN 325 MG PO TABS
650.0000 mg | ORAL_TABLET | Freq: Once | ORAL | Status: AC
  Administered 2021-08-16: 650 mg via ORAL
  Filled 2021-08-16: qty 2

## 2021-08-16 MED ORDER — ALTEPLASE 2 MG IJ SOLR
2.0000 mg | Freq: Once | INTRAMUSCULAR | Status: AC
  Administered 2021-08-16: 2 mg
  Filled 2021-08-16: qty 2

## 2021-08-16 MED ORDER — SODIUM CHLORIDE 0.9 % IV SOLN: Freq: Once | INTRAVENOUS | Status: AC

## 2021-08-16 MED ORDER — SODIUM CHLORIDE 0.9% FLUSH
10.0000 mL | Freq: Once | INTRAVENOUS | Status: AC
  Administered 2021-08-16: 10 mL

## 2021-08-16 MED ORDER — TRASTUZUMAB-ANNS CHEMO 150 MG IV SOLR
6.0000 mg/kg | Freq: Once | INTRAVENOUS | Status: AC
  Administered 2021-08-16: 378 mg via INTRAVENOUS
  Filled 2021-08-16: qty 18

## 2021-08-16 MED ORDER — SODIUM CHLORIDE 0.9% FLUSH
10.0000 mL | INTRAVENOUS | Status: DC | PRN
  Administered 2021-08-16: 10 mL

## 2021-08-16 MED ORDER — HEPARIN SOD (PORK) LOCK FLUSH 100 UNIT/ML IV SOLN
500.0000 [IU] | Freq: Once | INTRAVENOUS | Status: AC | PRN
  Administered 2021-08-16: 500 [IU]

## 2021-08-16 MED ORDER — SODIUM CHLORIDE 0.9 % IV SOLN
420.0000 mg | Freq: Once | INTRAVENOUS | Status: AC
  Administered 2021-08-16: 420 mg via INTRAVENOUS
  Filled 2021-08-16: qty 14

## 2021-08-16 MED ORDER — DIPHENHYDRAMINE HCL 25 MG PO CAPS
25.0000 mg | ORAL_CAPSULE | Freq: Once | ORAL | Status: AC
  Administered 2021-08-16: 25 mg via ORAL
  Filled 2021-08-16: qty 1

## 2021-08-16 NOTE — Patient Instructions (Signed)
[facility]   Discharge Instructions: Thank you for choosing [facility] to provide your oncology and hematology care.   If you have a lab appointment with the [facility], please go directly to the [facility] and check in at the registration area.   Wear comfortable clothing and clothing appropriate for easy access to any Portacath or PICC line.   We strive to give you quality time with your provider. You may need to reschedule your appointment if you arrive late (15 or more minutes).  Arriving late affects you and other patients whose appointments are after yours.  Also, if you miss three or more appointments without notifying the office, you may be dismissed from the clinic at the provider's discretion.      For prescription refill requests, have your pharmacy contact our office and allow 72 hours for refills to be completed.    Today you received the following chemotherapy and/or immunotherapy agents: trastuzumab and pertuzumab      To help prevent nausea and vomiting after your treatment, we encourage you to take your nausea medication as directed.  BELOW ARE SYMPTOMS THAT SHOULD BE REPORTED IMMEDIATELY: *FEVER GREATER THAN 100.4 F (38 C) OR HIGHER *CHILLS OR SWEATING *NAUSEA AND VOMITING THAT IS NOT CONTROLLED WITH YOUR NAUSEA MEDICATION *UNUSUAL SHORTNESS OF BREATH *UNUSUAL BRUISING OR BLEEDING *URINARY PROBLEMS (pain or burning when urinating, or frequent urination) *BOWEL PROBLEMS (unusual diarrhea, constipation, pain near the anus) TENDERNESS IN MOUTH AND THROAT WITH OR WITHOUT PRESENCE OF ULCERS (sore throat, sores in mouth, or a toothache) UNUSUAL RASH, SWELLING OR PAIN  UNUSUAL VAGINAL DISCHARGE OR ITCHING   Items with * indicate a potential emergency and should be followed up as soon as possible or go to the Emergency Department if any problems should occur.  Please show the CHEMOTHERAPY ALERT CARD or IMMUNOTHERAPY ALERT  CARD at check-in to the Emergency Department and triage nurse.  Should you have questions after your visit or need to cancel or reschedule your appointment, please contact [facility]  Dept: [phone]  and follow the prompts.  Office hours are 8:00 a.m. to 4:30 p.m. Monday - Friday. Please note that voicemails left after 4:00 p.m. may not be returned until the following business day.  We are closed weekends and major holidays. You have access to a nurse at all times for urgent questions. Please call the main number to the clinic Dept: [phone] and follow the prompts.   For any non-urgent questions, you may also contact your provider using MyChart. We now offer e-Visits for anyone 72 and older to request care online for non-urgent symptoms. For details visit [email or web address].   Also download the MyChart app! Go to the app store, search "MyChart", open the app, select [facility], and log in with your MyChart username and password.  Masks are optional in the cancer centers. If you would like for your care team to wear a mask while they are taking care of you, please let them know. For doctor visits, patients may have with them one support person who is at least 74 years old. At this time, visitors are not allowed in the infusion area.

## 2021-08-17 ENCOUNTER — Other Ambulatory Visit: Payer: Self-pay | Admitting: Oncology

## 2021-09-03 ENCOUNTER — Other Ambulatory Visit: Payer: Self-pay

## 2021-09-05 NOTE — Progress Notes (Signed)
Patient Care Team: [name], MD as PCP - General (Internal Medicine) Nicholas Lose, MD as Consulting Physician (Hematology and Oncology) Coralie Keens, MD as Consulting Physician (General Surgery)  DIAGNOSIS: No diagnosis found.  SUMMARY OF ONCOLOGIC HISTORY: Oncology History  Malignant neoplasm of upper-outer quadrant of right breast in female, estrogen receptor positive ([place])  10/04/2020 Initial Diagnosis   Palpable lump in the right breast, mammogram detected 3.6 cm mass with several axillary lymph nodes, biopsy revealed grade 1-2 IDC, lymph nodes positive for cancer, ER 40%, PR 0%, Ki-67 15%, HER2 positive 3+ by [facility]   10/31/2020 Cancer Staging   Staging form: Breast, AJCC 8th Edition - Clinical stage from 10/31/2020: Stage IIA (cT2, cN1, cM0, G2, ER+, PR-, HER2+) - Signed by Nicholas Lose, MD on 10/31/2020 Stage prefix: Initial diagnosis Histologic grading system: 3 grade system   11/17/2020 - 03/01/2021 Chemotherapy   Patient is on Treatment Plan : BREAST  Docetaxel + Carboplatin + Trastuzumab + Pertuzumab  (TCHP) q21d  x 6     03/23/2021 -  Chemotherapy   Herceptin/Perjeta to complete one year of immunotherapy    04/09/2021 Surgery   Right lumpectomy and axillary node dissection: no residual cancer, 18 lymph nodes negative for cancer. ypT0,N0   04/09/2021 Cancer Staging   Staging form: Breast, AJCC 8th Edition - Pathologic stage from 04/09/2021: No Stage Recommended (ypT0, pN0, cM0) - Signed by Gardenia Phlegm, NP on 07/26/2021 Stage prefix: Post-therapy   05/2021 -  Radiation Therapy   Declined adjuvant radiation therapy    06/14/2021 -  Anti-estrogen oral therapy   Letrozole daily     CHIEF COMPLIANT:  Herceptin Perjeta maintenance  INTERVAL HISTORY: Lisa Chapman is a 76 y.o. with above-mentioned history of breast cancer, currently on chemotherapy with TCHP. She presents to the clinic today for treatment.     ALLERGIES:  is allergic to other and  boniva [ibandronic acid].  MEDICATIONS:  Current Outpatient Medications  Medication Sig Dispense Refill   letrozole (FEMARA) 2.5 MG tablet Take 1 tablet (2.5 mg total) by mouth daily. 90 tablet 3   lidocaine-prilocaine (EMLA) cream Apply to affected area once 30 g 3   rosuvastatin (CRESTOR) 20 MG tablet Take 20 mg by mouth daily.     sodium chloride (OCEAN) 0.65 % SOLN nasal spray Place 1 spray into both nostrils as needed for congestion.     traMADol (ULTRAM) 50 MG tablet Take 1 tablet (50 mg total) by mouth every 6 (six) hours as needed. 25 tablet 0   XARELTO 20 MG TABS tablet TAKE 1 TABLET BY MOUTH DAILY WITH SUPPER. 30 tablet 11   No current facility-administered medications for this visit.    PHYSICAL EXAMINATION: ECOG PERFORMANCE STATUS: {CHL ONC ECOG PS:[phone]}  There were no vitals filed for this visit. There were no vitals filed for this visit.  BREAST:*** No palpable masses or nodules in either right or left breasts. No palpable axillary supraclavicular or infraclavicular adenopathy no breast tenderness or nipple discharge. (exam performed in the presence of a chaperone)  LABORATORY DATA:  I have reviewed the data as listed    Latest Ref Rng & Units 08/16/2021    8:10 AM 07/26/2021    8:56 AM 06/14/2021    8:34 AM  CMP  Glucose 70 - 99 mg/dL 97  98  100   BUN 8 - 23 mg/dL 12  13  14    Creatinine 0.44 - 1.00 mg/dL 0.81  0.76  0.68  Sodium 135 - 145 mmol/L 140  141  141   Potassium 3.5 - 5.1 mmol/L 3.9  3.9  3.9   Chloride 98 - 111 mmol/L 106  107  108   CO2 22 - 32 mmol/L 26  28  27    Calcium 8.9 - 10.3 mg/dL 9.6  9.7  9.1   Total Protein 6.5 - 8.1 g/dL 7.8  7.8  7.4   Total Bilirubin 0.3 - 1.2 mg/dL 0.5  0.4  0.3   Alkaline Phos 38 - 126 U/L 65  68  69   AST 15 - 41 U/L 16  16  16    ALT 0 - 44 U/L 8  9  9      Lab Results  Component Value Date   WBC 6.5 08/16/2021   HGB 11.7 (L) 08/16/2021   HCT 36.2 08/16/2021   MCV 84.2 08/16/2021   PLT 220 08/16/2021    NEUTROABS 3.5 08/16/2021    ASSESSMENT & PLAN:  No problem-specific Assessment & Plan notes found for this encounter.    No orders of the defined types were placed in this encounter.  The patient has a good understanding of the overall plan. she agrees with it. she will call with any problems that may develop before the next visit here. Total time spent: 30 mins including face to face time and time spent for planning, charting and co-ordination of care   Suzzette Righter, [place] 09/05/21    I Gardiner Coins am scribing for Dr. Lindi Adie  ***

## 2021-09-06 ENCOUNTER — Encounter: Payer: Self-pay | Admitting: *Deleted

## 2021-09-06 ENCOUNTER — Inpatient Hospital Stay: Payer: Medicare HMO

## 2021-09-06 ENCOUNTER — Other Ambulatory Visit: Payer: Self-pay

## 2021-09-06 ENCOUNTER — Inpatient Hospital Stay (HOSPITAL_BASED_OUTPATIENT_CLINIC_OR_DEPARTMENT_OTHER): Payer: Medicare HMO | Admitting: Hematology and Oncology

## 2021-09-06 DIAGNOSIS — Z452 Encounter for adjustment and management of vascular access device: Secondary | ICD-10-CM | POA: Diagnosis not present

## 2021-09-06 DIAGNOSIS — C50411 Malignant neoplasm of upper-outer quadrant of right female breast: Secondary | ICD-10-CM

## 2021-09-06 DIAGNOSIS — Z170 Estrogen receptor positive status [ER+]: Secondary | ICD-10-CM | POA: Diagnosis not present

## 2021-09-06 DIAGNOSIS — Z79811 Long term (current) use of aromatase inhibitors: Secondary | ICD-10-CM | POA: Diagnosis not present

## 2021-09-06 DIAGNOSIS — Z95828 Presence of other vascular implants and grafts: Secondary | ICD-10-CM

## 2021-09-06 DIAGNOSIS — Z5112 Encounter for antineoplastic immunotherapy: Secondary | ICD-10-CM | POA: Diagnosis not present

## 2021-09-06 LAB — CBC WITH DIFFERENTIAL (CANCER CENTER ONLY)
Abs Immature Granulocytes: 0.01 10*3/uL (ref 0.00–0.07)
Basophils Absolute: 0 10*3/uL (ref 0.0–0.1)
Basophils Relative: 0 %
Eosinophils Absolute: 0.1 10*3/uL (ref 0.0–0.5)
Eosinophils Relative: 1 %
HCT: 35.3 % — ABNORMAL LOW (ref 36.0–46.0)
Hemoglobin: 11.3 g/dL — ABNORMAL LOW (ref 12.0–15.0)
Immature Granulocytes: 0 %
Lymphocytes Relative: 34 %
Lymphs Abs: 2 10*3/uL (ref 0.7–4.0)
MCH: 27 pg (ref 26.0–34.0)
MCHC: 32 g/dL (ref 30.0–36.0)
MCV: 84.2 fL (ref 80.0–100.0)
Monocytes Absolute: 0.5 10*3/uL (ref 0.1–1.0)
Monocytes Relative: 9 %
Neutro Abs: 3.3 10*3/uL (ref 1.7–7.7)
Neutrophils Relative %: 56 %
Platelet Count: 221 10*3/uL (ref 150–400)
RBC: 4.19 MIL/uL (ref 3.87–5.11)
RDW: 15.9 % — ABNORMAL HIGH (ref 11.5–15.5)
WBC Count: 5.9 10*3/uL (ref 4.0–10.5)
nRBC: 0 % (ref 0.0–0.2)

## 2021-09-06 LAB — CMP (CANCER CENTER ONLY)
ALT: 8 U/L (ref 0–44)
AST: 15 U/L (ref 15–41)
Albumin: 4.1 g/dL (ref 3.5–5.0)
Alkaline Phosphatase: 63 U/L (ref 38–126)
Anion gap: 4 — ABNORMAL LOW (ref 5–15)
BUN: 18 mg/dL (ref 8–23)
CO2: 29 mmol/L (ref 22–32)
Calcium: 9.2 mg/dL (ref 8.9–10.3)
Chloride: 107 mmol/L (ref 98–111)
Creatinine: 0.72 mg/dL (ref 0.44–1.00)
GFR, Estimated: >60 mL/min (ref >60)
Glucose, Bld: 99 mg/dL (ref 70–99)
Potassium: 4.1 mmol/L (ref 3.5–5.1)
Sodium: 140 mmol/L (ref 135–145)
Total Bilirubin: 0.3 mg/dL (ref 0.3–1.2)
Total Protein: 7.5 g/dL (ref 6.5–8.1)

## 2021-09-06 MED ORDER — SODIUM CHLORIDE 0.9 % IV SOLN: Freq: Once | INTRAVENOUS | Status: AC

## 2021-09-06 MED ORDER — ACETAMINOPHEN 325 MG PO TABS
650.0000 mg | ORAL_TABLET | Freq: Once | ORAL | Status: AC
  Administered 2021-09-06: 650 mg via ORAL
  Filled 2021-09-06: qty 2

## 2021-09-06 MED ORDER — HEPARIN SOD (PORK) LOCK FLUSH 100 UNIT/ML IV SOLN
500.0000 [IU] | Freq: Once | INTRAVENOUS | Status: AC | PRN
  Administered 2021-09-06: 500 [IU]

## 2021-09-06 MED ORDER — SODIUM CHLORIDE 0.9% FLUSH
10.0000 mL | INTRAVENOUS | Status: DC | PRN
  Administered 2021-09-06: 10 mL

## 2021-09-06 MED ORDER — SODIUM CHLORIDE 0.9% FLUSH
10.0000 mL | Freq: Once | INTRAVENOUS | Status: AC
  Administered 2021-09-06: 10 mL

## 2021-09-06 MED ORDER — SODIUM CHLORIDE 0.9 % IV SOLN
420.0000 mg | Freq: Once | INTRAVENOUS | Status: AC
  Administered 2021-09-06: 420 mg via INTRAVENOUS
  Filled 2021-09-06: qty 14

## 2021-09-06 MED ORDER — DIPHENHYDRAMINE HCL 25 MG PO CAPS
25.0000 mg | ORAL_CAPSULE | Freq: Once | ORAL | Status: AC
  Administered 2021-09-06: 25 mg via ORAL
  Filled 2021-09-06: qty 1

## 2021-09-06 MED ORDER — TRASTUZUMAB-ANNS CHEMO 150 MG IV SOLR
6.0000 mg/kg | Freq: Once | INTRAVENOUS | Status: AC
  Administered 2021-09-06: 378 mg via INTRAVENOUS
  Filled 2021-09-06: qty 18

## 2021-09-06 MED ORDER — ANASTROZOLE 1 MG PO TABS: 1.0000 mg | ORAL_TABLET | Freq: Every day | ORAL | 0 refills | Status: DC

## 2021-09-06 NOTE — Patient Instructions (Signed)
[facility]   Discharge Instructions: Thank you for choosing [facility] to provide your oncology and hematology care.   If you have a lab appointment with the [facility], please go directly to the [facility] and check in at the registration area.   Wear comfortable clothing and clothing appropriate for easy access to any Portacath or PICC line.   We strive to give you quality time with your provider. You may need to reschedule your appointment if you arrive late (15 or more minutes).  Arriving late affects you and other patients whose appointments are after yours.  Also, if you miss three or more appointments without notifying the office, you may be dismissed from the clinic at the provider's discretion.      For prescription refill requests, have your pharmacy contact our office and allow 72 hours for refills to be completed.    Today you received the following chemotherapy and/or immunotherapy agents: trastuzumab and pertuzumab      To help prevent nausea and vomiting after your treatment, we encourage you to take your nausea medication as directed.  BELOW ARE SYMPTOMS THAT SHOULD BE REPORTED IMMEDIATELY: *FEVER GREATER THAN 100.4 F (38 C) OR HIGHER *CHILLS OR SWEATING *NAUSEA AND VOMITING THAT IS NOT CONTROLLED WITH YOUR NAUSEA MEDICATION *UNUSUAL SHORTNESS OF BREATH *UNUSUAL BRUISING OR BLEEDING *URINARY PROBLEMS (pain or burning when urinating, or frequent urination) *BOWEL PROBLEMS (unusual diarrhea, constipation, pain near the anus) TENDERNESS IN MOUTH AND THROAT WITH OR WITHOUT PRESENCE OF ULCERS (sore throat, sores in mouth, or a toothache) UNUSUAL RASH, SWELLING OR PAIN  UNUSUAL VAGINAL DISCHARGE OR ITCHING   Items with * indicate a potential emergency and should be followed up as soon as possible or go to the Emergency Department if any problems should occur.  Please show the CHEMOTHERAPY ALERT CARD or IMMUNOTHERAPY ALERT  CARD at check-in to the Emergency Department and triage nurse.  Should you have questions after your visit or need to cancel or reschedule your appointment, please contact [facility]  Dept: [phone]  and follow the prompts.  Office hours are 8:00 a.m. to 4:30 p.m. Monday - Friday. Please note that voicemails left after 4:00 p.m. may not be returned until the following business day.  We are closed weekends and major holidays. You have access to a nurse at all times for urgent questions. Please call the main number to the clinic Dept: [phone] and follow the prompts.   For any non-urgent questions, you may also contact your provider using MyChart. We now offer e-Visits for anyone 12 and older to request care online for non-urgent symptoms. For details visit [email or web address].   Also download the MyChart app! Go to the app store, search "MyChart", open the app, select [facility], and log in with your MyChart username and password.  Masks are optional in the cancer centers. If you would like for your care team to wear a mask while they are taking care of you, please let them know. For doctor visits, patients may have with them one support person who is at least 74 years old. At this time, visitors are not allowed in the infusion area.

## 2021-09-06 NOTE — Assessment & Plan Note (Addendum)
Palpable lump in the right breast, mammogram detected 3.6 cm mass with several axillary lymph nodes, biopsy revealed grade 1-2 IDC, lymph nodes positive for cancer, ER 40%, PR 0%, Ki-67 15%, HER2 positive 3+ by IHC  Treatment plan: 1. Neoadjuvant chemotherapy with TCH Perjeta 6 cycles followed by Herceptin Perjeta maintenance for 1 year 2. Followed bylumpectomy and axillary node dissection completed on April 09, 2021 consistent with complete response 3.  Adjuvant radiation in [place] 4.Followed by antiestrogen therapy with letrozole 2.5 mg daily started 06/14/2021 __________________________________________________________________________________ Current treatment:Herceptin/Perjeta(to be completed end of September 2023)  Patient was able to meet her deductible and therefore she is willing to continue with the Herceptin and Perjeta treatment. Echo from 05/15/2021 reviewed, EF 60-65%  Letrozole toxicities: Severe dizziness related to discontinuation of letrozole. I recommended trying half a tablet of anastrozole for a month and we can reassess her symptoms.  Return to clinic every 3 weeks for Herceptin and Perjeta every 6 weeks for follow-up with [place].  Her last treatment will be 11/07/2021.

## 2021-09-10 ENCOUNTER — Telehealth: Payer: Self-pay | Admitting: Hematology and Oncology

## 2021-09-10 NOTE — Telephone Encounter (Signed)
Scheduled appointment per 7/27 los. Originally made contact with the patient but the phone call was dropped. I called the patient back but the call went straight to the voicemail. Scheduler left voicemail for the patient.

## 2021-09-11 ENCOUNTER — Other Ambulatory Visit: Payer: Self-pay

## 2021-09-17 ENCOUNTER — Other Ambulatory Visit: Payer: Self-pay

## 2021-09-26 ENCOUNTER — Encounter: Payer: Self-pay | Admitting: *Deleted

## 2021-09-27 ENCOUNTER — Inpatient Hospital Stay: Payer: Medicare HMO

## 2021-09-27 ENCOUNTER — Other Ambulatory Visit: Payer: Self-pay

## 2021-09-27 ENCOUNTER — Inpatient Hospital Stay: Payer: Medicare HMO | Attending: Hematology and Oncology

## 2021-09-27 VITALS — BP 160/79 | HR 75 | Temp 97.7°F | Resp 16 | Wt 144.5 lb

## 2021-09-27 DIAGNOSIS — C50411 Malignant neoplasm of upper-outer quadrant of right female breast: Secondary | ICD-10-CM | POA: Diagnosis present

## 2021-09-27 DIAGNOSIS — Z5112 Encounter for antineoplastic immunotherapy: Secondary | ICD-10-CM | POA: Insufficient documentation

## 2021-09-27 MED ORDER — ACETAMINOPHEN 325 MG PO TABS
650.0000 mg | ORAL_TABLET | Freq: Once | ORAL | Status: AC
  Administered 2021-09-27: 650 mg via ORAL
  Filled 2021-09-27: qty 2

## 2021-09-27 MED ORDER — DIPHENHYDRAMINE HCL 25 MG PO CAPS
25.0000 mg | ORAL_CAPSULE | Freq: Once | ORAL | Status: AC
  Administered 2021-09-27: 25 mg via ORAL
  Filled 2021-09-27: qty 1

## 2021-09-27 MED ORDER — SODIUM CHLORIDE 0.9 % IV SOLN: Freq: Once | INTRAVENOUS | Status: AC

## 2021-09-27 MED ORDER — TRASTUZUMAB-ANNS CHEMO 150 MG IV SOLR
6.0000 mg/kg | Freq: Once | INTRAVENOUS | Status: AC
  Administered 2021-09-27: 378 mg via INTRAVENOUS
  Filled 2021-09-27: qty 18

## 2021-09-27 MED ORDER — SODIUM CHLORIDE 0.9% FLUSH
10.0000 mL | INTRAVENOUS | Status: DC | PRN
  Administered 2021-09-27: 10 mL

## 2021-09-27 MED ORDER — HEPARIN SOD (PORK) LOCK FLUSH 100 UNIT/ML IV SOLN
500.0000 [IU] | Freq: Once | INTRAVENOUS | Status: AC | PRN
  Administered 2021-09-27: 500 [IU]

## 2021-09-27 MED ORDER — SODIUM CHLORIDE 0.9 % IV SOLN
420.0000 mg | Freq: Once | INTRAVENOUS | Status: AC
  Administered 2021-09-27: 420 mg via INTRAVENOUS
  Filled 2021-09-27: qty 14

## 2021-09-27 NOTE — Patient Instructions (Signed)
[facility]COLOG[facility]   Discharge Instructions: Thank you for choosing [facility] to provide your oncology and hematology care.   If you have a lab appointment with the Cancer Center, please go directly to the Cancer Center and check in at the registration area.   Wear comfortable clothing and clothing appropriate for easy access to any Portacath or PICC line.   We strive to give you quality time with your provider. You may need to reschedule your appointment if you arrive late (15 or more minutes).  Arriving late affects you and other patients whose appointments are after yours.  Also, if you miss three or more appointments without notifying the office, you may be dismissed from the clinic at the provider's discretion.      For prescription refill requests, have your pharmacy contact our office and allow 72 hours for refills to be completed.    Today you received the following chemotherapy and/or immunotherapy agents: trastuzumab-anns and pertuzumab      To help prevent nausea and vomiting after your treatment, we encourage you to take your nausea medication as directed.  BELOW ARE SYMPTOMS THAT SHOULD BE REPORTED IMMEDIATELY: *FEVER GREATER THAN 100.4 F (38 C) OR HIGHER *CHILLS OR SWEATING *NAUSEA AND VOMITING THAT IS NOT CONTROLLED WITH YOUR NAUSEA MEDICATION *UNUSUAL SHORTNESS OF BREATH *UNUSUAL BRUISING OR BLEEDING *URINARY PROBLEMS (pain or burning when urinating, or frequent urination) *BOWEL PROBLEMS (unusual diarrhea, constipation, pain near the anus) TENDERNESS IN MOUTH AND THROAT WITH OR WITHOUT PRESENCE OF ULCERS (sore throat, sores in mouth, or a toothache) UNUSUAL RASH, SWELLING OR PAIN  UNUSUAL VAGINAL DISCHARGE OR ITCHING   Items with * indicate a potential emergency and should be followed up as soon as possible or go to the Emergency Department if any problems should occur.  Please show the CHEMOTHERAPY ALERT CARD or IMMUNOTHERAPY  ALERT CARD at check-in to the Emergency Department and triage nurse.  Should you have questions after your visit or need to cancel or reschedule your appointment, please contact [facility]  Dept: [phone]  and follow the prompts.  Office hours are 8:00 a.m. to 4:30 p.m. Monday - Friday. Please note that voicemails left after 4:00 p.m. may not be returned until the following business day.  We are closed weekends and major holidays. You have access to a nurse at all times for urgent questions. Please call the main number to the clinic Dept: [phone] and follow the prompts.   For any non-urgent questions, you may also contact your provider using MyChart. We now offer e-Visits for anyone 18 and older to request care online for non-urgent symptoms. For details visit [email or web address].   Also download the MyChart app! Go to the app store, search "MyChart", open the app, select [facility], and log in with your MyChart username and password.  Masks are optional in the cancer centers. If you would like for your care team to wear a mask while they are taking care of you, please let them know. You may have one support person who is at least 74 years old accompany you for your appointments. 

## 2021-09-30 ENCOUNTER — Other Ambulatory Visit: Payer: Self-pay | Admitting: Hematology and Oncology

## 2021-10-01 ENCOUNTER — Ambulatory Visit: Payer: Medicare HMO | Attending: Surgery

## 2021-10-01 VITALS — Wt 144.0 lb

## 2021-10-01 DIAGNOSIS — Z483 Aftercare following surgery for neoplasm: Secondary | ICD-10-CM | POA: Insufficient documentation

## 2021-10-01 NOTE — Therapy (Addendum)
OUTPATIENT PHYSICAL THERAPY SOZO SCREENING NOTE   Patient Name: Lisa Chapman MRN: [id number] DOB:May 25, 1945, 76 y.o., female Today's Date: 10/01/2021  PCP: [name], MD REFERRING PROVIDER: Coralie Keens, MD   PT End of Session - 10/01/21 [phone]     Visit Number 1   # unchanged due to screen only   PT Start Time 0930    PT Stop Time 1829    PT Time Calculation (min) 14 min    Activity Tolerance Patient tolerated treatment well    Behavior During Therapy [facility] for tasks assessed/performed             Past Medical History:  Diagnosis Date   Breast cancer ([place])    Cancer ([place]) 2022   Breast Cancer   Diverticulosis    Dysrhythmia    Heart murmur    History of colonic polyps    History of DVT (deep vein thrombosis)    Right leg, April 2015   History of shingles    Mixed hyperlipidemia    Personal history of chemotherapy    Past Surgical History:  Procedure Laterality Date   BREAST LUMPECTOMY Right 04/09/2021   BREAST LUMPECTOMY WITH RADIOACTIVE SEED LOCALIZATION Right 04/09/2021   Procedure: RIGHT BREAST LUMPECTOMY WITH RADIOACTIVE SEED LOCALIZATION;  Surgeon: Coralie Keens, MD;  Location: [facility];  Service: General;  Laterality: Right;   HERNIA REPAIR     MENISCUS REPAIR Left 03/2019   PORTACATH PLACEMENT Left 11/03/2020   Procedure: INSERTION PORT-A-CATH;  Surgeon: Coralie Keens, MD;  Location: [facility];  Service: General;  Laterality: Left;   RADIOACTIVE SEED GUIDED AXILLARY SENTINEL LYMPH NODE Right 04/09/2021   Procedure: RADIOACTIVE SEED GUIDED RIGHT AXILLARY SENTINEL LYMPH NODE DISSECTION;  Surgeon: Coralie Keens, MD;  Location: [facility];  Service: General;  Laterality: Right;   TONSILLECTOMY     Patient Active Problem List   Diagnosis Date Noted   Port-A-Cath in place 11/17/2020   Malignant neoplasm of upper-outer quadrant of right breast in female, estrogen receptor positive ([place]) 10/31/2020   Recurrent  umbilical hernia 93/71/6967   DVT (deep venous thrombosis) ([place]) 03/22/2019   Paroxysmal supraventricular tachycardia ([place]) 03/08/2014   Cardiac murmur 03/08/2014   Mixed hyperlipidemia 03/08/2014    REFERRING DIAG: right breast cancer at risk for lymphedema  THERAPY DIAG: Aftercare following surgery for neoplasm  PERTINENT HISTORY: R breast cancer with mets to axillary lymph node, currently undergoing neoadjuvant chemo, plan is to undergo a R breast lumpectomy and ALND 04/09/21   PRECAUTIONS: right UE Lymphedema risk, None  SUBJECTIVE: Pt returns for her 3 month L-Dex screen. "My Rt shoulder is still aching a lot since I had the breast surgery. More so when I reach and it wakes me up at night"   PAIN:  Are you having pain? No, not currently. Just aches in Rt shoulder.   SOZO SCREENING: Patient was assessed today using the SOZO machine to determine the lymphedema index score. This was compared to her baseline score. It was determined that she is within the recommended range when compared to her baseline and no further action is needed at this time. She will continue SOZO screenings. These are done every 3 months for 2 years post operatively followed by every 6 months for 2 years, and then annually.  Also spent time educating pt that shoulder dysfunction (limited A/ROM/strength of surgical side and postural weakness) can be common side effects after the breast cancer surgeries. Also educated her that we  could treat those issues here in clinic. Pt was grateful for the information and plans on talking to Dr. Lindi Adie about that when she sees him beginning of next month. (She was told we could start treatment before then but she wanted to wait to speak with her doctor.) She also lives closer to [place] so let her know she could possibly be treated at our [place] clinic. Pt was also happy to learn this as well.    L-DEX FLOWSHEETS - 10/01/21 0900       L-DEX LYMPHEDEMA SCREENING   Measurement  Type Unilateral    L-DEX MEASUREMENT EXTREMITY Upper Extremity    POSITION  Standing    DOMINANT SIDE Right    At Risk Side Right    BASELINE SCORE (UNILATERAL) -0.5    L-DEX SCORE (UNILATERAL) -4.3    VALUE CHANGE (UNILAT) -3.8              Otelia Limes, PTA 10/01/2021, 10:33 AM

## 2021-10-03 ENCOUNTER — Other Ambulatory Visit: Payer: Self-pay

## 2021-10-04 ENCOUNTER — Other Ambulatory Visit: Payer: Self-pay | Admitting: Hematology and Oncology

## 2021-10-14 ENCOUNTER — Other Ambulatory Visit: Payer: Self-pay | Admitting: Hematology and Oncology

## 2021-10-15 ENCOUNTER — Other Ambulatory Visit: Payer: Self-pay | Admitting: Hematology and Oncology

## 2021-10-17 NOTE — Progress Notes (Signed)
Patient Care Team: [name], MD as PCP - General (Internal Medicine) Nicholas Lose, MD as Consulting Physician (Hematology and Oncology) Coralie Keens, MD as Consulting Physician (General Surgery)  DIAGNOSIS:  Encounter Diagnosis  Name Primary?   Malignant neoplasm of upper-outer quadrant of right breast in female, estrogen receptor positive ([place])     SUMMARY OF ONCOLOGIC HISTORY: Oncology History  Malignant neoplasm of upper-outer quadrant of right breast in female, estrogen receptor positive ([place])  10/04/2020 Initial Diagnosis   Palpable lump in the right breast, mammogram detected 3.6 cm mass with several axillary lymph nodes, biopsy revealed grade 1-2 IDC, lymph nodes positive for cancer, ER 40%, PR 0%, Ki-67 15%, HER2 positive 3+ by [facility]   10/31/2020 Cancer Staging   Staging form: Breast, AJCC 8th Edition - Clinical stage from 10/31/2020: Stage IIA (cT2, cN1, cM0, G2, ER+, PR-, HER2+) - Signed by Nicholas Lose, MD on 10/31/2020 Stage prefix: Initial diagnosis Histologic grading system: 3 grade system   11/17/2020 - 03/01/2021 Chemotherapy   Patient is on Treatment Plan : BREAST  Docetaxel + Carboplatin + Trastuzumab + Pertuzumab  (TCHP) q21d  x 6     03/23/2021 -  Chemotherapy   Herceptin/Perjeta to complete one year of immunotherapy    04/09/2021 Surgery   Right lumpectomy and axillary node dissection: no residual cancer, 18 lymph nodes negative for cancer. ypT0,N0   04/09/2021 Cancer Staging   Staging form: Breast, AJCC 8th Edition - Pathologic stage from 04/09/2021: No Stage Recommended (ypT0, pN0, cM0) - Signed by Gardenia Phlegm, NP on 07/26/2021 Stage prefix: Post-therapy   05/2021 -  Radiation Therapy   Declined adjuvant radiation therapy    06/14/2021 -  Anti-estrogen oral therapy   Letrozole daily     CHIEF COMPLIANT: Herceptin Perjeta maintenance on Letrozole    INTERVAL HISTORY: Lisa Chapman is a 76 y.o. with above-mentioned history of breast  cancer, currently on chemotherapy with TCHP. She presents to the clinic today for treatment. She states that she did ok last treatment. Denies diarrhea and any other symptoms. She reports that everything is good. She complains of itching on arms. She haven't changed anything but started the antiestrogen pill.. She use Cortizone creme but it doesn't relief it. She also needed a referral to physical therapy for her shoulder.   ALLERGIES:  is allergic to other and boniva [ibandronic acid].  MEDICATIONS:  Current Outpatient Medications  Medication Sig Dispense Refill   lidocaine-prilocaine (EMLA) cream Apply to affected area once 30 g 3   rosuvastatin (CRESTOR) 20 MG tablet Take 20 mg by mouth daily.     sodium chloride (OCEAN) 0.65 % SOLN nasal spray Place 1 spray into both nostrils as needed for congestion.     XARELTO 20 MG TABS tablet TAKE 1 TABLET BY MOUTH DAILY WITH SUPPER. 30 tablet 11   No current facility-administered medications for this visit.   Facility-Administered Medications Ordered in Other Visits  Medication Dose Route Frequency Provider Last Rate Last Admin   heparin lock flush 100 unit/mL  500 Units Intracatheter Once PRN Nicholas Lose, MD       pertuzumab (PERJETA) 420 mg in sodium chloride 0.9 % 250 mL chemo infusion  420 mg Intravenous Once Nicholas Lose, MD 528 mL/hr at 10/18/21 1256 420 mg at 10/18/21 1256   sodium chloride flush (NS) 0.9 % injection 10 mL  10 mL Intracatheter PRN Nicholas Lose, MD        PHYSICAL EXAMINATION: ECOG PERFORMANCE STATUS: 1 -  Symptomatic but completely ambulatory  Vitals:   10/18/21 1055  BP: (!) 145/77  Pulse: 66  Resp: 14  Temp: 97.7 F (36.5 C)  SpO2: 95%   Filed Weights   10/18/21 1055  Weight: 143 lb 6.4 oz (65 kg)     LABORATORY DATA:  I have reviewed the data as listed    Latest Ref Rng & Units 10/18/2021   10:35 AM 09/06/2021    8:32 AM 08/16/2021    8:10 AM  CMP  Glucose 70 - 99 mg/dL 101  99  97   BUN 8 - 23 mg/dL  _0 Creatinine 0.44 - 1.00 mg/dL 0.72  0.72  0.81   Sodium 135 - 145 mmol/L 140  140  140   Potassium 3.5 - 5.1 mmol/L 4.1  4.1  3.9   Chloride 98 - 111 mmol/L 106  107  106   CO2 22 - 32 mmol/L _1 Calcium 8.9 - 10.3 mg/dL 9.8  9.2  9.6   Total Protein 6.5 - 8.1 g/dL 8.2  7.5  7.8   Total Bilirubin 0.3 - 1.2 mg/dL 0.3  0.3  0.5   Alkaline Phos 38 - 126 U/L 69  63  65   AST 15 - 41 U/L _2 ALT 0 - 44 U/L _3 Lab Results  Component Value Date   WBC 7.5 10/18/2021   HGB 12.3 10/18/2021   HCT 37.9 10/18/2021   MCV 83.1 10/18/2021   PLT 232 10/18/2021   NEUTROABS 4.5 10/18/2021    ASSESSMENT & PLAN:  Malignant neoplasm of upper-outer quadrant of right breast in female, estrogen receptor positive ([place]) Malignant neoplasm of upper-outer quadrant of right breast in female, estrogen receptor positive ([facility]) Palpable lump in the right breast, mammogram detected 3.6 cm mass with several axillary lymph nodes, biopsy revealed grade 1-2 IDC, lymph nodes positive for cancer, ER 40%, PR 0%, Ki-67 15%, HER2 positive 3+ by IHC   Treatment plan: 1. Neoadjuvant chemotherapy with TCH Perjeta 6 cycles followed by Herceptin Perjeta maintenance for 1 year 2. Followed by lumpectomy and axillary node dissection completed on April 09, 2021 consistent with complete response 3.  Adjuvant radiation in [place] 4.  Followed by antiestrogen therapy with letrozole 2.5 mg daily started 06/14/2021 __________________________________________________________________________________ Current treatment: Herceptin/Perjeta (to be completed 11/08/2021)   Toxicities: Tolerating the treatment extremely well without any problems or concerns.  Denies any nausea vomiting or diarrhea.  Letrozole toxicities:  Skin itching on the hands: I recommended stopping letrozole therapy.  When we meet her in 3 weeks we can discuss if the itching will goes away to consider anastrozole.  We will see her  with her last treatment.    Orders Placed This Encounter  Procedures   Ambulatory referral to Physical Therapy    Referral Priority:   Routine    Referral Type:   Physical Medicine    Referral Reason:   Specialty Services Required    Requested Specialty:   Physical Therapy    Number of Visits Requested:   1   The patient has a good understanding of the overall plan. she agrees with it. she will call with any problems that may develop before the next visit here. Total time spent: 30 mins including face to face time and time spent for planning, charting and co-ordination of care   [place]  Loyal Gambler, MD 10/18/21    I Gardiner Coins am scribing for Dr. Lindi Adie  I have reviewed the above documentation for accuracy and completeness, and I agree with the above.

## 2021-10-18 ENCOUNTER — Inpatient Hospital Stay: Payer: Medicare HMO | Admitting: Hematology and Oncology

## 2021-10-18 ENCOUNTER — Other Ambulatory Visit: Payer: Self-pay

## 2021-10-18 ENCOUNTER — Inpatient Hospital Stay: Payer: Medicare HMO | Attending: Hematology and Oncology

## 2021-10-18 ENCOUNTER — Inpatient Hospital Stay: Payer: Medicare HMO

## 2021-10-18 DIAGNOSIS — Z95828 Presence of other vascular implants and grafts: Secondary | ICD-10-CM

## 2021-10-18 DIAGNOSIS — R2242 Localized swelling, mass and lump, left lower limb: Secondary | ICD-10-CM | POA: Insufficient documentation

## 2021-10-18 DIAGNOSIS — Z5112 Encounter for antineoplastic immunotherapy: Secondary | ICD-10-CM | POA: Diagnosis present

## 2021-10-18 DIAGNOSIS — C50411 Malignant neoplasm of upper-outer quadrant of right female breast: Secondary | ICD-10-CM | POA: Insufficient documentation

## 2021-10-18 DIAGNOSIS — Z170 Estrogen receptor positive status [ER+]: Secondary | ICD-10-CM

## 2021-10-18 DIAGNOSIS — Z79811 Long term (current) use of aromatase inhibitors: Secondary | ICD-10-CM | POA: Insufficient documentation

## 2021-10-18 LAB — CBC WITH DIFFERENTIAL (CANCER CENTER ONLY)
Abs Immature Granulocytes: 0.02 10*3/uL (ref 0.00–0.07)
Basophils Absolute: 0 10*3/uL (ref 0.0–0.1)
Basophils Relative: 0 %
Eosinophils Absolute: 0.1 10*3/uL (ref 0.0–0.5)
Eosinophils Relative: 2 %
HCT: 37.9 % (ref 36.0–46.0)
Hemoglobin: 12.3 g/dL (ref 12.0–15.0)
Immature Granulocytes: 0 %
Lymphocytes Relative: 29 %
Lymphs Abs: 2.2 10*3/uL (ref 0.7–4.0)
MCH: 27 pg (ref 26.0–34.0)
MCHC: 32.5 g/dL (ref 30.0–36.0)
MCV: 83.1 fL (ref 80.0–100.0)
Monocytes Absolute: 0.7 10*3/uL (ref 0.1–1.0)
Monocytes Relative: 9 %
Neutro Abs: 4.5 10*3/uL (ref 1.7–7.7)
Neutrophils Relative %: 60 %
Platelet Count: 232 10*3/uL (ref 150–400)
RBC: 4.56 MIL/uL (ref 3.87–5.11)
RDW: 15.3 % (ref 11.5–15.5)
WBC Count: 7.5 10*3/uL (ref 4.0–10.5)
nRBC: 0 % (ref 0.0–0.2)

## 2021-10-18 LAB — CMP (CANCER CENTER ONLY)
ALT: 9 U/L (ref 0–44)
AST: 16 U/L (ref 15–41)
Albumin: 4.4 g/dL (ref 3.5–5.0)
Alkaline Phosphatase: 69 U/L (ref 38–126)
Anion gap: 5 (ref 5–15)
BUN: 14 mg/dL (ref 8–23)
CO2: 29 mmol/L (ref 22–32)
Calcium: 9.8 mg/dL (ref 8.9–10.3)
Chloride: 106 mmol/L (ref 98–111)
Creatinine: 0.72 mg/dL (ref 0.44–1.00)
GFR, Estimated: >60 mL/min (ref >60)
Glucose, Bld: 101 mg/dL — ABNORMAL HIGH (ref 70–99)
Potassium: 4.1 mmol/L (ref 3.5–5.1)
Sodium: 140 mmol/L (ref 135–145)
Total Bilirubin: 0.3 mg/dL (ref 0.3–1.2)
Total Protein: 8.2 g/dL — ABNORMAL HIGH (ref 6.5–8.1)

## 2021-10-18 MED ORDER — SODIUM CHLORIDE 0.9 % IV SOLN
420.0000 mg | Freq: Once | INTRAVENOUS | Status: AC
  Administered 2021-10-18: 420 mg via INTRAVENOUS
  Filled 2021-10-18: qty 14

## 2021-10-18 MED ORDER — DIPHENHYDRAMINE HCL 25 MG PO CAPS
25.0000 mg | ORAL_CAPSULE | Freq: Once | ORAL | Status: AC
  Administered 2021-10-18: 25 mg via ORAL
  Filled 2021-10-18: qty 1

## 2021-10-18 MED ORDER — ACETAMINOPHEN 325 MG PO TABS
650.0000 mg | ORAL_TABLET | Freq: Once | ORAL | Status: AC
  Administered 2021-10-18: 650 mg via ORAL
  Filled 2021-10-18: qty 2

## 2021-10-18 MED ORDER — TRASTUZUMAB-ANNS CHEMO 150 MG IV SOLR
6.0000 mg/kg | Freq: Once | INTRAVENOUS | Status: AC
  Administered 2021-10-18: 378 mg via INTRAVENOUS
  Filled 2021-10-18: qty 18

## 2021-10-18 MED ORDER — SODIUM CHLORIDE 0.9% FLUSH
10.0000 mL | Freq: Once | INTRAVENOUS | Status: AC
  Administered 2021-10-18: 10 mL

## 2021-10-18 MED ORDER — SODIUM CHLORIDE 0.9% FLUSH
10.0000 mL | INTRAVENOUS | Status: DC | PRN
  Administered 2021-10-18: 10 mL

## 2021-10-18 MED ORDER — HEPARIN SOD (PORK) LOCK FLUSH 100 UNIT/ML IV SOLN
500.0000 [IU] | Freq: Once | INTRAVENOUS | Status: AC | PRN
  Administered 2021-10-18: 500 [IU]

## 2021-10-18 MED ORDER — SODIUM CHLORIDE 0.9 % IV SOLN: Freq: Once | INTRAVENOUS | Status: AC

## 2021-10-18 NOTE — Patient Instructions (Signed)
[facility]  Discharge Instructions: Thank you for choosing [facility] to provide your oncology and hematology care.   If you have a lab appointment with the Cancer Center, please go directly to the Cancer Center and check in at the registration area.   Wear comfortable clothing and clothing appropriate for easy access to any Portacath or PICC line.   We strive to give you quality time with your provider. You may need to reschedule your appointment if you arrive late (15 or more minutes).  Arriving late affects you and other patients whose appointments are after yours.  Also, if you miss three or more appointments without notifying the office, you may be dismissed from the clinic at the provider's discretion.      For prescription refill requests, have your pharmacy contact our office and allow 72 hours for refills to be completed.    Today you received the following chemotherapy and/or immunotherapy agents: trastuzumab, pertuzumab      To help prevent nausea and vomiting after your treatment, we encourage you to take your nausea medication as directed.  BELOW ARE SYMPTOMS THAT SHOULD BE REPORTED IMMEDIATELY: *FEVER GREATER THAN 100.4 F (38 C) OR HIGHER *CHILLS OR SWEATING *NAUSEA AND VOMITING THAT IS NOT CONTROLLED WITH YOUR NAUSEA MEDICATION *UNUSUAL SHORTNESS OF BREATH *UNUSUAL BRUISING OR BLEEDING *URINARY PROBLEMS (pain or burning when urinating, or frequent urination) *BOWEL PROBLEMS (unusual diarrhea, constipation, pain near the anus) TENDERNESS IN MOUTH AND THROAT WITH OR WITHOUT PRESENCE OF ULCERS (sore throat, sores in mouth, or a toothache) UNUSUAL RASH, SWELLING OR PAIN  UNUSUAL VAGINAL DISCHARGE OR ITCHING   Items with * indicate a potential emergency and should be followed up as soon as possible or go to the Emergency Department if any problems should occur.  Please show the CHEMOTHERAPY ALERT CARD or IMMUNOTHERAPY ALERT CARD  at check-in to the Emergency Department and triage nurse.  Should you have questions after your visit or need to cancel or reschedule your appointment, please contact [facility]  Dept: [phone]  and follow the prompts.  Office hours are 8:00 a.m. to 4:30 p.m. Monday - Friday. Please note that voicemails left after 4:00 p.m. may not be returned until the following business day.  We are closed weekends and major holidays. You have access to a nurse at all times for urgent questions. Please call the main number to the clinic Dept: [phone] and follow the prompts.   For any non-urgent questions, you may also contact your provider using MyChart. We now offer e-Visits for anyone 18 and older to request care online for non-urgent symptoms. For details visit [email or web address].   Also download the MyChart app! Go to the app store, search "MyChart", open the app, select [facility], and log in with your MyChart username and password.  Masks are optional in the cancer centers. If you would like for your care team to wear a mask while they are taking care of you, please let them know. You may have one support person who is at least 74 years old accompany you for your appointments. 

## 2021-10-18 NOTE — Assessment & Plan Note (Addendum)
Malignant neoplasm of upper-outer quadrant of right breast in female, estrogen receptor positive ([facility]) Palpable lump in the right breast, mammogram detected 3.6 cm mass with several axillary lymph nodes, biopsy revealed grade 1-2 IDC, lymph nodes positive for cancer, ER 40%, PR 0%, Ki-67 15%, HER2 positive 3+ by IHC  Treatment plan: 1. Neoadjuvant chemotherapy with TCH Perjeta 6 cycles followed by Herceptin Perjeta maintenance for 1 year 2. Followed bylumpectomy and axillary node dissection completed on April 09, 2021 consistent with complete response 3.Adjuvant radiation in [place] 4.Followed by antiestrogen therapywith letrozole 2.5 mg daily started 06/14/2021 __________________________________________________________________________________ Current treatment:Herceptin/Perjeta(to be completed 11/08/2021) Toxicities: Tolerating the treatment extremely well without any problems or concerns.  Denies any nausea vomiting or diarrhea.  Letrozole toxicities:  Skin itching on the hands: I recommended stopping letrozole therapy.  When we meet her in 3 weeks we can discuss if the itching will goes away to consider anastrozole.  We will see her with her last treatment.

## 2021-10-19 ENCOUNTER — Other Ambulatory Visit: Payer: Self-pay | Admitting: Surgery

## 2021-10-20 ENCOUNTER — Other Ambulatory Visit: Payer: Self-pay

## 2021-10-24 ENCOUNTER — Ambulatory Visit: Payer: Medicare HMO | Admitting: Physical Therapy

## 2021-10-24 DIAGNOSIS — Z299 Encounter for prophylactic measures, unspecified: Secondary | ICD-10-CM | POA: Diagnosis not present

## 2021-10-24 DIAGNOSIS — L299 Pruritus, unspecified: Secondary | ICD-10-CM | POA: Diagnosis not present

## 2021-10-24 DIAGNOSIS — Z2821 Immunization not carried out because of patient refusal: Secondary | ICD-10-CM | POA: Diagnosis not present

## 2021-10-24 DIAGNOSIS — Z789 Other specified health status: Secondary | ICD-10-CM | POA: Diagnosis not present

## 2021-10-24 DIAGNOSIS — I10 Essential (primary) hypertension: Secondary | ICD-10-CM | POA: Diagnosis not present

## 2021-10-29 ENCOUNTER — Other Ambulatory Visit: Payer: Self-pay

## 2021-10-29 ENCOUNTER — Encounter: Payer: Self-pay | Admitting: Rehabilitation

## 2021-10-29 ENCOUNTER — Ambulatory Visit: Payer: Medicare HMO | Attending: Surgery | Admitting: Rehabilitation

## 2021-10-29 DIAGNOSIS — M25511 Pain in right shoulder: Secondary | ICD-10-CM | POA: Insufficient documentation

## 2021-10-29 DIAGNOSIS — R293 Abnormal posture: Secondary | ICD-10-CM | POA: Diagnosis not present

## 2021-10-29 DIAGNOSIS — Z170 Estrogen receptor positive status [ER+]: Secondary | ICD-10-CM | POA: Insufficient documentation

## 2021-10-29 DIAGNOSIS — Z483 Aftercare following surgery for neoplasm: Secondary | ICD-10-CM | POA: Diagnosis not present

## 2021-10-29 DIAGNOSIS — C50411 Malignant neoplasm of upper-outer quadrant of right female breast: Secondary | ICD-10-CM | POA: Insufficient documentation

## 2021-10-29 NOTE — Therapy (Signed)
OUTPATIENT PHYSICAL THERAPY ONCOLOGY EVALUATION  Patient Name: Lisa Chapman MRN: [id number] DOB:[birth year], 76 y.o., female Today's Date: 10/29/2021   PT End of Session - 10/29/21 0954     Visit Number 2    Number of Visits 14    Date for PT Re-Evaluation 12/10/21    PT Start Time 1000    PT Stop Time 1047    PT Time Calculation (min) 47 min    Activity Tolerance Patient tolerated treatment well    Behavior During Therapy [facility] for tasks assessed/performed             Past Medical History:  Diagnosis Date   Breast cancer ([place])    Cancer ([place]) 2022   Breast Cancer   Diverticulosis    Dysrhythmia    Heart murmur    History of colonic polyps    History of DVT (deep vein thrombosis)    Right leg, April 2015   History of shingles    Mixed hyperlipidemia    Personal history of chemotherapy    Past Surgical History:  Procedure Laterality Date   BREAST LUMPECTOMY Right 04/09/2021   BREAST LUMPECTOMY WITH RADIOACTIVE SEED LOCALIZATION Right 04/09/2021   Procedure: RIGHT BREAST LUMPECTOMY WITH RADIOACTIVE SEED LOCALIZATION;  Surgeon: Coralie Keens, MD;  Location: [place];  Service: General;  Laterality: Right;   HERNIA REPAIR     MENISCUS REPAIR Left 03/2019   PORTACATH PLACEMENT Left 11/03/2020   Procedure: INSERTION PORT-A-CATH;  Surgeon: Coralie Keens, MD;  Location: [place];  Service: General;  Laterality: Left;   RADIOACTIVE SEED GUIDED AXILLARY SENTINEL LYMPH NODE Right 04/09/2021   Procedure: RADIOACTIVE SEED GUIDED RIGHT AXILLARY SENTINEL LYMPH NODE DISSECTION;  Surgeon: Coralie Keens, MD;  Location: [place];  Service: General;  Laterality: Right;   TONSILLECTOMY     Patient Active Problem List   Diagnosis Date Noted   Port-A-Cath in place 11/17/2020   Malignant neoplasm of upper-outer quadrant of right breast in female, estrogen receptor positive ([place]) 10/31/2020   Recurrent umbilical hernia 63/89/3734   DVT (deep  venous thrombosis) ([place]) 03/22/2019   Paroxysmal supraventricular tachycardia ([place]) 03/08/2014   Cardiac murmur 03/08/2014   Mixed hyperlipidemia 03/08/2014    PCP: Dr. Woody Seller  REFERRING PROVIDER: Dr. Lindi Adie  REFERRING DIAG: Rt shoulder pain.    THERAPY DIAG:  Aftercare following surgery for neoplasm  Abnormal posture  Malignant neoplasm of upper-outer quadrant of right breast in female, estrogen receptor positive ([place])  Acute pain of right shoulder  ONSET DATE: 04/09/21  Rationale for Evaluation and Treatment Rehabilitation  SUBJECTIVE  SUBJECTIVE STATEMENT: I have a hard time getting comfortable at night and I have a hard time raising it out to the side.  It has been like that since surgery.  It is not getting much worse.  No hx of problems.    PERTINENT HISTORY:  R breast cancer with mets to axillary lymph node, completed neoadjuvant chemo currently on Herceptin, Rt breast lumpectomy and ALND 04/09/21 with 18 negative nodes removed.  Declined radiation.    PAIN:  Are you having pain? Yes - it aches at rest NPRS scale: 4/10 up to 9/10 Pain location: side of the right shoulder and up into the neck  Pain orientation: Right  PAIN TYPE: aching and dull Pain description: constant  Aggravating factors: reaching out to the side, behind the back, lifting or carrying, sleeping Relieving factors: nothing   PRECAUTIONS: lymphedema risk   WEIGHT BEARING RESTRICTIONS No  FALLS:  Has patient fallen in last 6 months? No  LIVING ENVIRONMENT: Lives with: lives with their family and lives with their spouse Lives in: Mobile home  OCCUPATION: I am unable to work but would like to go back.  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    OBSERVATIONS / OTHER ASSESSMENTS: well healed incisions, no edema  POSTURE: forward head  UPPER EXTREMITY AROM/PROM:  A/PROM RIGHT   03/14/21  10/29/21  Shoulder extension 89 30 - Rt UT pain   Shoulder flexion 161 130 - pull  Shoulder abduction 175 105 - pain posterior shoulder   Shoulder internal rotation    Shoulder external rotation 105     (Blank rows = not tested)  A/PROM LEFT   eval  Shoulder extension   Shoulder flexion   Shoulder abduction   Shoulder internal rotation   Shoulder external rotation     (Blank rows = not tested)   CERVICAL AROM: All within normal limits:     Flexion 55  Extension 30  Right lateral flexion 30  Left lateral flexion 20-pull  Right rotation 55  Left rotation 55    UPPER EXTREMITY MMT:  MMT Right eval Left eval  Shoulder flexion Pain in UT with resistance light 4/5  Shoulder extension 4/5 4/5  Shoulder abduction Push at around 45 deg- pain in Rt UT 4/5  Shoulder adduction    Shoulder extension    Shoulder internal rotation 4/5 4/5  Shoulder external rotation 4/5 4/5  Middle trapezius    Lower trapezius    Elbow flexion 5/5 5/5  Elbow extension    Wrist flexion    Wrist extension    Wrist ulnar deviation    Wrist radial deviation    Wrist pronation    Wrist supination    Grip strength 22# 38#   (Blank rows = not tested)   QUICK DASH SURVEY: 68%  TODAY'S TREATMENT  10/29/21: POC education Performance on HEP per below for education  PATIENT EDUCATION:  Education details: POC, HEP Person educated: Patient Education method: Consulting civil engineer, Media planner, Corporate treasurer cues, Verbal cues, and Handouts Education comprehension: verbalized understanding, returned demonstration, and needs further education  HOME  EXERCISE PROGRAM: Access code Lost program consisted of: Rt upper trap stretch       Rt  levator scapula stretch       Wall flexion for latissimus stretch       retractions  ASSESSMENT:  CLINICAL IMPRESSION: Patient is a 76 y.o. female who was seen today for physical therapy evaluation and treatment for her continued Rt upper quadrant pain after breast cancer treatment.  Pt has muscular tightness and pain in the Rt uppertrap, rhomboids, latissimus, and posterior shoulder.  Pain is also referred into the the neck with resisted movements and some AROM of the shoulder.  Pt will benefit from STM/PROM/stretching education for the Rt upper quadrant to decrease pain and guarding pain from after surgery.  Pt will still be screened for lymphedema every 3 months at cancer rehab but will attend PT at [facility] due to location.    OBJECTIVE IMPAIRMENTS decreased activity tolerance, decreased mobility, decreased ROM, and pain.   ACTIVITY LIMITATIONS carrying, lifting, and sleeping  PARTICIPATION LIMITATIONS: cleaning, laundry, community activity, and occupation  PERSONAL FACTORS Age and 1-2 comorbidities: SLNB, lumpectomy hx  are also affecting patient's functional outcome.   REHAB POTENTIAL: Good  CLINICAL DECISION MAKING: Stable/uncomplicated  EVALUATION COMPLEXITY: Moderate  GOALS: Goals reviewed with patient? Yes   LONG TERM GOALS: Target date: 12/10/2021    Pt will improve Rt AROM to within 5 degrees of pre-operative baseline per above Baseline:  Goal status: INITIAL  2.  Pt will report resting pain of 0/10 Baseline:  Goal status: INITIAL  3.  Pt will report improved ability to sleep by at least 50% due to less pain Baseline:  Goal status: INITIAL  4.  Pt will be ind with final HEP Baseline:  Goal status: INITIAL  PLAN: PT FREQUENCY: 2x/week  PT DURATION: 6 weeks with continued SOZO screens (Don't need to DC episode when done)  PLANNED INTERVENTIONS: Therapeutic exercises,  Patient/Family education, Self Care, Dry Needling, Ionotophoresis '4mg'$ /ml Dexamethasone, Manual therapy, and Re-evaluation  PLAN FOR NEXT SESSION: Rt upper quadrant STM, Rt shoulder PROM, consider DN and ionto when signed, add TE as able with less pain.  (No [place], heat)   Stark Bray, PT 10/29/2021, 7:14 PM

## 2021-10-31 ENCOUNTER — Ambulatory Visit: Payer: Medicare HMO | Admitting: Physical Therapy

## 2021-10-31 ENCOUNTER — Encounter: Payer: Self-pay | Admitting: Physical Therapy

## 2021-10-31 DIAGNOSIS — R293 Abnormal posture: Secondary | ICD-10-CM

## 2021-10-31 DIAGNOSIS — Z170 Estrogen receptor positive status [ER+]: Secondary | ICD-10-CM | POA: Diagnosis not present

## 2021-10-31 DIAGNOSIS — Z483 Aftercare following surgery for neoplasm: Secondary | ICD-10-CM | POA: Diagnosis not present

## 2021-10-31 DIAGNOSIS — C50411 Malignant neoplasm of upper-outer quadrant of right female breast: Secondary | ICD-10-CM | POA: Diagnosis not present

## 2021-10-31 DIAGNOSIS — M25511 Pain in right shoulder: Secondary | ICD-10-CM | POA: Diagnosis not present

## 2021-10-31 NOTE — Therapy (Signed)
OUTPATIENT PHYSICAL THERAPY ONCOLOGY TREATMENT  Patient Name: Lisa Chapman MRN: [id number] DOB:02-09-1946, 76 y.o., female Today's Date: 10/31/2021   PT End of Session - 10/31/21 0901     Visit Number 3    Number of Visits 14    Date for PT Re-Evaluation 12/10/21    PT Start Time 0902    PT Stop Time 0945    PT Time Calculation (min) 43 min    Activity Tolerance Patient tolerated treatment well    Behavior During Therapy [facility] for tasks assessed/performed             Past Medical History:  Diagnosis Date   Breast cancer ([place])    Cancer ([place]) 2022   Breast Cancer   Diverticulosis    Dysrhythmia    Heart murmur    History of colonic polyps    History of DVT (deep vein thrombosis)    Right leg, April 2015   History of shingles    Mixed hyperlipidemia    Personal history of chemotherapy    Past Surgical History:  Procedure Laterality Date   BREAST LUMPECTOMY Right 04/09/2021   BREAST LUMPECTOMY WITH RADIOACTIVE SEED LOCALIZATION Right 04/09/2021   Procedure: RIGHT BREAST LUMPECTOMY WITH RADIOACTIVE SEED LOCALIZATION;  Surgeon: Coralie Keens, MD;  Location: [place];  Service: General;  Laterality: Right;   HERNIA REPAIR     MENISCUS REPAIR Left 03/2019   PORTACATH PLACEMENT Left 11/03/2020   Procedure: INSERTION PORT-A-CATH;  Surgeon: Coralie Keens, MD;  Location: [place];  Service: General;  Laterality: Left;   RADIOACTIVE SEED GUIDED AXILLARY SENTINEL LYMPH NODE Right 04/09/2021   Procedure: RADIOACTIVE SEED GUIDED RIGHT AXILLARY SENTINEL LYMPH NODE DISSECTION;  Surgeon: Coralie Keens, MD;  Location: [place];  Service: General;  Laterality: Right;   TONSILLECTOMY     Patient Active Problem List   Diagnosis Date Noted   Port-A-Cath in place 11/17/2020   Malignant neoplasm of upper-outer quadrant of right breast in female, estrogen receptor positive ([place]) 10/31/2020   Recurrent umbilical hernia 09/62/8366   DVT (deep  venous thrombosis) ([place]) 03/22/2019   Paroxysmal supraventricular tachycardia ([place]) 03/08/2014   Cardiac murmur 03/08/2014   Mixed hyperlipidemia 03/08/2014    PCP: Dr. Woody Seller  REFERRING PROVIDER: Dr. Lindi Adie  REFERRING DIAG: Rt shoulder pain.    THERAPY DIAG:  Aftercare following surgery for neoplasm  Abnormal posture  ONSET DATE: 04/09/21  Rationale for Evaluation and Treatment Rehabilitation  SUBJECTIVE  SUBJECTIVE STATEMENT: Touch is very painful to R shoulder. Tries to use mostly LUE due to pain in R shoulder. Surgery was in Februrary 2023  in which 13 lymph nodes were removed. Has a hard time sleeping due to positioning and pain of R shoulder. Pain begins at approximately 90 deg abduction and flexion.   PERTINENT HISTORY:  R breast cancer with mets to axillary lymph node, completed neoadjuvant chemo currently on Herceptin, Rt breast lumpectomy and ALND 04/09/21 with 18 negative nodes removed.  Declined radiation.    PAIN:  Are you having pain? Yes - it aches at rest NPRS scale: 4/10 up to 9/10 Pain location: side of the right shoulder and up into the neck  Pain orientation: Right  PAIN TYPE: aching and dull Pain description: constant  Aggravating factors: reaching out to the side, behind the back, lifting or carrying, sleeping Relieving factors: nothing   PRECAUTIONS: lymphedema risk   WEIGHT BEARING RESTRICTIONS No  FALLS:  Has patient fallen in last 6 months? No  OBJECTIVE  UPPER EXTREMITY AROM/PROM:  A/PROM RIGHT   03/14/21  10/29/21  Shoulder extension 89 30 - Rt UT pain   Shoulder flexion 161 130 - pull  Shoulder abduction 175 105 - pain posterior shoulder   Shoulder internal rotation    Shoulder external rotation 105     (Blank rows = not tested)  A/PROM LEFT   eval   Shoulder extension   Shoulder flexion   Shoulder abduction   Shoulder internal rotation   Shoulder external rotation     (Blank rows = not tested)   CERVICAL AROM: All within normal limits:     Flexion 55  Extension 30  Right lateral flexion 30  Left lateral flexion 20-pull  Right rotation 55  Left rotation 55      UPPER EXTREMITY MMT:  MMT Right eval Left eval  Shoulder flexion Pain in UT with resistance light 4/5  Shoulder extension 4/5 4/5  Shoulder abduction Push at around 45 deg- pain in Rt UT 4/5  Shoulder adduction    Shoulder extension    Shoulder internal rotation 4/5 4/5  Shoulder external rotation 4/5 4/5  Middle trapezius    Lower trapezius    Elbow flexion 5/5 5/5  Elbow extension    Wrist flexion    Wrist extension    Wrist ulnar deviation    Wrist radial deviation    Wrist pronation    Wrist supination    Grip strength 22# 38#   (Blank rows = not tested)   QUICK DASH SURVEY: 68%  TODAY'S TREATMENT                                     EXERCISE LOG  Exercise Repetitions and Resistance Comments  Pulley X5 min   Wall ladder  X3 min   ER stretch 2x30 sec   AROM cervical rotation X10 reps   AROM cervical lateral lean X10 reps   UE ranger Flex x3 min, ER/IR x2 min    Blank cell = exercise not performed today    Manual Therapy  Soft Tissue Mobilization: R shoulder, STW to posterior cuff, deltoids to reduce pain (light pressure)   PATIENT EDUCATION:  Education details: POC, HEP Person educated: Patient Education method: Consulting civil engineer, Demonstration, Tactile cues, Verbal cues, and Handouts Education comprehension: verbalized understanding, returned demonstration, and needs further education  HOME EXERCISE PROGRAM: Access code  Lost program consisted of: Rt upper trap stretch       Rt levator scapula stretch       Wall flexion for latissimus stretch       retractions  ASSESSMENT:  CLINICAL IMPRESSION: Patient presented in clinic with R  shoulder pain since surgery in February 2023. Patient limited with R shoulder AROM due to pain. Weakness also noted but patient has recently noticed weakness with opening jars as well. Patient able to tolerate treatment fairly well but reported pain with eccentric return after stretching or flexion on wall ladder. Greatest pain and limitation at approximately 90 deg shoulder flexion and abduction. Light pressure STW completed especially to R deltoids with minimal report of pain during STW session. Patient did indicate soreness after treatment but encouraged to continue use of R shoulder and not be fearful of use.   OBJECTIVE IMPAIRMENTS decreased activity tolerance, decreased mobility, decreased ROM, and pain.   ACTIVITY LIMITATIONS carrying, lifting, and sleeping  PARTICIPATION LIMITATIONS: cleaning, laundry, community activity, and occupation  PERSONAL FACTORS Age and 1-2 comorbidities: SLNB, lumpectomy hx  are also affecting patient's functional outcome.   REHAB POTENTIAL: Good  CLINICAL DECISION MAKING: Stable/uncomplicated  EVALUATION COMPLEXITY: Moderate  GOALS: Goals reviewed with patient? Yes   LONG TERM GOALS: Target date: 12/12/2021    Pt will improve Rt AROM to within 5 degrees of pre-operative baseline per above Baseline:  Goal status: INITIAL  2.  Pt will report resting pain of 0/10 Baseline:  Goal status: INITIAL  3.  Pt will report improved ability to sleep by at least 50% due to less pain Baseline:  Goal status: INITIAL  4.  Pt will be ind with final HEP Baseline:  Goal status: INITIAL  PLAN: PT FREQUENCY: 2x/week  PT DURATION: 6 weeks with continued SOZO screens (Don't need to DC episode when done)  PLANNED INTERVENTIONS: Therapeutic exercises, Patient/Family education, Self Care, Dry Needling, Ionotophoresis '4mg'$ /ml Dexamethasone, Manual therapy, and Re-evaluation  PLAN FOR NEXT SESSION: Rt upper quadrant STM, Rt shoulder PROM, consider DN and ionto when  signed, add TE as able with less pain.  (No [place], heat)   Standley Brooking, PTA 10/31/2021, 9:53 AM

## 2021-11-05 ENCOUNTER — Encounter: Payer: Self-pay | Admitting: Physical Therapy

## 2021-11-05 ENCOUNTER — Ambulatory Visit: Payer: Medicare HMO | Admitting: Physical Therapy

## 2021-11-05 DIAGNOSIS — Z170 Estrogen receptor positive status [ER+]: Secondary | ICD-10-CM | POA: Diagnosis not present

## 2021-11-05 DIAGNOSIS — C50411 Malignant neoplasm of upper-outer quadrant of right female breast: Secondary | ICD-10-CM

## 2021-11-05 DIAGNOSIS — R293 Abnormal posture: Secondary | ICD-10-CM

## 2021-11-05 DIAGNOSIS — M25511 Pain in right shoulder: Secondary | ICD-10-CM

## 2021-11-05 DIAGNOSIS — Z483 Aftercare following surgery for neoplasm: Secondary | ICD-10-CM

## 2021-11-05 NOTE — Therapy (Signed)
OUTPATIENT PHYSICAL THERAPY ONCOLOGY TREATMENT  Patient Name: Lisa Chapman MRN: [id number] DOB:[birth year], 76 y.o., female Today's Date: 11/05/2021   PT End of Session - 11/05/21 1033     Visit Number 4    Number of Visits 14    Date for PT Re-Evaluation 12/10/21    PT Start Time 1032    PT Stop Time 1112    PT Time Calculation (min) 40 min    Activity Tolerance Patient tolerated treatment well    Behavior During Therapy [facility] for tasks assessed/performed             Past Medical History:  Diagnosis Date   Breast cancer ([place])    Cancer ([place]) 2022   Breast Cancer   Diverticulosis    Dysrhythmia    Heart murmur    History of colonic polyps    History of DVT (deep vein thrombosis)    Right leg, April 2015   History of shingles    Mixed hyperlipidemia    Personal history of chemotherapy    Past Surgical History:  Procedure Laterality Date   BREAST LUMPECTOMY Right 04/09/2021   BREAST LUMPECTOMY WITH RADIOACTIVE SEED LOCALIZATION Right 04/09/2021   Procedure: RIGHT BREAST LUMPECTOMY WITH RADIOACTIVE SEED LOCALIZATION;  Surgeon: Coralie Keens, MD;  Location: [place];  Service: General;  Laterality: Right;   HERNIA REPAIR     MENISCUS REPAIR Left 03/2019   PORTACATH PLACEMENT Left 11/03/2020   Procedure: INSERTION PORT-A-CATH;  Surgeon: Coralie Keens, MD;  Location: [place];  Service: General;  Laterality: Left;   RADIOACTIVE SEED GUIDED AXILLARY SENTINEL LYMPH NODE Right 04/09/2021   Procedure: RADIOACTIVE SEED GUIDED RIGHT AXILLARY SENTINEL LYMPH NODE DISSECTION;  Surgeon: Coralie Keens, MD;  Location: [place];  Service: General;  Laterality: Right;   TONSILLECTOMY     Patient Active Problem List   Diagnosis Date Noted   Port-A-Cath in place 11/17/2020   Malignant neoplasm of upper-outer quadrant of right breast in female, estrogen receptor positive ([place]) 10/31/2020   Recurrent umbilical hernia 80/99/8338   DVT (deep  venous thrombosis) ([place]) 03/22/2019   Paroxysmal supraventricular tachycardia ([place]) 03/08/2014   Cardiac murmur 03/08/2014   Mixed hyperlipidemia 03/08/2014    PCP: Dr. Woody Seller  REFERRING PROVIDER: Dr. Lindi Adie  REFERRING DIAG: Rt shoulder pain.    THERAPY DIAG:  Aftercare following surgery for neoplasm  Abnormal posture  Malignant neoplasm of upper-outer quadrant of right breast in female, estrogen receptor positive ([place])  Acute pain of right shoulder  ONSET DATE: 04/09/21  Rationale for Evaluation and Treatment Rehabilitation  SUBJECTIVE  SUBJECTIVE STATEMENT: Ache is still present but not as bad. Has been able to lay on R shoulder some. Has been feeling pretty good though.  PERTINENT HISTORY:  R breast cancer with mets to axillary lymph node, completed neoadjuvant chemo currently on Herceptin, Rt breast lumpectomy and ALND 04/09/21 with 18 negative nodes removed.  Declined radiation.    PAIN:  Are you having pain? Yes - it aches at rest NPRS scale: 4/10  Pain location: side of the right shoulder and up into the neck  Pain orientation: Right  PAIN TYPE: aching and dull Pain description: constant  Aggravating factors: reaching out to the side, behind the back, lifting or carrying, sleeping Relieving factors: nothing   PRECAUTIONS: lymphedema risk   WEIGHT BEARING RESTRICTIONS No  FALLS:  Has patient fallen in last 6 months? No  OBJECTIVE  UPPER EXTREMITY AROM/PROM:  A/PROM RIGHT   03/14/21  10/29/21  Shoulder extension 89 30 - Rt UT pain   Shoulder flexion 161 130 - pull  Shoulder abduction 175 105 - pain posterior shoulder   Shoulder internal rotation    Shoulder external rotation 105     (Blank rows = not tested)  A/PROM LEFT   eval  Shoulder extension   Shoulder flexion    Shoulder abduction   Shoulder internal rotation   Shoulder external rotation     (Blank rows = not tested)   CERVICAL AROM: All within normal limits:     Flexion 55  Extension 30  Right lateral flexion 30  Left lateral flexion 20-pull  Right rotation 55  Left rotation 55      UPPER EXTREMITY MMT:  MMT Right eval Left eval  Shoulder flexion Pain in UT with resistance light 4/5  Shoulder extension 4/5 4/5  Shoulder abduction Push at around 45 deg- pain in Rt UT 4/5  Shoulder adduction    Shoulder extension    Shoulder internal rotation 4/5 4/5  Shoulder external rotation 4/5 4/5  Middle trapezius    Lower trapezius    Elbow flexion 5/5 5/5  Elbow extension    Wrist flexion    Wrist extension    Wrist ulnar deviation    Wrist radial deviation    Wrist pronation    Wrist supination    Grip strength 22# 38#   (Blank rows = not tested)   QUICK DASH SURVEY: 68%  TODAY'S TREATMENT                                     EXERCISE LOG  Exercise Repetitions and Resistance Comments  Pulley X5 min Tricep stretch felt  UBE 90 RPM x4 min (forward/backward)   Wall ladder  X2 min for longer holds at max Max 17-19  ER stretch 3x20 sec   AROM cervical rotation X15 reps   AROM cervical lateral lean X15 reps   UE ranger Add/abd x2 min, ER/IR x2 min    Blank cell = exercise not performed today    Manual Therapy  Soft Tissue Mobilization: R shoulder, STW to posterior cuff, deltoids to reduce pain (light pressure)   PATIENT EDUCATION:  Education details: POC, HEP Person educated: Patient Education method: Consulting civil engineer, Demonstration, Tactile cues, Verbal cues, and Handouts Education comprehension: verbalized understanding, returned demonstration, and needs further education  HOME EXERCISE PROGRAM: Access code Lost program consisted of: Rt upper trap stretch       Rt levator scapula  stretch       Wall flexion for latissimus  stretch       retractions  ASSESSMENT:  CLINICAL IMPRESSION: Patient presented in clinic with reports of less ache and pain that she has been experiencing. Pain and ache still present but not as intense as prior to PT. Patient introduced to new exercises but reported more pain with UBE and wall ladder today. Greater emphasis on AAROM stretching into ER and abduction. Less tone in R deltoids but still tender in mid deltoid region. Patient provided orange theraputty for grip training at home.   OBJECTIVE IMPAIRMENTS decreased activity tolerance, decreased mobility, decreased ROM, and pain.   ACTIVITY LIMITATIONS carrying, lifting, and sleeping  PARTICIPATION LIMITATIONS: cleaning, laundry, community activity, and occupation  PERSONAL FACTORS Age and 1-2 comorbidities: SLNB, lumpectomy hx  are also affecting patient's functional outcome.   REHAB POTENTIAL: Good  CLINICAL DECISION MAKING: Stable/uncomplicated  EVALUATION COMPLEXITY: Moderate  GOALS: Goals reviewed with patient? Yes   LONG TERM GOALS: Target date: 12/17/2021    Pt will improve Rt AROM to within 5 degrees of pre-operative baseline per above Baseline:  Goal status: INITIAL  2.  Pt will report resting pain of 0/10 Baseline:  Goal status: INITIAL  3.  Pt will report improved ability to sleep by at least 50% due to less pain Baseline:  Goal status: INITIAL  4.  Pt will be ind with final HEP Baseline:  Goal status: INITIAL  PLAN: PT FREQUENCY: 2x/week  PT DURATION: 6 weeks with continued SOZO screens (Don't need to DC episode when done)  PLANNED INTERVENTIONS: Therapeutic exercises, Patient/Family education, Self Care, Dry Needling, Ionotophoresis '4mg'$ /ml Dexamethasone, Manual therapy, and Re-evaluation  PLAN FOR NEXT SESSION: Rt upper quadrant STM, Rt shoulder PROM, consider DN and ionto when signed, add TE as able with less pain.  (No [place], heat)   Standley Brooking, PTA 11/05/2021, 11:24 AM

## 2021-11-05 NOTE — Progress Notes (Signed)
 Patient Care Team: Vyas, Dhruv B, MD as PCP - General (Internal Medicine) Gudena, Vinay, MD as Consulting Physician (Hematology and Oncology) Blackman, Douglas, MD as Consulting Physician (General Surgery)  DIAGNOSIS: No diagnosis found.  SUMMARY OF ONCOLOGIC HISTORY: Oncology History  Malignant neoplasm of upper-outer quadrant of right breast in female, estrogen receptor positive (HCC)  10/04/2020 Initial Diagnosis   Palpable lump in the right breast, mammogram detected 3.6 cm mass with several axillary lymph nodes, biopsy revealed grade 1-2 IDC, lymph nodes positive for cancer, ER 40%, PR 0%, Ki-67 15%, HER2 positive 3+ by IHC   10/31/2020 Cancer Staging   Staging form: Breast, AJCC 8th Edition - Clinical stage from 10/31/2020: Stage IIA (cT2, cN1, cM0, G2, ER+, PR-, HER2+) - Signed by Gudena, Vinay, MD on 10/31/2020 Stage prefix: Initial diagnosis Histologic grading system: 3 grade system   11/17/2020 - 03/01/2021 Chemotherapy   Patient is on Treatment Plan : BREAST  Docetaxel + Carboplatin + Trastuzumab + Pertuzumab  (TCHP) q21d  x 6     03/23/2021 -  Chemotherapy   Herceptin/Perjeta to complete one year of immunotherapy    04/09/2021 Surgery   Right lumpectomy and axillary node dissection: no residual cancer, 18 lymph nodes negative for cancer. ypT0,N0   04/09/2021 Cancer Staging   Staging form: Breast, AJCC 8th Edition - Pathologic stage from 04/09/2021: No Stage Recommended (ypT0, pN0, cM0) - Signed by Chapman, Lindsey Cornetto, NP on 07/26/2021 Stage prefix: Post-therapy   05/2021 -  Radiation Therapy   Declined adjuvant radiation therapy    06/14/2021 -  Anti-estrogen oral therapy   Letrozole daily     CHIEF COMPLIANT: Herceptin Perjeta maintenance on Letrozole    INTERVAL HISTORY: Lisa Chapman is a 76 y.o. with above-mentioned history of breast cancer, currently on chemotherapy with TCHP. She presents to the clinic today for treatment.    ALLERGIES:  is allergic to  other and boniva [ibandronic acid].  MEDICATIONS:  Current Outpatient Medications  Medication Sig Dispense Refill   lidocaine-prilocaine (EMLA) cream Apply to affected area once 30 g 3   rosuvastatin (CRESTOR) 20 MG tablet Take 20 mg by mouth daily.     sodium chloride (OCEAN) 0.65 % SOLN nasal spray Place 1 spray into both nostrils as needed for congestion.     XARELTO 20 MG TABS tablet TAKE 1 TABLET BY MOUTH DAILY WITH SUPPER. 30 tablet 11   No current facility-administered medications for this visit.    PHYSICAL EXAMINATION: ECOG PERFORMANCE STATUS: {CHL ONC ECOG PS:[id number]}  There were no vitals filed for this visit. There were no vitals filed for this visit.  BREAST:*** No palpable masses or nodules in either right or left breasts. No palpable axillary supraclavicular or infraclavicular adenopathy no breast tenderness or nipple discharge. (exam performed in the presence of a chaperone)  LABORATORY DATA:  I have reviewed the data as listed    Latest Ref Rng & Units 10/18/2021   10:35 AM 09/06/2021    8:32 AM 08/16/2021    8:10 AM  CMP  Glucose 70 - 99 mg/dL 101  99  97   BUN 8 - 23 mg/dL 14  18  12   Creatinine 0.44 - 1.00 mg/dL 0.72  0.72  0.81   Sodium 135 - 145 mmol/L 140  140  140   Potassium 3.5 - 5.1 mmol/L 4.1  4.1  3.9   Chloride 98 - 111 mmol/L 106  107  106   CO2 22 - 32 mmol/L   29  29  26   Calcium 8.9 - 10.3 mg/dL 9.8  9.2  9.6   Total Protein 6.5 - 8.1 g/dL 8.2  7.5  7.8   Total Bilirubin 0.3 - 1.2 mg/dL 0.3  0.3  0.5   Alkaline Phos 38 - 126 U/L 69  63  65   AST 15 - 41 U/L 16  15  16   ALT 0 - 44 U/L 9  8  8     Lab Results  Component Value Date   WBC 7.5 10/18/2021   HGB 12.3 10/18/2021   HCT 37.9 10/18/2021   MCV 83.1 10/18/2021   PLT 232 10/18/2021   NEUTROABS 4.5 10/18/2021    ASSESSMENT & PLAN:  No problem-specific Assessment & Plan notes found for this encounter.    No orders of the defined types were placed in this encounter.  The  patient has a good understanding of the overall plan. she agrees with it. she will call with any problems that may develop before the next visit here. Total time spent: 30 mins including face to face time and time spent for planning, charting and co-ordination of care   Lisa Chapman, CMA 11/05/21    I Lisa, Chapman am scribing for Dr. Gudena  ***  

## 2021-11-06 ENCOUNTER — Other Ambulatory Visit: Payer: Self-pay | Admitting: *Deleted

## 2021-11-06 DIAGNOSIS — C50411 Malignant neoplasm of upper-outer quadrant of right female breast: Secondary | ICD-10-CM

## 2021-11-07 ENCOUNTER — Ambulatory Visit: Payer: Medicare HMO | Admitting: Physical Therapy

## 2021-11-07 ENCOUNTER — Encounter: Payer: Self-pay | Admitting: Physical Therapy

## 2021-11-07 DIAGNOSIS — Z170 Estrogen receptor positive status [ER+]: Secondary | ICD-10-CM

## 2021-11-07 DIAGNOSIS — C50411 Malignant neoplasm of upper-outer quadrant of right female breast: Secondary | ICD-10-CM

## 2021-11-07 DIAGNOSIS — R293 Abnormal posture: Secondary | ICD-10-CM

## 2021-11-07 DIAGNOSIS — Z483 Aftercare following surgery for neoplasm: Secondary | ICD-10-CM | POA: Diagnosis not present

## 2021-11-07 DIAGNOSIS — M25511 Pain in right shoulder: Secondary | ICD-10-CM

## 2021-11-07 NOTE — Therapy (Signed)
OUTPATIENT PHYSICAL THERAPY ONCOLOGY TREATMENT  Patient Name: Lisa Chapman MRN: [id number] DOB:12/15/1945, 76 y.o., female Today's Date: 11/07/2021   PT End of Session - 11/07/21 1042     Visit Number 5    Number of Visits 14    Date for PT Re-Evaluation 12/10/21    PT Start Time 0946    PT Stop Time 1027    PT Time Calculation (min) 41 min    Activity Tolerance Patient tolerated treatment well    Behavior During Therapy [facility] for tasks assessed/performed             Past Medical History:  Diagnosis Date   Breast cancer ([place])    Cancer ([place]) 2022   Breast Cancer   Diverticulosis    Dysrhythmia    Heart murmur    History of colonic polyps    History of DVT (deep vein thrombosis)    Right leg, April 2015   History of shingles    Mixed hyperlipidemia    Personal history of chemotherapy    Past Surgical History:  Procedure Laterality Date   BREAST LUMPECTOMY Right 04/09/2021   BREAST LUMPECTOMY WITH RADIOACTIVE SEED LOCALIZATION Right 04/09/2021   Procedure: RIGHT BREAST LUMPECTOMY WITH RADIOACTIVE SEED LOCALIZATION;  Surgeon: Coralie Keens, MD;  Location: [place];  Service: General;  Laterality: Right;   HERNIA REPAIR     MENISCUS REPAIR Left 03/2019   PORTACATH PLACEMENT Left 11/03/2020   Procedure: INSERTION PORT-A-CATH;  Surgeon: Coralie Keens, MD;  Location: [place];  Service: General;  Laterality: Left;   RADIOACTIVE SEED GUIDED AXILLARY SENTINEL LYMPH NODE Right 04/09/2021   Procedure: RADIOACTIVE SEED GUIDED RIGHT AXILLARY SENTINEL LYMPH NODE DISSECTION;  Surgeon: Coralie Keens, MD;  Location: [place];  Service: General;  Laterality: Right;   TONSILLECTOMY     Patient Active Problem List   Diagnosis Date Noted   Port-A-Cath in place 11/17/2020   Malignant neoplasm of upper-outer quadrant of right breast in female, estrogen receptor positive ([place]) 10/31/2020   Recurrent umbilical hernia 05/02/2246   DVT (deep  venous thrombosis) ([place]) 03/22/2019   Paroxysmal supraventricular tachycardia ([place]) 03/08/2014   Cardiac murmur 03/08/2014   Mixed hyperlipidemia 03/08/2014    PCP: Dr. Woody Seller  REFERRING PROVIDER: Dr. Lindi Adie  REFERRING DIAG: Rt shoulder pain.    THERAPY DIAG:  Aftercare following surgery for neoplasm  Abnormal posture  Malignant neoplasm of upper-outer quadrant of right breast in female, estrogen receptor positive ([place])  Acute pain of right shoulder  ONSET DATE: 04/09/21  Rationale for Evaluation and Treatment Rehabilitation  SUBJECTIVE  SUBJECTIVE STATEMENT: Has been using putty at home and was able to lift 3/4 full gallon of milk this morning with RUE only.  PERTINENT HISTORY:  R breast cancer with mets to axillary lymph node, completed neoadjuvant chemo currently on Herceptin, Rt breast lumpectomy and ALND 04/09/21 with 18 negative nodes removed.  Declined radiation.    PAIN: No pain assessment provided  PRECAUTIONS: lymphedema risk   WEIGHT BEARING RESTRICTIONS No  FALLS:  Has patient fallen in last 6 months? No  OBJECTIVE  UPPER EXTREMITY AROM/PROM:  A/PROM RIGHT   03/14/21  10/29/21  Shoulder extension 89 30 - Rt UT pain   Shoulder flexion 161 130 - pull  Shoulder abduction 175 105 - pain posterior shoulder   Shoulder internal rotation    Shoulder external rotation 105     (Blank rows = not tested)  A/PROM LEFT   eval  Shoulder extension   Shoulder flexion   Shoulder abduction   Shoulder internal rotation   Shoulder external rotation     (Blank rows = not tested)   CERVICAL AROM: All within normal limits:     Flexion 55  Extension 30  Right lateral flexion 30  Left lateral flexion 20-pull  Right rotation 55  Left rotation 55      UPPER EXTREMITY MMT:  MMT  Right eval Left eval  Shoulder flexion Pain in UT with resistance light 4/5  Shoulder extension 4/5 4/5  Shoulder abduction Push at around 45 deg- pain in Rt UT 4/5  Shoulder adduction    Shoulder extension    Shoulder internal rotation 4/5 4/5  Shoulder external rotation 4/5 4/5  Middle trapezius    Lower trapezius    Elbow flexion 5/5 5/5  Elbow extension    Wrist flexion    Wrist extension    Wrist ulnar deviation    Wrist radial deviation    Wrist pronation    Wrist supination    Grip strength 22# 38#   (Blank rows = not tested)   QUICK DASH SURVEY: 68%  TODAY'S TREATMENT                                     EXERCISE LOG  Exercise Repetitions and Resistance Comments  [place] X5 min   Wall ladder  X3 min for longer holds at max Max 17-19  Wall pushups X15 reps   Resisted row Yellow x20 reps   Resisted extension Yellow x20 reps   UE ranger Add/abd x2 min, ER/IR x2 min    Blank cell = exercise not performed today    Manual Therapy  Soft Tissue Mobilization: R shoulder, STW to posterior cuff, deltoids to reduce pain (light pressure)   PATIENT EDUCATION:  Education details: POC, HEP Person educated: Patient Education method: Consulting civil engineer, Demonstration, Tactile cues, Verbal cues, and Handouts Education comprehension: verbalized understanding, returned demonstration, and needs further education  HOME EXERCISE PROGRAM: Access code Lost program consisted of: Rt upper trap stretch       Rt levator scapula stretch       Wall flexion for latissimus stretch       retractions  ASSESSMENT:  CLINICAL IMPRESSION: Patient presented in clinic with reports of feeling better overall and able to lift items better with only RUE. Patient progressed lightly with more resistance to continue building strength. Patient reports compliance with use of grip putty at home. Patient continues to have tenderness  along mid deltoid as well as at RTC distal attachment. Patient advised that  soreness may present following today's treatment due to resistance training.   OBJECTIVE IMPAIRMENTS decreased activity tolerance, decreased mobility, decreased ROM, and pain.   ACTIVITY LIMITATIONS carrying, lifting, and sleeping  PARTICIPATION LIMITATIONS: cleaning, laundry, community activity, and occupation  PERSONAL FACTORS Age and 1-2 comorbidities: SLNB, lumpectomy hx  are also affecting patient's functional outcome.   REHAB POTENTIAL: Good  CLINICAL DECISION MAKING: Stable/uncomplicated  EVALUATION COMPLEXITY: Moderate  GOALS: Goals reviewed with patient? Yes   LONG TERM GOALS: Target date: 12/19/2021    Pt will improve Rt AROM to within 5 degrees of pre-operative baseline per above Baseline:  Goal status: INITIAL  2.  Pt will report resting pain of 0/10 Baseline:  Goal status: INITIAL  3.  Pt will report improved ability to sleep by at least 50% due to less pain Baseline:  Goal status: INITIAL  4.  Pt will be ind with final HEP Baseline:  Goal status: INITIAL  PLAN: PT FREQUENCY: 2x/week  PT DURATION: 6 weeks with continued SOZO screens (Don't need to DC episode when done)  PLANNED INTERVENTIONS: Therapeutic exercises, Patient/Family education, Self Care, Dry Needling, Ionotophoresis '4mg'$ /ml Dexamethasone, Manual therapy, and Re-evaluation  PLAN FOR NEXT SESSION: Rt upper quadrant STM, Rt shoulder PROM, consider DN and ionto when signed, add TE as able with less pain.  (No [place], heat)   Standley Brooking, PTA 11/07/2021, 10:57 AM

## 2021-11-08 ENCOUNTER — Other Ambulatory Visit: Payer: Self-pay | Admitting: *Deleted

## 2021-11-08 ENCOUNTER — Inpatient Hospital Stay: Payer: Medicare HMO

## 2021-11-08 ENCOUNTER — Ambulatory Visit (HOSPITAL_BASED_OUTPATIENT_CLINIC_OR_DEPARTMENT_OTHER)
Admission: RE | Admit: 2021-11-08 | Discharge: 2021-11-08 | Disposition: A | Discharge status: Home / Self Care | Payer: Medicare HMO | Source: Ambulatory Visit | Attending: Hematology and Oncology | Admitting: Hematology and Oncology

## 2021-11-08 ENCOUNTER — Inpatient Hospital Stay: Payer: Medicare HMO | Admitting: Hematology and Oncology

## 2021-11-08 ENCOUNTER — Other Ambulatory Visit: Payer: Self-pay

## 2021-11-08 VITALS — BP 154/76 | HR 66 | Temp 97.3°F | Resp 18 | Ht 60.0 in | Wt 144.1 lb

## 2021-11-08 VITALS — BP 142/67 | HR 66 | Temp 98.3°F | Resp 18

## 2021-11-08 DIAGNOSIS — R2242 Localized swelling, mass and lump, left lower limb: Secondary | ICD-10-CM

## 2021-11-08 DIAGNOSIS — Z170 Estrogen receptor positive status [ER+]: Secondary | ICD-10-CM

## 2021-11-08 DIAGNOSIS — Z79811 Long term (current) use of aromatase inhibitors: Secondary | ICD-10-CM | POA: Diagnosis not present

## 2021-11-08 DIAGNOSIS — C50411 Malignant neoplasm of upper-outer quadrant of right female breast: Secondary | ICD-10-CM | POA: Diagnosis not present

## 2021-11-08 DIAGNOSIS — Z95828 Presence of other vascular implants and grafts: Secondary | ICD-10-CM

## 2021-11-08 DIAGNOSIS — Z5112 Encounter for antineoplastic immunotherapy: Secondary | ICD-10-CM | POA: Diagnosis not present

## 2021-11-08 LAB — CBC WITH DIFFERENTIAL (CANCER CENTER ONLY)
Abs Immature Granulocytes: 0.01 10*3/uL (ref 0.00–0.07)
Basophils Absolute: 0 10*3/uL (ref 0.0–0.1)
Basophils Relative: 1 %
Eosinophils Absolute: 0.1 10*3/uL (ref 0.0–0.5)
Eosinophils Relative: 2 %
HCT: 36.5 % (ref 36.0–46.0)
Hemoglobin: 11.9 g/dL — ABNORMAL LOW (ref 12.0–15.0)
Immature Granulocytes: 0 %
Lymphocytes Relative: 31 %
Lymphs Abs: 2.1 10*3/uL (ref 0.7–4.0)
MCH: 27.3 pg (ref 26.0–34.0)
MCHC: 32.6 g/dL (ref 30.0–36.0)
MCV: 83.7 fL (ref 80.0–100.0)
Monocytes Absolute: 0.6 10*3/uL (ref 0.1–1.0)
Monocytes Relative: 9 %
Neutro Abs: 3.8 10*3/uL (ref 1.7–7.7)
Neutrophils Relative %: 57 %
Platelet Count: 238 10*3/uL (ref 150–400)
RBC: 4.36 MIL/uL (ref 3.87–5.11)
RDW: 15.4 % (ref 11.5–15.5)
WBC Count: 6.6 10*3/uL (ref 4.0–10.5)
nRBC: 0 % (ref 0.0–0.2)

## 2021-11-08 LAB — CMP (CANCER CENTER ONLY)
ALT: 8 U/L (ref 0–44)
AST: 16 U/L (ref 15–41)
Albumin: 4.2 g/dL (ref 3.5–5.0)
Alkaline Phosphatase: 67 U/L (ref 38–126)
Anion gap: 8 (ref 5–15)
BUN: 14 mg/dL (ref 8–23)
CO2: 29 mmol/L (ref 22–32)
Calcium: 9.2 mg/dL (ref 8.9–10.3)
Chloride: 104 mmol/L (ref 98–111)
Creatinine: 0.68 mg/dL (ref 0.44–1.00)
GFR, Estimated: >60 mL/min (ref >60)
Glucose, Bld: 101 mg/dL — ABNORMAL HIGH (ref 70–99)
Potassium: 4.1 mmol/L (ref 3.5–5.1)
Sodium: 141 mmol/L (ref 135–145)
Total Bilirubin: 0.3 mg/dL (ref 0.3–1.2)
Total Protein: 7.9 g/dL (ref 6.5–8.1)

## 2021-11-08 MED ORDER — HEPARIN SOD (PORK) LOCK FLUSH 100 UNIT/ML IV SOLN
500.0000 [IU] | Freq: Once | INTRAVENOUS | Status: AC | PRN
  Administered 2021-11-08: 500 [IU]

## 2021-11-08 MED ORDER — ACETAMINOPHEN 325 MG PO TABS
650.0000 mg | ORAL_TABLET | Freq: Once | ORAL | Status: AC
  Administered 2021-11-08: 650 mg via ORAL
  Filled 2021-11-08: qty 2

## 2021-11-08 MED ORDER — TRASTUZUMAB-ANNS CHEMO 150 MG IV SOLR
6.0000 mg/kg | Freq: Once | INTRAVENOUS | Status: AC
  Administered 2021-11-08: 378 mg via INTRAVENOUS
  Filled 2021-11-08: qty 18

## 2021-11-08 MED ORDER — DIPHENHYDRAMINE HCL 25 MG PO CAPS
25.0000 mg | ORAL_CAPSULE | Freq: Once | ORAL | Status: AC
  Administered 2021-11-08: 25 mg via ORAL
  Filled 2021-11-08: qty 1

## 2021-11-08 MED ORDER — SODIUM CHLORIDE 0.9 % IV SOLN
420.0000 mg | Freq: Once | INTRAVENOUS | Status: AC
  Administered 2021-11-08: 420 mg via INTRAVENOUS
  Filled 2021-11-08: qty 14

## 2021-11-08 MED ORDER — SODIUM CHLORIDE 0.9% FLUSH
10.0000 mL | Freq: Once | INTRAVENOUS | Status: AC
  Administered 2021-11-08: 10 mL

## 2021-11-08 MED ORDER — SODIUM CHLORIDE 0.9% FLUSH
10.0000 mL | INTRAVENOUS | Status: DC | PRN
  Administered 2021-11-08: 10 mL

## 2021-11-08 MED ORDER — SODIUM CHLORIDE 0.9 % IV SOLN: Freq: Once | INTRAVENOUS | Status: AC

## 2021-11-08 NOTE — Assessment & Plan Note (Signed)
Palpable lump in the right breast, mammogram detected 3.6 cm mass with several axillary lymph nodes, biopsy revealed grade 1-2 IDC, lymph nodes positive for cancer, ER 40%, PR 0%, Ki-67 15%, HER2 positive 3+ by IHC  Treatment plan: 1. Neoadjuvant chemotherapy with TCH Perjeta 6 cycles followed by Herceptin Perjeta maintenance for 1 year 2. Followed bylumpectomy and axillary node dissection completed on April 09, 2021 consistent with complete response 3.Adjuvant radiation in [place] 4.Followed by antiestrogen therapywith letrozole 2.5 mg daily started 06/14/2021 __________________________________________________________________________________ Current treatment:Herceptin/Perjeta(to be completed 11/08/2021) Toxicities: Tolerating the treatment extremely well without any problems or concerns.  Denies any nausea vomiting or diarrhea.  Letrozoletoxicities: Skin itching on the hands: I recommended stopping letrozole therapy.  When we meet her in 3 weeks we can discuss if the itching will goes away to consider anastrozole.  This concludes Herceptin and Perjeta treatment. We will request for the port removal.  Return to clinic in 6 months for follow-up

## 2021-11-08 NOTE — Progress Notes (Signed)
Per pt request RN successfully faxed mammogram order to [facility]. Pt also complaint of left lower extremity pain and swelling. Verbal orders received from MD to obtain VAS [place] to r/o DVT.  Orders placed and appt scheduled.

## 2021-11-08 NOTE — Progress Notes (Signed)
Pt arrived to infusion clinic c/o lower left leg shooting pain at 10/10. Pt has hx of blood clots in left leg. MD notified. Pt scheduled for lower left leg [place] after tx today. Pt expressed understanding of plan.   Following tx, pt checked in to vascular US appointment and transported to main waiting area.

## 2021-11-08 NOTE — Progress Notes (Signed)
Left lower extremity venous duplex has been completed. Preliminary results can be found in CV Proc through chart review.  Results were given to [facility] at Dr. Geralyn Flash office.  11/08/21 12:11 PM Lisa Chapman RVT

## 2021-11-08 NOTE — Patient Instructions (Signed)
[facility] MEDICAL ONCOLOGY  Discharge Instructions: Thank you for choosing [facility] to provide your oncology and hematology care.   If you have a lab appointment with the Cancer Center, please go directly to the Cancer Center and check in at the registration area.   Wear comfortable clothing and clothing appropriate for easy access to any Portacath or PICC line.   We strive to give you quality time with your provider. You may need to reschedule your appointment if you arrive late (15 or more minutes).  Arriving late affects you and other patients whose appointments are after yours.  Also, if you miss three or more appointments without notifying the office, you may be dismissed from the clinic at the provider's discretion.      For prescription refill requests, have your pharmacy contact our office and allow 72 hours for refills to be completed.    Today you received the following chemotherapy and/or immunotherapy agents: Kanjinti, Pertuzumab.       To help prevent nausea and vomiting after your treatment, we encourage you to take your nausea medication as directed.  BELOW ARE SYMPTOMS THAT SHOULD BE REPORTED IMMEDIATELY: *FEVER GREATER THAN 100.4 F (38 C) OR HIGHER *CHILLS OR SWEATING *NAUSEA AND VOMITING THAT IS NOT CONTROLLED WITH YOUR NAUSEA MEDICATION *UNUSUAL SHORTNESS OF BREATH *UNUSUAL BRUISING OR BLEEDING *URINARY PROBLEMS (pain or burning when urinating, or frequent urination) *BOWEL PROBLEMS (unusual diarrhea, constipation, pain near the anus) TENDERNESS IN MOUTH AND THROAT WITH OR WITHOUT PRESENCE OF ULCERS (sore throat, sores in mouth, or a toothache) UNUSUAL RASH, SWELLING OR PAIN  UNUSUAL VAGINAL DISCHARGE OR ITCHING   Items with * indicate a potential emergency and should be followed up as soon as possible or go to the Emergency Department if any problems should occur.  Please show the CHEMOTHERAPY ALERT CARD or IMMUNOTHERAPY ALERT CARD at  check-in to the Emergency Department and triage nurse.  Should you have questions after your visit or need to cancel or reschedule your appointment, please contact [facility] MEDICAL ONCOLOGY  Dept: [phone]  and follow the prompts.  Office hours are 8:00 a.m. to 4:30 p.m. Monday - Friday. Please note that voicemails left after 4:00 p.m. may not be returned until the following business day.  We are closed weekends and major holidays. You have access to a nurse at all times for urgent questions. Please call the main number to the clinic Dept: [phone] and follow the prompts.   For any non-urgent questions, you may also contact your provider using MyChart. We now offer e-Visits for anyone 18 and older to request care online for non-urgent symptoms. For details visit [email or web address].   Also download the MyChart app! Go to the app store, search "MyChart", open the app, select [facility], and log in with your MyChart username and password.  Masks are optional in the cancer centers. If you would like for your care team to wear a mask while they are taking care of you, please let them know. You may have one support person who is at least 74 years old accompany you for your appointments. 

## 2021-11-09 ENCOUNTER — Telehealth: Payer: Self-pay | Admitting: Hematology and Oncology

## 2021-11-09 ENCOUNTER — Encounter: Payer: Self-pay | Admitting: *Deleted

## 2021-11-09 DIAGNOSIS — Z170 Estrogen receptor positive status [ER+]: Secondary | ICD-10-CM

## 2021-11-09 NOTE — Telephone Encounter (Signed)
Spoke with patient confirming 4/2 appointment.

## 2021-11-12 ENCOUNTER — Encounter: Payer: Self-pay | Admitting: Physical Therapy

## 2021-11-12 ENCOUNTER — Ambulatory Visit: Payer: Medicare HMO | Attending: Surgery | Admitting: Physical Therapy

## 2021-11-12 DIAGNOSIS — R293 Abnormal posture: Secondary | ICD-10-CM | POA: Diagnosis not present

## 2021-11-12 DIAGNOSIS — C50411 Malignant neoplasm of upper-outer quadrant of right female breast: Secondary | ICD-10-CM | POA: Insufficient documentation

## 2021-11-12 DIAGNOSIS — Z170 Estrogen receptor positive status [ER+]: Secondary | ICD-10-CM | POA: Diagnosis not present

## 2021-11-12 DIAGNOSIS — Z483 Aftercare following surgery for neoplasm: Secondary | ICD-10-CM | POA: Diagnosis not present

## 2021-11-12 DIAGNOSIS — M25511 Pain in right shoulder: Secondary | ICD-10-CM | POA: Diagnosis not present

## 2021-11-12 NOTE — Therapy (Addendum)
 OUTPATIENT PHYSICAL THERAPY ONCOLOGY TREATMENT  Patient Name: Lisa Chapman MRN: [id number] DOB:09-Nov-1945, 76 y.o., female Today's Date: 11/12/2021   PT End of Session - 11/12/21 0855     Visit Number 6    Number of Visits 14    Date for PT Re-Evaluation 12/10/21    PT Start Time 0900    PT Stop Time 0944    PT Time Calculation (min) 44 min    Activity Tolerance Patient tolerated treatment well    Behavior During Therapy [facility] for tasks assessed/performed             Past Medical History:  Diagnosis Date   Breast cancer (HCC)    Cancer (HCC) 2022   Breast Cancer   Diverticulosis    Dysrhythmia    Heart murmur    History of colonic polyps    History of DVT (deep vein thrombosis)    Right leg, April 2015   History of shingles    Mixed hyperlipidemia    Personal history of chemotherapy    Past Surgical History:  Procedure Laterality Date   BREAST LUMPECTOMY Right 04/09/2021   BREAST LUMPECTOMY WITH RADIOACTIVE SEED LOCALIZATION Right 04/09/2021   Procedure: RIGHT BREAST LUMPECTOMY WITH RADIOACTIVE SEED LOCALIZATION;  Surgeon: Vernetta Berg, MD;  Location: [facility];  Service: General;  Laterality: Right;   HERNIA REPAIR     MENISCUS REPAIR Left 03/2019   PORTACATH PLACEMENT Left 11/03/2020   Procedure: INSERTION PORT-A-CATH;  Surgeon: Vernetta Berg, MD;  Location: [facility];  Service: General;  Laterality: Left;   RADIOACTIVE SEED GUIDED AXILLARY SENTINEL LYMPH NODE Right 04/09/2021   Procedure: RADIOACTIVE SEED GUIDED RIGHT AXILLARY SENTINEL LYMPH NODE DISSECTION;  Surgeon: Vernetta Berg, MD;  Location: [facility];  Service: General;  Laterality: Right;   TONSILLECTOMY     Patient Active Problem List   Diagnosis Date Noted   Port-A-Cath in place 11/17/2020   Malignant neoplasm of upper-outer quadrant of right breast in female, estrogen receptor positive (HCC) 10/31/2020   Recurrent umbilical hernia 11/25/2019   DVT (deep  venous thrombosis) (HCC) 03/22/2019   Paroxysmal supraventricular tachycardia 03/08/2014   Cardiac murmur 03/08/2014   Mixed hyperlipidemia 03/08/2014    PCP: Dr. Rosamond  REFERRING PROVIDER: Dr. Odean  REFERRING DIAG: Rt shoulder pain.    THERAPY DIAG:  Aftercare following surgery for neoplasm  Abnormal posture  Malignant neoplasm of upper-outer quadrant of right breast in female, estrogen receptor positive (HCC)  Acute pain of right shoulder  ONSET DATE: 04/09/21  Rationale for Evaluation and Treatment Rehabilitation  SUBJECTIVE  SUBJECTIVE STATEMENT: Can tell a big difference with her shoulder now and can pretty much lift whatever she wants. Has port removed tomorrow.  PERTINENT HISTORY:  R breast cancer with mets to axillary lymph node, completed neoadjuvant chemo currently on Herceptin , Rt breast lumpectomy and ALND 04/09/21 with 18 negative nodes removed.  Declined radiation.    PAIN: No pain assessment provided  PRECAUTIONS: lymphedema risk   WEIGHT BEARING RESTRICTIONS No  FALLS:  Has patient fallen in last 6 months? No  OBJECTIVE  UPPER EXTREMITY AROM/PROM:  A/PROM RIGHT   03/14/21  10/29/21  Shoulder extension 89 30 - Rt UT pain   Shoulder flexion 161 130 - pull  Shoulder abduction 175 105 - pain posterior shoulder   Shoulder internal rotation    Shoulder external rotation 105     (Blank rows = not tested)  A/PROM LEFT   eval  Shoulder extension   Shoulder flexion   Shoulder abduction   Shoulder internal rotation   Shoulder external rotation     (Blank rows = not tested)   CERVICAL AROM: All within normal limits:     Flexion 55  Extension 30  Right lateral flexion 30  Left lateral flexion 20-pull  Right rotation 55  Left rotation 55      UPPER EXTREMITY  MMT:  MMT Right eval Left eval  Shoulder flexion Pain in UT with resistance light 4/5  Shoulder extension 4/5 4/5  Shoulder abduction Push at around 45 deg- pain in Rt UT 4/5  Shoulder adduction    Shoulder extension    Shoulder internal rotation 4/5 4/5  Shoulder external rotation 4/5 4/5  Middle trapezius    Lower trapezius    Elbow flexion 5/5 5/5  Elbow extension    Wrist flexion    Wrist extension    Wrist ulnar deviation    Wrist radial deviation    Wrist pronation    Wrist supination    Grip strength 22# 38#   (Blank rows = not tested)   QUICK DASH SURVEY: 68%  TODAY'S TREATMENT                                     EXERCISE LOG  Exercise Repetitions and Resistance Comments  Pulley X5 min   Flexion stretch X15 reps 5 sec holds   Corner stretch X5 reps 20 sec holds   Wall pushups X15 reps   Resisted row Yellow x20 reps   Resisted ER Yellow x20 reps   Resisted IR Yellow x20 reps   Resisted protraction Yellow x20 reps   UE ranger Flex/ext x2 min, Add/abd x2 min, ER/IR x2 min    Blank cell = exercise not performed today    Manual Therapy  Soft Tissue Mobilization: R shoulder, STW to posterior cuff, deltoids to reduce pain (light pressure)   PATIENT EDUCATION:  Education details: POC, HEP Person educated: Patient Education method: Programmer, Multimedia, Demonstration, Tactile cues, Verbal cues, and Handouts Education comprehension: verbalized understanding, returned demonstration, and needs further education  HOME EXERCISE PROGRAM: Access code Lost program consisted of: Rt upper trap stretch       Rt levator scapula stretch       Wall flexion for latissimus stretch       retractions  ASSESSMENT:  CLINICAL IMPRESSION: Patient presented in clinic with reports of less overall pain since beginning PT. Patient able to tell a notable difference with ADLs.  Patient progressed through continued ROM/stretching along with progression of light strengthening with no complaints.  Minimal tone and soreness palpable in the mid to anterior R deltoids. Patient reports little to no limitation of lifting currently at home.   OBJECTIVE IMPAIRMENTS decreased activity tolerance, decreased mobility, decreased ROM, and pain.   ACTIVITY LIMITATIONS carrying, lifting, and sleeping  PARTICIPATION LIMITATIONS: cleaning, laundry, community activity, and occupation  PERSONAL FACTORS Age and 1-2 comorbidities: SLNB, lumpectomy hx are also affecting patient's functional outcome.   REHAB POTENTIAL: Good  CLINICAL DECISION MAKING: Stable/uncomplicated  EVALUATION COMPLEXITY: Moderate  GOALS: Goals reviewed with patient? Yes   LONG TERM GOALS: Target date: 12/24/2021    Pt will improve Rt AROM to within 5 degrees of pre-operative baseline per above Baseline:  Goal status: INITIAL  2.  Pt will report resting pain of 0/10 Baseline:  Goal status: INITIAL  3.  Pt will report improved ability to sleep by at least 50% due to less pain Baseline:  Goal status: INITIAL  4.  Pt will be ind with final HEP Baseline:  Goal status: INITIAL  PLAN: PT FREQUENCY: 2x/week  PT DURATION: 6 weeks with continued SOZO screens (Don't need to DC episode when done)  PLANNED INTERVENTIONS: Therapeutic exercises, Patient/Family education, Self Care, Dry Needling, Ionotophoresis 4mg /ml Dexamethasone , Manual therapy, and Re-evaluation  PLAN FOR NEXT SESSION: Rt upper quadrant STM, Rt shoulder PROM, consider DN and ionto when signed, add TE as able with less pain.  (No US , heat)   Larraine SHAUNNA Dame, PTA 11/12/2021, 9:56 AM  PHYSICAL THERAPY DISCHARGE SUMMARY  Visits from Start of Care: 6  Current functional level related to goals / functional outcomes: Per above   Remaining deficits: Lymphedema risk    Education / Equipment: Per above   Plan: Patient agrees to discharge.     Saddie Raw, PT 12/10/23, 9:45 AM

## 2021-11-13 DIAGNOSIS — Z452 Encounter for adjustment and management of vascular access device: Secondary | ICD-10-CM | POA: Diagnosis not present

## 2021-11-13 DIAGNOSIS — Z853 Personal history of malignant neoplasm of breast: Secondary | ICD-10-CM | POA: Diagnosis not present

## 2021-11-14 ENCOUNTER — Encounter: Payer: Medicare HMO | Admitting: Physical Therapy

## 2021-11-23 ENCOUNTER — Telehealth: Payer: Self-pay | Admitting: Adult Health

## 2021-11-23 DIAGNOSIS — Z1331 Encounter for screening for depression: Secondary | ICD-10-CM | POA: Diagnosis not present

## 2021-11-23 DIAGNOSIS — Z0000 Encounter for general adult medical examination without abnormal findings: Secondary | ICD-10-CM | POA: Diagnosis not present

## 2021-11-23 DIAGNOSIS — E7800 Pure hypercholesterolemia, unspecified: Secondary | ICD-10-CM | POA: Diagnosis not present

## 2021-11-23 DIAGNOSIS — Z299 Encounter for prophylactic measures, unspecified: Secondary | ICD-10-CM | POA: Diagnosis not present

## 2021-11-23 DIAGNOSIS — Z79899 Other long term (current) drug therapy: Secondary | ICD-10-CM | POA: Diagnosis not present

## 2021-11-23 DIAGNOSIS — I82409 Acute embolism and thrombosis of unspecified deep veins of unspecified lower extremity: Secondary | ICD-10-CM | POA: Diagnosis not present

## 2021-11-23 DIAGNOSIS — Z1339 Encounter for screening examination for other mental health and behavioral disorders: Secondary | ICD-10-CM | POA: Diagnosis not present

## 2021-11-23 DIAGNOSIS — C50911 Malignant neoplasm of unspecified site of right female breast: Secondary | ICD-10-CM | POA: Diagnosis not present

## 2021-11-23 DIAGNOSIS — R5383 Other fatigue: Secondary | ICD-10-CM | POA: Diagnosis not present

## 2021-11-23 DIAGNOSIS — I10 Essential (primary) hypertension: Secondary | ICD-10-CM | POA: Diagnosis not present

## 2021-11-23 DIAGNOSIS — Z6825 Body mass index (BMI) 25.0-25.9, adult: Secondary | ICD-10-CM | POA: Diagnosis not present

## 2021-11-23 DIAGNOSIS — D6869 Other thrombophilia: Secondary | ICD-10-CM | POA: Diagnosis not present

## 2021-11-23 DIAGNOSIS — Z7189 Other specified counseling: Secondary | ICD-10-CM | POA: Diagnosis not present

## 2021-11-23 NOTE — Telephone Encounter (Signed)
Scheduled appointment per 10/12 staff message. Unable to leave a voicemail due to mailbox being full. Patient will be mailed an updated calendar.

## 2021-12-19 DIAGNOSIS — Z170 Estrogen receptor positive status [ER+]: Secondary | ICD-10-CM | POA: Diagnosis not present

## 2021-12-19 DIAGNOSIS — Z853 Personal history of malignant neoplasm of breast: Secondary | ICD-10-CM | POA: Diagnosis not present

## 2021-12-19 DIAGNOSIS — C50411 Malignant neoplasm of upper-outer quadrant of right female breast: Secondary | ICD-10-CM | POA: Diagnosis not present

## 2021-12-26 DIAGNOSIS — I10 Essential (primary) hypertension: Secondary | ICD-10-CM | POA: Diagnosis not present

## 2021-12-26 DIAGNOSIS — M25511 Pain in right shoulder: Secondary | ICD-10-CM | POA: Diagnosis not present

## 2021-12-26 DIAGNOSIS — Z299 Encounter for prophylactic measures, unspecified: Secondary | ICD-10-CM | POA: Diagnosis not present

## 2021-12-31 DIAGNOSIS — M25511 Pain in right shoulder: Secondary | ICD-10-CM | POA: Diagnosis not present

## 2022-01-07 ENCOUNTER — Ambulatory Visit: Payer: Medicare HMO

## 2022-01-18 DIAGNOSIS — M25511 Pain in right shoulder: Secondary | ICD-10-CM | POA: Diagnosis not present

## 2022-01-21 DIAGNOSIS — M25511 Pain in right shoulder: Secondary | ICD-10-CM | POA: Diagnosis not present

## 2022-01-21 DIAGNOSIS — I10 Essential (primary) hypertension: Secondary | ICD-10-CM | POA: Diagnosis not present

## 2022-01-21 DIAGNOSIS — Z299 Encounter for prophylactic measures, unspecified: Secondary | ICD-10-CM | POA: Diagnosis not present

## 2022-01-29 ENCOUNTER — Other Ambulatory Visit: Payer: Self-pay | Admitting: Pharmacist

## 2022-02-08 ENCOUNTER — Other Ambulatory Visit: Payer: Self-pay

## 2022-02-08 ENCOUNTER — Inpatient Hospital Stay: Payer: Medicare HMO | Attending: Adult Health | Admitting: Adult Health

## 2022-02-08 ENCOUNTER — Encounter: Payer: Self-pay | Admitting: Adult Health

## 2022-02-08 VITALS — BP 139/67 | HR 63 | Temp 97.7°F | Resp 18 | Ht 60.0 in | Wt 146.7 lb

## 2022-02-08 DIAGNOSIS — I479 Paroxysmal tachycardia, unspecified: Secondary | ICD-10-CM | POA: Diagnosis not present

## 2022-02-08 DIAGNOSIS — Z79899 Other long term (current) drug therapy: Secondary | ICD-10-CM | POA: Insufficient documentation

## 2022-02-08 DIAGNOSIS — Z79811 Long term (current) use of aromatase inhibitors: Secondary | ICD-10-CM | POA: Insufficient documentation

## 2022-02-08 DIAGNOSIS — C50411 Malignant neoplasm of upper-outer quadrant of right female breast: Secondary | ICD-10-CM | POA: Insufficient documentation

## 2022-02-08 DIAGNOSIS — Z170 Estrogen receptor positive status [ER+]: Secondary | ICD-10-CM | POA: Insufficient documentation

## 2022-02-08 DIAGNOSIS — M7500 Adhesive capsulitis of unspecified shoulder: Secondary | ICD-10-CM | POA: Diagnosis not present

## 2022-02-08 DIAGNOSIS — Z9221 Personal history of antineoplastic chemotherapy: Secondary | ICD-10-CM | POA: Insufficient documentation

## 2022-02-08 NOTE — Progress Notes (Signed)
SURVIVORSHIP VISIT:  BRIEF ONCOLOGIC HISTORY:  Oncology History  Malignant neoplasm of upper-outer quadrant of right breast in female, estrogen receptor positive ([place])  10/04/2020 Initial Diagnosis   Palpable lump in the right breast, mammogram detected 3.6 cm mass with several axillary lymph nodes, biopsy revealed grade 1-2 IDC, lymph nodes positive for cancer, ER 40%, PR 0%, Ki-67 15%, HER2 positive 3+ by [facility]   10/31/2020 Cancer Staging   Staging form: Breast, AJCC 8th Edition - Clinical stage from 10/31/2020: Stage IIA (cT2, cN1, cM0, G2, ER+, PR-, HER2+) - Signed by Nicholas Lose, MD on 10/31/2020 Stage prefix: Initial diagnosis Histologic grading system: 3 grade system   11/17/2020 - 03/01/2021 Chemotherapy   Patient is on Treatment Plan : BREAST  Docetaxel + Carboplatin + Trastuzumab + Pertuzumab  (TCHP) q21d  x 6     03/23/2021 -  Chemotherapy   Herceptin/Perjeta to complete one year of immunotherapy    04/09/2021 Surgery   Right lumpectomy and axillary node dissection: no residual cancer, 18 lymph nodes negative for cancer. ypT0,N0   04/09/2021 Cancer Staging   Staging form: Breast, AJCC 8th Edition - Pathologic stage from 04/09/2021: No Stage Recommended (ypT0, pN0, cM0) - Signed by Gardenia Phlegm, NP on 07/26/2021 Stage prefix: Post-therapy   05/2021 -  Radiation Therapy   Declined adjuvant radiation therapy    06/14/2021 -  Anti-estrogen oral therapy   Letrozole daily     INTERVAL HISTORY:  Ms. Bitterman to review her survivorship care plan detailing her treatment course for breast cancer, as well as monitoring long-term side effects of that treatment, education regarding health maintenance, screening, and overall wellness and health promotion.     Overall, Ms. Dancel reports feeling quite well.  She is taking anastrozole daily with good tolerance.  She is tolerated it well.  Cheyna is experiencing frozen shoulders bilaterally.  She was seen by Dr. Baron Hamper and received a  steroid injection that didn't help.  MRIs were ordered but insurance would not pay. Dr. Woody Seller prescribed prednisone which helped loosen up her arms.  Joice still has some pain in her shoulders, particularly inside the joint.    Dekota has noticed that over the past couple of months she has had episodes where she will feel funny and will check her blood pressure and her heart rate will be at 170.  This does not last for very long time and her symptoms do subside however she wanted to bring this up as we discussed the cardiac effects of Herceptin and potential late term effects that can come from this.  She tells me that she has not had this before and is unsure what she should do about it.  REVIEW OF SYSTEMS:  Review of Systems  Constitutional:  Negative for appetite change, chills, fatigue, fever and unexpected weight change.  HENT:   Negative for hearing loss, lump/mass and trouble swallowing.   Eyes:  Negative for eye problems and icterus.  Respiratory:  Negative for chest tightness, cough and shortness of breath.   Cardiovascular:  Positive for palpitations (See interval history). Negative for chest pain and leg swelling.  Gastrointestinal:  Negative for abdominal distention, abdominal pain, constipation, diarrhea, nausea and vomiting.  Endocrine: Negative for hot flashes.  Genitourinary:  Negative for difficulty urinating.   Musculoskeletal:  Positive for arthralgias (Shoulder stiffness see interval history).  Skin:  Negative for itching and rash.  Neurological:  Negative for dizziness, extremity weakness, headaches and numbness.  Hematological:  Negative for adenopathy. Does  not bruise/bleed easily.  Psychiatric/Behavioral:  Negative for depression. The patient is not nervous/anxious.   Breast: Denies any new nodularity, masses, tenderness, nipple changes, or nipple discharge.      PAST MEDICAL/SURGICAL HISTORY:  Past Medical History:  Diagnosis Date   Breast cancer ([place])    Cancer  ([place]) 2022   Breast Cancer   Diverticulosis    Dysrhythmia    Heart murmur    History of colonic polyps    History of DVT (deep vein thrombosis)    Right leg, April 2015   History of shingles    Mixed hyperlipidemia    Personal history of chemotherapy    Port-A-Cath in place 11/17/2020   Past Surgical History:  Procedure Laterality Date   BREAST LUMPECTOMY Right 04/09/2021   BREAST LUMPECTOMY WITH RADIOACTIVE SEED LOCALIZATION Right 04/09/2021   Procedure: RIGHT BREAST LUMPECTOMY WITH RADIOACTIVE SEED LOCALIZATION;  Surgeon: Coralie Keens, MD;  Location: [facility];  Service: General;  Laterality: Right;   HERNIA REPAIR     MENISCUS REPAIR Left 03/2019   PORTACATH PLACEMENT Left 11/03/2020   Procedure: INSERTION PORT-A-CATH;  Surgeon: Coralie Keens, MD;  Location: [place];  Service: General;  Laterality: Left;   RADIOACTIVE SEED GUIDED AXILLARY SENTINEL LYMPH NODE Right 04/09/2021   Procedure: RADIOACTIVE SEED GUIDED RIGHT AXILLARY SENTINEL LYMPH NODE DISSECTION;  Surgeon: Coralie Keens, MD;  Location: [place] [facility];  Service: General;  Laterality: Right;   TONSILLECTOMY       ALLERGIES:  Allergies  Allergen Reactions   Other Hives    Seasoning in Glory food product. Unsure of exactly what spice.    Boniva [Ibandronic Acid] Other (See Comments)    Epigastric discomfort     CURRENT MEDICATIONS:  Outpatient Encounter Medications as of 02/08/2022  Medication Sig   anastrozole (ARIMIDEX) 1 MG tablet Take 1 mg by mouth daily.   rosuvastatin (CRESTOR) 20 MG tablet Take 20 mg by mouth daily.   XARELTO 20 MG TABS tablet TAKE 1 TABLET BY MOUTH DAILY WITH SUPPER.   hydrOXYzine (ATARAX) 25 MG tablet Take 25 mg by mouth 2 (two) times daily as needed. (Patient not taking: Reported on 02/08/2022)   sodium chloride (OCEAN) 0.65 % SOLN nasal spray Place 1 spray into both nostrils as needed for congestion. (Patient not taking: Reported on 02/08/2022)    triamcinolone cream (KENALOG) 0.1 % SMARTSIG:1 Gram(s) Topical Twice Daily PRN (Patient not taking: Reported on 02/08/2022)   No facility-administered encounter medications on file as of 02/08/2022.     ONCOLOGIC FAMILY HISTORY:  Family History  Problem Relation Age of Onset   Heart attack Mother    COPD Father    Heart attack Father    Emphysema Father     SOCIAL HISTORY:  Social History   Socioeconomic History   Marital status: Married    Spouse name: Not on file   Number of children: Not on file   Years of education: Not on file   Highest education level: Not on file  Occupational History   Not on file  Tobacco Use   Smoking status: Never   Smokeless tobacco: Never  Vaping Use   Vaping Use: Never used  Substance and Sexual Activity   Alcohol use: No    Alcohol/week: 0.0 standard drinks of alcohol   Drug use: No   Sexual activity: Not on file  Other Topics Concern   Not on file  Social History Narrative   Married. No children  Social Determinants of Health   Financial Resource Strain: Not on file  Food Insecurity: Not on file  Transportation Needs: Not on file  Physical Activity: Not on file  Stress: Not on file  Social Connections: Not on file  Intimate Partner Violence: Not on file     OBSERVATIONS/OBJECTIVE:  BP 139/67 (BP Location: Left Arm, Patient Position: Sitting)   Pulse 63   Temp 97.7 F (36.5 C) (Tympanic)   Resp 18   Ht 5' (1.524 m)   Wt 146 lb 11.2 oz (66.5 kg)   SpO2 100%   BMI 28.65 kg/m  GENERAL: Patient is a well appearing female in no acute distress HEENT:  Sclerae anicteric.  Oropharynx clear and moist. No ulcerations or evidence of oropharyngeal candidiasis. Neck is supple.  NODES:  No cervical, supraclavicular, or axillary lymphadenopathy palpated.  BREAST EXAM: Right breast status postlumpectomy and radiation no sign of local recurrence left breast is benign. LUNGS:  Clear to auscultation bilaterally.  No wheezes or  rhonchi. HEART:  Regular rate and rhythm. No murmur appreciated. ABDOMEN:  Soft, nontender.  Positive, normoactive bowel sounds. No organomegaly palpated. MSK:  No focal spinal tenderness to palpation. Full range of motion bilaterally in the upper extremities. EXTREMITIES:  No peripheral edema.   SKIN:  Clear with no obvious rashes or skin changes. No nail dyscrasia. NEURO:  Nonfocal. Well oriented.  Appropriate affect.  LABORATORY DATA:  None for this visit.  DIAGNOSTIC IMAGING:  CLINICAL DATA:  76 year old female for annual follow-up. History of RIGHT breast cancer and lumpectomy in 2022.  EXAM: DIGITAL DIAGNOSTIC BILATERAL MAMMOGRAM WITH TOMOSYNTHESIS  TECHNIQUE: Bilateral digital diagnostic mammography and breast tomosynthesis was performed.  COMPARISON:  Previous exam(s).  ACR Breast Density Category b: There are scattered areas of fibroglandular density.  FINDINGS: 2D and 3D full field views of both breasts and a magnification view of the lumpectomy site demonstrate no suspicious mass, nonsurgical distortion or worrisome calcifications.  Surgical changes within the RIGHT breast and axilla are again noted. Exam End: 12/19/21 10:50   Specimen Collected: 12/19/21 10:54 Last Resulted: 12/19/21 11:02  Received From: [facility]  Result Received: 12/21/21 15:07       ASSESSMENT AND PLAN:  Ms.. Curfman is a pleasant 76 y.o. female with Stage IIA right breast invasive ductal carcinoma, ER+/PR-/HER2+, diagnosed in 10/2020, treated with neoadjuvant chemotherapy, lumpectomy, maintenance Trastuzumab, declined adjuvant radiation therapy, and anti-estrogen therapy with Anastrozole beginning in 06/2022.  She presents to the Survivorship Clinic for our initial meeting and routine follow-up post-completion of treatment for breast cancer.    1. Stage IIA right breast cancer:  Ms. Krus is continuing to recover from definitive treatment for breast cancer. She will follow-up with her  medical oncologist, Dr. Lindi Adie in 6 months with history and physical exam per surveillance protocol.  She will continue her anti-estrogen therapy with Anastrozole. Thus far, she is tolerating the Anastrozole well, with minimal side effects. She was instructed to make Dr. Lindi Adie or myself aware if she begins to experience any worsening side effects of the medication and I could see her back in clinic to help manage those side effects, as needed. Her mammogram is due 05/2022; orders placed today. Today, a comprehensive survivorship care plan and treatment summary was reviewed with the patient today detailing her breast cancer diagnosis, treatment course, potential late/long-term effects of treatment, appropriate follow-up care with recommendations for the future, and patient education resources.  A copy of this summary, along with a letter will  be sent to the patient's primary care provider via mail/fax/In Basket message after today's visit.    2. Frozen shoulder: I gave her exercises to do at home while she is waiting to get in with PT.  She will continue to f/u with Dr. French Ana who is working with her on this.   3. Paroxysmal Tachycardia: I placed a referral to Dr. Harl Bowie in cardiology for further evaluation of this concern.  4. Bone health:  Given Ms. Mcjunkin's age/history of breast cancer and her current treatment regimen including anti-estrogen therapy with Anastrozole, she is at risk for bone demineralization.  Her last DEXA scan was about three years ago with Dr. Woody Seller, and we are in the process of obtaining those records.  I recommended that Ayden continue to see her primary care provider for bone density testing that way we can determine if there is any decline related to the anastrozole.  She was given education on specific activities to promote bone health.  5. Cancer screening:  Due to Ms. Severs's history and her age, she should receive screening for skin cancers, colon cancer.  The information and  recommendations are listed on the patient's comprehensive care plan/treatment summary and were reviewed in detail with the patient.    7. Health maintenance and wellness promotion: Ms. Schoon was encouraged to consume 5-7 servings of fruits and vegetables per day. We reviewed the "Nutrition Rainbow" handout.  She was also encouraged to engage in moderate to vigorous exercise for 30 minutes per day most days of the week. We discussed the [employer] fitness program, which is designed for cancer survivors to help them become more physically fit after cancer treatments.  She was instructed to limit her alcohol consumption and continue to abstain from tobacco use.     8. Support services/counseling: It is not uncommon for this period of the patient's cancer care trajectory to be one of many emotions and stressors.   She was given information regarding our available services and encouraged to contact me with any questions or for help enrolling in any of our support group/programs.    Follow up instructions:    -Return to cancer center in 6 months for f/u with Dr. Lindi Adie  -Mammogram due 12/2022 (completes at [facility] in [place]) -She is welcome to return back to the Survivorship Clinic at any time; no additional follow-up needed at this time.  -Consider referral back to survivorship as a long-term survivor for continued surveillance  The patient was provided an opportunity to ask questions and all were answered. The patient agreed with the plan and demonstrated an understanding of the instructions.   Total encounter time:40 minutes*in face-to-face visit time, chart review, lab review, care coordination, order entry, and documentation of the encounter time.    Wilber Bihari, NP 02/08/22 9:57 AM Medical Oncology and Hematology [facility] [place], [place] [address] Tel. [phone]    Fax. [phone]  *Total Encounter Time as defined by the Centers for Medicare  and Medicaid Services includes, in addition to the face-to-face time of a patient visit (documented in the note above) non-face-to-face time: obtaining and reviewing outside history, ordering and reviewing medications, tests or procedures, care coordination (communications with other health care professionals or caregivers) and documentation in the medical record.

## 2022-02-08 NOTE — Progress Notes (Signed)
Called Pts PCP, Dr. Jerene Bears of [place]. Requested vaccine, health maintenance, and last wellness visit records. Fax number given.

## 2022-02-12 DIAGNOSIS — M25512 Pain in left shoulder: Secondary | ICD-10-CM | POA: Diagnosis not present

## 2022-02-12 DIAGNOSIS — M25511 Pain in right shoulder: Secondary | ICD-10-CM | POA: Diagnosis not present

## 2022-02-13 ENCOUNTER — Telehealth: Payer: Self-pay | Admitting: Adult Health

## 2022-02-13 NOTE — Telephone Encounter (Signed)
Scheduled appointment per 1/2 los. Patient is aware. 

## 2022-02-14 DIAGNOSIS — M25512 Pain in left shoulder: Secondary | ICD-10-CM | POA: Diagnosis not present

## 2022-02-14 DIAGNOSIS — I10 Essential (primary) hypertension: Secondary | ICD-10-CM | POA: Diagnosis not present

## 2022-02-14 DIAGNOSIS — Z299 Encounter for prophylactic measures, unspecified: Secondary | ICD-10-CM | POA: Diagnosis not present

## 2022-02-14 DIAGNOSIS — D6869 Other thrombophilia: Secondary | ICD-10-CM | POA: Diagnosis not present

## 2022-02-14 DIAGNOSIS — I700 Atherosclerosis of aorta: Secondary | ICD-10-CM | POA: Diagnosis not present

## 2022-02-21 DIAGNOSIS — M25511 Pain in right shoulder: Secondary | ICD-10-CM | POA: Diagnosis not present

## 2022-02-21 DIAGNOSIS — M25512 Pain in left shoulder: Secondary | ICD-10-CM | POA: Diagnosis not present

## 2022-02-26 ENCOUNTER — Encounter: Payer: Self-pay | Admitting: Hematology and Oncology

## 2022-02-26 DIAGNOSIS — M25511 Pain in right shoulder: Secondary | ICD-10-CM | POA: Diagnosis not present

## 2022-02-26 DIAGNOSIS — M25512 Pain in left shoulder: Secondary | ICD-10-CM | POA: Diagnosis not present

## 2022-02-27 ENCOUNTER — Ambulatory Visit: Payer: Medicare HMO | Admitting: Internal Medicine

## 2022-02-28 DIAGNOSIS — M25512 Pain in left shoulder: Secondary | ICD-10-CM | POA: Diagnosis not present

## 2022-02-28 DIAGNOSIS — M25511 Pain in right shoulder: Secondary | ICD-10-CM | POA: Diagnosis not present

## 2022-03-04 ENCOUNTER — Encounter: Payer: Self-pay | Admitting: Internal Medicine

## 2022-03-04 ENCOUNTER — Ambulatory Visit: Payer: Medicare HMO | Attending: Internal Medicine

## 2022-03-04 ENCOUNTER — Ambulatory Visit: Payer: Medicare HMO | Attending: Internal Medicine | Admitting: Internal Medicine

## 2022-03-04 VITALS — BP 138/78 | HR 98 | Ht 60.0 in | Wt 146.0 lb

## 2022-03-04 DIAGNOSIS — R002 Palpitations: Secondary | ICD-10-CM

## 2022-03-04 DIAGNOSIS — Z79899 Other long term (current) drug therapy: Secondary | ICD-10-CM | POA: Diagnosis not present

## 2022-03-04 DIAGNOSIS — Z5181 Encounter for therapeutic drug level monitoring: Secondary | ICD-10-CM

## 2022-03-04 NOTE — Progress Notes (Unsigned)
Enrolled for [employer] to mail a ZIO XT long term holter monitor to the patients address on file.  

## 2022-03-04 NOTE — Progress Notes (Signed)
Cardiology Office Note:    Date:  03/04/2022   ID:  Lisa Chapman, DOB [birth year], MRN [id number]  PCP:  [name], MD   [place] Providers Cardiologist:  Janina Mayo, MD     Referring MD: Wilber Bihari Cornett*   No chief complaint on file. Cardio Oncology Patient; palpitations  History of Present Illness:    Lisa Chapman is a 77 y.o. female with a hx of  stage IIA ER+ , HER2+ of the R breast, undergoing her2 therapy followed by Dr. Lindi Adie and Wilber Bihari. She noted palpitations. She says during the summer her heart races. It can go up to 200 bpm. She checks with her husbands BP monitor. Her Bps are normal typically at home. No DM. No syncope. She noted it 2 weeks ago. She walks for exercise. She denies DOE or LH. No chest pain or pressure. TSH is normal.  CVD risk factors:  -Age - prior smoker, stopped in 1995. Smoked for 30 years  Prior Cardiac Dx: none   Cardiology Studies: 11/15/2020- nl LV function, GLS -17 % borderline, nl RV function, no significant valve dx. 02/12/2021- nl LV fxn, borderline strain GLS -17% 05/15/2021-nl LV fxn, GLS -20 % 08/10/2021-nl LV fxn, GLS -19 %  10/27/2020- NSR, diffuse TWI; more prominent anterolaterally  Past Medical History:  Diagnosis Date   Breast cancer ([place])    Cancer ([place]) 2022   Breast Cancer   Diverticulosis    Dysrhythmia    Heart murmur    History of colonic polyps    History of DVT (deep vein thrombosis)    Right leg, April 2015   History of shingles    Mixed hyperlipidemia    Personal history of chemotherapy    Port-A-Cath in place 11/17/2020    Past Surgical History:  Procedure Laterality Date   BREAST LUMPECTOMY Right 04/09/2021   BREAST LUMPECTOMY WITH RADIOACTIVE SEED LOCALIZATION Right 04/09/2021   Procedure: RIGHT BREAST LUMPECTOMY WITH RADIOACTIVE SEED LOCALIZATION;  Surgeon: Coralie Keens, MD;  Location: [facility];  Service: General;  Laterality: Right;   HERNIA  REPAIR     MENISCUS REPAIR Left 03/2019   PORTACATH PLACEMENT Left 11/03/2020   Procedure: INSERTION PORT-A-CATH;  Surgeon: Coralie Keens, MD;  Location: [facility];  Service: General;  Laterality: Left;   RADIOACTIVE SEED GUIDED AXILLARY SENTINEL LYMPH NODE Right 04/09/2021   Procedure: RADIOACTIVE SEED GUIDED RIGHT AXILLARY SENTINEL LYMPH NODE DISSECTION;  Surgeon: Coralie Keens, MD;  Location: [facility];  Service: General;  Laterality: Right;   TONSILLECTOMY      Current Medications: Current Meds  Medication Sig   anastrozole (ARIMIDEX) 1 MG tablet Take 1 mg by mouth daily.   rosuvastatin (CRESTOR) 20 MG tablet Take 20 mg by mouth daily.   XARELTO 20 MG TABS tablet TAKE 1 TABLET BY MOUTH DAILY WITH SUPPER.     Allergies:   Other and Boniva [ibandronic acid]   Social History   Socioeconomic History   Marital status: Married    Spouse name: Not on file   Number of children: Not on file   Years of education: Not on file   Highest education level: Not on file  Occupational History   Not on file  Tobacco Use   Smoking status: Never   Smokeless tobacco: Never  Vaping Use   Vaping Use: Never used  Substance and Sexual Activity   Alcohol use: No    Alcohol/week: 0.0 standard drinks of  alcohol   Drug use: No   Sexual activity: Not on file  Other Topics Concern   Not on file  Social History Narrative   Married. No children   Social Determinants of Radio broadcast assistant Strain: Not on file  Food Insecurity: Not on file  Transportation Needs: Not on file  Physical Activity: Not on file  Stress: Not on file  Social Connections: Not on file     Family History: The patient's family history includes COPD in her father; Emphysema in her father; Heart attack in her father and mother. Deceased. Parents were >59 years old. No cardiac disease in her sibling.  ROS:   Please see the history of present illness.     All other systems reviewed and are  negative.  EKGs/Labs/Other Studies Reviewed:    The following studies were reviewed today:   EKG:  EKG is  ordered today.  The ekg ordered today demonstrates   03/04/2022- NSR with sinus arrhytymia  Recent Labs: 11/08/2021: ALT 8; BUN 14; Creatinine 0.68; Hemoglobin 11.9; Platelet Count 238; Potassium 4.1; Sodium 141   Recent Lipid Panel No results found for: "CHOL", "TRIG", "HDL", "CHOLHDL", "VLDL", "LDLCALC", "LDLDIRECT"   Risk Assessment/Calculations:     Physical Exam:    VS:   Vitals:   03/04/22 1344  BP: 138/78  Pulse: 98  SpO2: 98%    Wt Readings from Last 3 Encounters:  03/04/22 146 lb (66.2 kg)  02/08/22 146 lb 11.2 oz (66.5 kg)  11/08/21 144 lb 1.6 oz (65.4 kg)     GEN:  Well nourished, well developed in no acute distress HEENT: Normal NECK: No JVD; No carotid bruits LYMPHATICS: No lymphadenopathy CARDIAC: RRR, no murmurs, rubs, gallops RESPIRATORY:  Clear to auscultation without rales, wheezing or rhonchi  ABDOMEN: Soft, non-tender, non-distended MUSCULOSKELETAL:  No edema; No deformity  SKIN: Warm and dry NEUROLOGIC:  Alert and oriented x 3 PSYCHIATRIC:  Normal affect   ASSESSMENT:    Her2+ BC: right sided, no plans for left sided XRT. She was treated with trastuzumab + pertuzumab 11/17/2020-11/08/2021 s/p 1 year. No XRT (declined). S/p letrozole 06/14/2021.  GLS has been stable, borderline low. Bp are in good control at home. No signs of cardiac dysfunction. She is post HER2, will get a FU limited echo with strain.  Palpitations: 14 day ziopatch  DVT: on xarelto 20 mg daily  HLD: on crestor 20 mg daily  PLAN:    In order of problems listed above:  Limited TTE GLS post HER2 therapy 14 day ziopatch Follow up 3 months         Medication Adjustments/Labs and Tests Ordered: Current medicines are reviewed at length with the patient today.  Concerns regarding medicines are outlined above.  Orders Placed This Encounter  Procedures   LONG TERM  MONITOR (3-14 DAYS)   EKG 12-Lead   ECHOCARDIOGRAM LIMITED   No orders of the defined types were placed in this encounter.   Patient Instructions  Medication Instructions:  Your physician recommends that you continue on your current medications as directed. Please refer to the Current Medication list given to you today.  *If you need a refill on your cardiac medications before your next appointment, please call your pharmacy*   Lab Work: NONE If you have labs (blood work) drawn today and your tests are completely normal, you will receive your results only by: [place] (if you have MyChart) OR A paper copy in the mail If you have any  lab test that is abnormal or we need to change your treatment, we will call you to review the results.   Testing/Procedures: Your physician recommends that you have a limited echocardiogram performed.   ZIO XT- Long Term Monitor Instructions  Your physician has requested you wear a ZIO patch monitor for 14 days.  This is a single patch monitor. [employer] supplies one patch monitor per enrollment. Additional stickers are not available. Please do not apply patch if you will be having a Nuclear Stress Test,  Echocardiogram, Cardiac CT, MRI, or Chest Xray during the period you would be wearing the  monitor. The patch cannot be worn during these tests. You cannot remove and re-apply the  ZIO XT patch monitor.  Your ZIO patch monitor will be mailed 3 day USPS to your address on file. It may take 3-5 days  to receive your monitor after you have been enrolled.  Once you have received your monitor, please review the enclosed instructions. Your monitor  has already been registered assigning a specific monitor serial # to you.  Billing and Patient Assistance Program Information  We have supplied [employer] with any of your insurance information on file for billing purposes. [employer] offers a sliding scale Patient Assistance Program for patients that do not  have  insurance, or whose insurance does not completely cover the cost of the ZIO monitor.  You must apply for the Patient Assistance Program to qualify for this discounted rate.  To apply, please call [employer] at [phone], select option 4, select option 2, ask to apply for  Patient Assistance Program. Theodore Demark will ask your household income, and how many people  are in your household. They will quote your out-of-pocket cost based on that information.  [employer] will also be able to set up a 61-month interest-free payment plan if needed.  Applying the monitor Shave hair from upper left chest.  Hold abrader disc by orange tab. Rub abrader in 40 strokes over the upper left chest as  indicated in your monitor instructions.  Clean area with 4 enclosed alcohol pads. Let dry.  Apply patch as indicated in monitor instructions. Patch will be placed under collarbone on left  side of chest with arrow pointing upward.  Rub patch adhesive wings for 2 minutes. Remove white label marked "1". Remove the white  label marked "2". Rub patch adhesive wings for 2 additional minutes.  While looking in a mirror, press and release button in center of patch. A small green light will  flash 3-4 times. This will be your only indicator that the monitor has been turned on.  Do not shower for the first 24 hours. You may shower after the first 24 hours.  Press the button if you feel a symptom. You will hear a small click. Record Date, Time and  Symptom in the Patient Logbook.  When you are ready to remove the patch, follow instructions on the last 2 pages of Patient  Logbook. Stick patch monitor onto the last page of Patient Logbook.  Place Patient Logbook in the blue and white box. Use locking tab on box and tape box closed  securely. The blue and white box has prepaid postage on it. Please place it in the mailbox as  soon as possible. Your physician should have your test results approximately 7 days after the   monitor has been [facility]  Call IEdenat [phone] you have questions regarding  your ZIO XT patch monitor.  Call them immediately if you see an orange light blinking on your  monitor.  If your monitor falls off in less than 4 days, contact our Monitor department at [phone].  If your monitor becomes loose or falls off after 4 days call [employer] at [phone] for  suggestions on securing your monitor    Follow-Up: At [facility], you and your health needs are our priority.  As part of our continuing mission to provide you with exceptional heart care, we have created designated Provider Care Teams.  These Care Teams include your primary Cardiologist (physician) and Advanced Practice Providers (APPs -  Physician Assistants and Nurse Practitioners) who all work together to provide you with the care you need, when you need it.  We recommend signing up for the patient portal called "MyChart".  Sign up information is provided on this After Visit Summary.  MyChart is used to connect with patients for Virtual Visits (Telemedicine).  Patients are able to view lab/test results, encounter notes, upcoming appointments, etc.  Non-urgent messages can be sent to your provider as well.   To learn more about what you can do with MyChart, go to [email or web address].    Your next appointment:   3 month(s)  Provider:   Janina Mayo, MD      Signed, Janina Mayo, MD  03/04/2022 2:17 PM    [place]

## 2022-03-04 NOTE — Patient Instructions (Signed)
Medication Instructions:  Your physician recommends that you continue on your current medications as directed. Please refer to the Current Medication list given to you today.  *If you need a refill on your cardiac medications before your next appointment, please call your pharmacy*   Lab Work: NONE If you have labs (blood work) drawn today and your tests are completely normal, you will receive your results only by: [place] (if you have MyChart) OR A paper copy in the mail If you have any lab test that is abnormal or we need to change your treatment, we will call you to review the results.   Testing/Procedures: Your physician recommends that you have a limited echocardiogram performed.   ZIO XT- Long Term Monitor Instructions  Your physician has requested you wear a ZIO patch monitor for 14 days.  This is a single patch monitor. [employer] supplies one patch monitor per enrollment. Additional stickers are not available. Please do not apply patch if you will be having a Nuclear Stress Test,  Echocardiogram, Cardiac CT, MRI, or Chest Xray during the period you would be wearing the  monitor. The patch cannot be worn during these tests. You cannot remove and re-apply the  ZIO XT patch monitor.  Your ZIO patch monitor will be mailed 3 day USPS to your address on file. It may take 3-5 days  to receive your monitor after you have been enrolled.  Once you have received your monitor, please review the enclosed instructions. Your monitor  has already been registered assigning a specific monitor serial # to you.  Billing and Patient Assistance Program Information  We have supplied [employer] with any of your insurance information on file for billing purposes. [employer] offers a sliding scale Patient Assistance Program for patients that do not have  insurance, or whose insurance does not completely cover the cost of the ZIO monitor.  You must apply for the Patient Assistance Program to qualify  for this discounted rate.  To apply, please call [employer] at [phone], select option 4, select option 2, ask to apply for  Patient Assistance Program. Theodore Demark will ask your household income, and how many people  are in your household. They will quote your out-of-pocket cost based on that information.  [employer] will also be able to set up a 38-month interest-free payment plan if needed.  Applying the monitor Shave hair from upper left chest.  Hold abrader disc by orange tab. Rub abrader in 40 strokes over the upper left chest as  indicated in your monitor instructions.  Clean area with 4 enclosed alcohol pads. Let dry.  Apply patch as indicated in monitor instructions. Patch will be placed under collarbone on left  side of chest with arrow pointing upward.  Rub patch adhesive wings for 2 minutes. Remove white label marked "1". Remove the white  label marked "2". Rub patch adhesive wings for 2 additional minutes.  While looking in a mirror, press and release button in center of patch. A small green light will  flash 3-4 times. This will be your only indicator that the monitor has been turned on.  Do not shower for the first 24 hours. You may shower after the first 24 hours.  Press the button if you feel a symptom. You will hear a small click. Record Date, Time and  Symptom in the Patient Logbook.  When you are ready to remove the patch, follow instructions on the last 2 pages of Patient  Logbook. Stick patch monitor onto the  last page of Patient Logbook.  Place Patient Logbook in the blue and white box. Use locking tab on box and tape box closed  securely. The blue and white box has prepaid postage on it. Please place it in the mailbox as  soon as possible. Your physician should have your test results approximately 7 days after the  monitor has been mailed back to [facility].  Call [place] at [phone] if you have questions regarding  your ZIO XT patch  monitor. Call them immediately if you see an orange light blinking on your  monitor.  If your monitor falls off in less than 4 days, contact our Monitor department at [phone].  If your monitor becomes loose or falls off after 4 days call [employer] at [phone] for  suggestions on securing your monitor    Follow-Up: At [facility], you and your health needs are our priority.  As part of our continuing mission to provide you with exceptional heart care, we have created designated Provider Care Teams.  These Care Teams include your primary Cardiologist (physician) and Advanced Practice Providers (APPs -  Physician Assistants and Nurse Practitioners) who all work together to provide you with the care you need, when you need it.  We recommend signing up for the patient portal called "MyChart".  Sign up information is provided on this After Visit Summary.  MyChart is used to connect with patients for Virtual Visits (Telemedicine).  Patients are able to view lab/test results, encounter notes, upcoming appointments, etc.  Non-urgent messages can be sent to your provider as well.   To learn more about what you can do with MyChart, go to [email or web address].    Your next appointment:   3 month(s)  Provider:   Janina Mayo, MD

## 2022-03-05 DIAGNOSIS — M25512 Pain in left shoulder: Secondary | ICD-10-CM | POA: Diagnosis not present

## 2022-03-05 DIAGNOSIS — M25511 Pain in right shoulder: Secondary | ICD-10-CM | POA: Diagnosis not present

## 2022-03-07 DIAGNOSIS — M25511 Pain in right shoulder: Secondary | ICD-10-CM | POA: Diagnosis not present

## 2022-03-07 DIAGNOSIS — M25512 Pain in left shoulder: Secondary | ICD-10-CM | POA: Diagnosis not present

## 2022-03-08 DIAGNOSIS — R002 Palpitations: Secondary | ICD-10-CM

## 2022-03-12 DIAGNOSIS — M25512 Pain in left shoulder: Secondary | ICD-10-CM | POA: Diagnosis not present

## 2022-03-12 DIAGNOSIS — M25511 Pain in right shoulder: Secondary | ICD-10-CM | POA: Diagnosis not present

## 2022-03-14 DIAGNOSIS — M25511 Pain in right shoulder: Secondary | ICD-10-CM | POA: Diagnosis not present

## 2022-03-14 DIAGNOSIS — M25512 Pain in left shoulder: Secondary | ICD-10-CM | POA: Diagnosis not present

## 2022-03-15 DIAGNOSIS — M25531 Pain in right wrist: Secondary | ICD-10-CM | POA: Diagnosis not present

## 2022-03-19 DIAGNOSIS — M25511 Pain in right shoulder: Secondary | ICD-10-CM | POA: Diagnosis not present

## 2022-03-19 DIAGNOSIS — M25512 Pain in left shoulder: Secondary | ICD-10-CM | POA: Diagnosis not present

## 2022-03-21 DIAGNOSIS — M25511 Pain in right shoulder: Secondary | ICD-10-CM | POA: Diagnosis not present

## 2022-03-21 DIAGNOSIS — M25512 Pain in left shoulder: Secondary | ICD-10-CM | POA: Diagnosis not present

## 2022-03-26 ENCOUNTER — Telehealth (HOSPITAL_COMMUNITY): Payer: Self-pay | Admitting: Internal Medicine

## 2022-03-26 NOTE — Telephone Encounter (Signed)
Patient called and cancelled echocardiogram scheduled for 03/28/22.  She did not wish to reschedule. Order will be removed from the active echo WQ. Thank you

## 2022-03-27 DIAGNOSIS — R002 Palpitations: Secondary | ICD-10-CM | POA: Diagnosis not present

## 2022-03-28 ENCOUNTER — Telehealth: Payer: Self-pay | Admitting: Internal Medicine

## 2022-03-28 ENCOUNTER — Other Ambulatory Visit: Payer: Self-pay | Admitting: Internal Medicine

## 2022-03-28 DIAGNOSIS — I480 Paroxysmal atrial fibrillation: Secondary | ICD-10-CM | POA: Insufficient documentation

## 2022-03-28 MED ORDER — METOPROLOL SUCCINATE ER 25 MG PO TB24: 25.0000 mg | ORAL_TABLET | Freq: Every day | ORAL | 2 refills | Status: DC

## 2022-03-28 MED ORDER — DILTIAZEM HCL 30 MG PO TABS: 30.0000 mg | ORAL_TABLET | Freq: Four times a day (QID) | ORAL | 0 refills | Status: DC

## 2022-03-28 NOTE — Telephone Encounter (Signed)
Called Lisa Chapman and let her know that she has atrial fibrillation. We discussed that she is to continue her xarelto. I started a beta blocker and as needed diltiazem for breakthrough afib. She has a low burden. Will see her in follow-up and cc-ing her oncology team.

## 2022-03-29 ENCOUNTER — Other Ambulatory Visit (HOSPITAL_COMMUNITY): Payer: Medicare HMO

## 2022-04-02 DIAGNOSIS — M25531 Pain in right wrist: Secondary | ICD-10-CM | POA: Diagnosis not present

## 2022-04-02 DIAGNOSIS — M79642 Pain in left hand: Secondary | ICD-10-CM | POA: Diagnosis not present

## 2022-04-10 DIAGNOSIS — I700 Atherosclerosis of aorta: Secondary | ICD-10-CM | POA: Diagnosis not present

## 2022-04-10 DIAGNOSIS — Z299 Encounter for prophylactic measures, unspecified: Secondary | ICD-10-CM | POA: Diagnosis not present

## 2022-04-10 DIAGNOSIS — L821 Other seborrheic keratosis: Secondary | ICD-10-CM | POA: Diagnosis not present

## 2022-04-10 DIAGNOSIS — I10 Essential (primary) hypertension: Secondary | ICD-10-CM | POA: Diagnosis not present

## 2022-04-10 DIAGNOSIS — I4891 Unspecified atrial fibrillation: Secondary | ICD-10-CM | POA: Diagnosis not present

## 2022-04-22 ENCOUNTER — Other Ambulatory Visit: Payer: Self-pay | Admitting: Internal Medicine

## 2022-04-22 DIAGNOSIS — I480 Paroxysmal atrial fibrillation: Secondary | ICD-10-CM

## 2022-05-01 DIAGNOSIS — G5603 Carpal tunnel syndrome, bilateral upper limbs: Secondary | ICD-10-CM | POA: Diagnosis not present

## 2022-05-02 DIAGNOSIS — M79642 Pain in left hand: Secondary | ICD-10-CM | POA: Diagnosis not present

## 2022-05-06 ENCOUNTER — Other Ambulatory Visit: Payer: Self-pay | Admitting: Hematology and Oncology

## 2022-05-14 ENCOUNTER — Inpatient Hospital Stay: Payer: Medicare HMO | Attending: Hematology and Oncology | Admitting: Hematology and Oncology

## 2022-05-14 NOTE — Assessment & Plan Note (Deleted)
Palpable lump in the right breast, mammogram detected 3.6 cm mass with several axillary lymph nodes, biopsy revealed grade 1-2 IDC, lymph nodes positive for cancer, ER 40%, PR 0%, Ki-67 15%, HER2 positive 3+ by IHC   Treatment plan: 1. Neoadjuvant chemotherapy with TCH Perjeta 6 cycles followed by Herceptin Perjeta maintenance for 1 year completed 11/08/2021 2. Followed by lumpectomy and axillary node dissection completed on April 09, 2021 consistent with complete response 3.  Adjuvant radiation in [place] 4.  Followed by antiestrogen therapy with letrozole 2.5 mg daily started 06/14/2021 __________________________________________________________________________________ Current treatment: Anastrozole started 06/14/2021 Anastrozole toxicities:  Tolerating it well. (Itching to Letrozole)   Breast cancer surveillance: Breast exam 05/14/2022: Benign Mammogram: Will need to be performed  Return to clinic in 1 year for follow-up Return to clinic in 6 months for follow-up

## 2022-05-20 DIAGNOSIS — G5601 Carpal tunnel syndrome, right upper limb: Secondary | ICD-10-CM | POA: Diagnosis not present

## 2022-05-30 DIAGNOSIS — G5603 Carpal tunnel syndrome, bilateral upper limbs: Secondary | ICD-10-CM | POA: Diagnosis not present

## 2022-06-04 ENCOUNTER — Ambulatory Visit: Payer: Medicare HMO | Admitting: Internal Medicine

## 2022-06-06 DIAGNOSIS — G5603 Carpal tunnel syndrome, bilateral upper limbs: Secondary | ICD-10-CM | POA: Diagnosis not present

## 2022-06-10 ENCOUNTER — Ambulatory Visit: Payer: Medicare HMO | Admitting: Internal Medicine

## 2022-06-22 ENCOUNTER — Other Ambulatory Visit: Payer: Self-pay | Admitting: Internal Medicine

## 2022-06-22 ENCOUNTER — Other Ambulatory Visit: Payer: Self-pay | Admitting: Hematology and Oncology

## 2022-06-22 DIAGNOSIS — I480 Paroxysmal atrial fibrillation: Secondary | ICD-10-CM

## 2022-07-03 DIAGNOSIS — M25531 Pain in right wrist: Secondary | ICD-10-CM | POA: Diagnosis not present

## 2022-07-03 DIAGNOSIS — M79642 Pain in left hand: Secondary | ICD-10-CM | POA: Diagnosis not present

## 2022-07-03 DIAGNOSIS — G5603 Carpal tunnel syndrome, bilateral upper limbs: Secondary | ICD-10-CM | POA: Diagnosis not present

## 2022-07-05 DIAGNOSIS — I700 Atherosclerosis of aorta: Secondary | ICD-10-CM | POA: Diagnosis not present

## 2022-07-05 DIAGNOSIS — D6869 Other thrombophilia: Secondary | ICD-10-CM | POA: Diagnosis not present

## 2022-07-05 DIAGNOSIS — I10 Essential (primary) hypertension: Secondary | ICD-10-CM | POA: Diagnosis not present

## 2022-07-05 DIAGNOSIS — Z0000 Encounter for general adult medical examination without abnormal findings: Secondary | ICD-10-CM | POA: Diagnosis not present

## 2022-07-05 DIAGNOSIS — Z1331 Encounter for screening for depression: Secondary | ICD-10-CM | POA: Diagnosis not present

## 2022-07-05 DIAGNOSIS — Z7189 Other specified counseling: Secondary | ICD-10-CM | POA: Diagnosis not present

## 2022-07-05 DIAGNOSIS — Z299 Encounter for prophylactic measures, unspecified: Secondary | ICD-10-CM | POA: Diagnosis not present

## 2022-07-05 DIAGNOSIS — Z1339 Encounter for screening examination for other mental health and behavioral disorders: Secondary | ICD-10-CM | POA: Diagnosis not present

## 2022-08-16 ENCOUNTER — Other Ambulatory Visit: Payer: Self-pay | Admitting: Hematology and Oncology

## 2022-09-09 DIAGNOSIS — I10 Essential (primary) hypertension: Secondary | ICD-10-CM | POA: Diagnosis not present

## 2022-09-09 DIAGNOSIS — R1032 Left lower quadrant pain: Secondary | ICD-10-CM | POA: Diagnosis not present

## 2022-09-09 DIAGNOSIS — Z299 Encounter for prophylactic measures, unspecified: Secondary | ICD-10-CM | POA: Diagnosis not present

## 2022-09-10 DIAGNOSIS — R1032 Left lower quadrant pain: Secondary | ICD-10-CM | POA: Diagnosis not present

## 2022-09-10 DIAGNOSIS — K529 Noninfective gastroenteritis and colitis, unspecified: Secondary | ICD-10-CM | POA: Diagnosis not present

## 2022-09-10 DIAGNOSIS — K5730 Diverticulosis of large intestine without perforation or abscess without bleeding: Secondary | ICD-10-CM | POA: Diagnosis not present

## 2022-09-10 DIAGNOSIS — I700 Atherosclerosis of aorta: Secondary | ICD-10-CM | POA: Diagnosis not present

## 2022-09-24 ENCOUNTER — Other Ambulatory Visit: Payer: Self-pay | Admitting: Internal Medicine

## 2022-09-24 DIAGNOSIS — I480 Paroxysmal atrial fibrillation: Secondary | ICD-10-CM

## 2022-10-24 ENCOUNTER — Ambulatory Visit: Payer: Medicare HMO | Admitting: Hematology and Oncology

## 2022-10-29 ENCOUNTER — Telehealth: Payer: Self-pay | Admitting: Adult Health

## 2022-10-29 NOTE — Telephone Encounter (Signed)
Patient is aware of rescheduled appointment times/dates

## 2022-12-03 DIAGNOSIS — E7800 Pure hypercholesterolemia, unspecified: Secondary | ICD-10-CM | POA: Diagnosis not present

## 2022-12-03 DIAGNOSIS — Z79899 Other long term (current) drug therapy: Secondary | ICD-10-CM | POA: Diagnosis not present

## 2022-12-03 DIAGNOSIS — R5383 Other fatigue: Secondary | ICD-10-CM | POA: Diagnosis not present

## 2022-12-09 ENCOUNTER — Ambulatory Visit: Payer: Medicare HMO | Admitting: Adult Health

## 2022-12-16 ENCOUNTER — Encounter: Payer: Self-pay | Admitting: Adult Health

## 2022-12-16 ENCOUNTER — Inpatient Hospital Stay: Payer: Medicare HMO | Attending: Adult Health | Admitting: Adult Health

## 2022-12-16 VITALS — BP 145/70 | HR 59 | Temp 98.0°F | Resp 18 | Ht 60.0 in | Wt 154.2 lb

## 2022-12-16 DIAGNOSIS — Z853 Personal history of malignant neoplasm of breast: Secondary | ICD-10-CM | POA: Insufficient documentation

## 2022-12-16 DIAGNOSIS — E782 Mixed hyperlipidemia: Secondary | ICD-10-CM | POA: Insufficient documentation

## 2022-12-16 DIAGNOSIS — Z9221 Personal history of antineoplastic chemotherapy: Secondary | ICD-10-CM | POA: Diagnosis not present

## 2022-12-16 DIAGNOSIS — I480 Paroxysmal atrial fibrillation: Secondary | ICD-10-CM | POA: Insufficient documentation

## 2022-12-16 DIAGNOSIS — Z79899 Other long term (current) drug therapy: Secondary | ICD-10-CM | POA: Insufficient documentation

## 2022-12-16 DIAGNOSIS — C50411 Malignant neoplasm of upper-outer quadrant of right female breast: Secondary | ICD-10-CM

## 2022-12-16 DIAGNOSIS — I479 Paroxysmal tachycardia, unspecified: Secondary | ICD-10-CM | POA: Diagnosis not present

## 2022-12-16 DIAGNOSIS — Z86718 Personal history of other venous thrombosis and embolism: Secondary | ICD-10-CM | POA: Diagnosis not present

## 2022-12-16 DIAGNOSIS — Z923 Personal history of irradiation: Secondary | ICD-10-CM | POA: Insufficient documentation

## 2022-12-16 DIAGNOSIS — Z170 Estrogen receptor positive status [ER+]: Secondary | ICD-10-CM | POA: Diagnosis not present

## 2022-12-16 NOTE — Progress Notes (Signed)
[facility] Cancer Follow up:    Lisa Specking, MD [address] [place] [address]   DIAGNOSIS:  Cancer Staging  Malignant neoplasm of upper-outer quadrant of right breast in female, estrogen receptor positive (HCC) Staging form: Breast, AJCC 8th Edition - Clinical stage from 10/31/2020: Stage IIA (cT2, cN1, cM0, G2, ER+, PR-, HER2+) - Signed by Serena Croissant, MD on 10/31/2020 Stage prefix: Initial diagnosis Histologic grading system: 3 grade system - Pathologic stage from 04/09/2021: No Stage Recommended (ypT0, pN0, cM0) - Signed by Loa Socks, NP on 07/26/2021 Stage prefix: Post-therapy   SUMMARY OF ONCOLOGIC HISTORY: Oncology History  Malignant neoplasm of upper-outer quadrant of right breast in female, estrogen receptor positive (HCC)  10/04/2020 Initial Diagnosis   Palpable lump in the right breast, mammogram detected 3.6 cm mass with several axillary lymph nodes, biopsy revealed grade 1-2 IDC, lymph nodes positive for cancer, ER 40%, PR 0%, Ki-67 15%, HER2 positive 3+ by [facility]   10/31/2020 Cancer Staging   Staging form: Breast, AJCC 8th Edition - Clinical stage from 10/31/2020: Stage IIA (cT2, cN1, cM0, G2, ER+, PR-, HER2+) - Signed by Serena Croissant, MD on 10/31/2020 Stage prefix: Initial diagnosis Histologic grading system: 3 grade system   11/17/2020 - 03/01/2021 Chemotherapy   Patient is on Treatment Plan : BREAST  Docetaxel + Carboplatin + Trastuzumab + Pertuzumab  (TCHP) q21d  x 6     03/23/2021 -  Chemotherapy   Herceptin/Perjeta to complete one year of immunotherapy    04/09/2021 Surgery   Right lumpectomy and axillary node dissection: no residual cancer, 18 lymph nodes negative for cancer. ypT0,N0   04/09/2021 Cancer Staging   Staging form: Breast, AJCC 8th Edition - Pathologic stage from 04/09/2021: No Stage Recommended (ypT0, pN0, cM0) - Signed by Loa Socks, NP on 07/26/2021 Stage prefix: Post-therapy   05/2021 -  Radiation Therapy    Declined adjuvant radiation therapy    06/14/2021 -  Anti-estrogen oral therapy   Letrozole daily     CURRENT THERAPY: Letrozole  INTERVAL HISTORY:   Discussed the use of AI scribe software for clinical note transcription with the patient, who gave verbal consent to proceed.  Lisa Chapman 77 y.o. female returns for follow-up of her breast cancer.  She is not taking antiestrogen therapy. The patient also notes a new spot on the breast, which is tender to touch. She has a scheduled mammogram later this month to monitor for any recurrence of cancer.  She denies any new complaints or changes in health status. She reports intermittent issues with atrial fibrillation and tachycardia.  Regarding medications, the patient confirms she is no longer taking anastrozole, diltiazem, or metoprolol XL. She continues to take Crestor.  The patient maintains an active lifestyle, walking regularly with her pet dog. She reports adequate sleep and a preference for vegetables over fruits in her diet.  Patient Active Problem List   Diagnosis Date Noted   Paroxysmal atrial fibrillation (HCC) 03/28/2022   Malignant neoplasm of upper-outer quadrant of right breast in female, estrogen receptor positive (HCC) 10/31/2020   Recurrent umbilical hernia 11/25/2019   DVT (deep venous thrombosis) (HCC) 03/22/2019   Paroxysmal supraventricular tachycardia (HCC) 03/08/2014   Cardiac murmur 03/08/2014   Mixed hyperlipidemia 03/08/2014    is allergic to other and boniva [ibandronic acid].  MEDICAL HISTORY: Past Medical History:  Diagnosis Date   Breast cancer (HCC)    Cancer (HCC) 2022   Breast Cancer   Diverticulosis  Dysrhythmia    Heart murmur    History of colonic polyps    History of DVT (deep vein thrombosis)    Right leg, April 2015   History of shingles    Mixed hyperlipidemia    Personal history of chemotherapy    Port-A-Cath in place 11/17/2020    SURGICAL HISTORY: Past Surgical  History:  Procedure Laterality Date   BREAST LUMPECTOMY Right 04/09/2021   BREAST LUMPECTOMY WITH RADIOACTIVE SEED LOCALIZATION Right 04/09/2021   Procedure: RIGHT BREAST LUMPECTOMY WITH RADIOACTIVE SEED LOCALIZATION;  Surgeon: Abigail Miyamoto, MD;  Location: [facility];  Service: General;  Laterality: Right;   HERNIA REPAIR     MENISCUS REPAIR Left 03/2019   PORTACATH PLACEMENT Left 11/03/2020   Procedure: INSERTION PORT-A-CATH;  Surgeon: Abigail Miyamoto, MD;  Location: [facility];  Service: General;  Laterality: Left;   RADIOACTIVE SEED GUIDED AXILLARY SENTINEL LYMPH NODE Right 04/09/2021   Procedure: RADIOACTIVE SEED GUIDED RIGHT AXILLARY SENTINEL LYMPH NODE DISSECTION;  Surgeon: Abigail Miyamoto, MD;  Location: [facility];  Service: General;  Laterality: Right;   TONSILLECTOMY      SOCIAL HISTORY: Social History   Socioeconomic History   Marital status: Married    Spouse name: Not on file   Number of children: Not on file   Years of education: Not on file   Highest education level: Not on file  Occupational History   Not on file  Tobacco Use   Smoking status: Never   Smokeless tobacco: Never  Vaping Use   Vaping status: Never Used  Substance and Sexual Activity   Alcohol use: No    Alcohol/week: 0.0 standard drinks of alcohol   Drug use: No   Sexual activity: Not on file  Other Topics Concern   Not on file  Social History Narrative   Married. No children   Social Determinants of Corporate investment banker Strain: Not on file  Food Insecurity: Not on file  Transportation Needs: Not on file  Physical Activity: Not on file  Stress: Not on file  Social Connections: Not on file  Intimate Partner Violence: Not on file    FAMILY HISTORY: Family History  Problem Relation Age of Onset   Heart attack Mother    COPD Father    Heart attack Father    Emphysema Father     Review of Systems  Constitutional:  Negative for appetite  change, chills, fatigue, fever and unexpected weight change.  HENT:   Negative for hearing loss, lump/mass and trouble swallowing.   Eyes:  Negative for eye problems and icterus.  Respiratory:  Negative for chest tightness, cough and shortness of breath.   Cardiovascular:  Negative for chest pain, leg swelling and palpitations.  Gastrointestinal:  Negative for abdominal distention, abdominal pain, constipation, diarrhea, nausea and vomiting.  Endocrine: Negative for hot flashes.  Genitourinary:  Negative for difficulty urinating.   Musculoskeletal:  Negative for arthralgias.  Skin:  Negative for itching and rash.  Neurological:  Negative for dizziness, extremity weakness, headaches and numbness.  Hematological:  Negative for adenopathy. Does not bruise/bleed easily.  Psychiatric/Behavioral:  Negative for depression. The patient is not nervous/anxious.       PHYSICAL EXAMINATION    Vitals:   12/16/22 1129  BP: (!) 145/70  Pulse: (!) 59  Resp: 18  Temp: 98 F (36.7 C)  SpO2: 93%    Physical Exam Constitutional:      General: She is not in acute distress.  Appearance: Normal appearance. She is not toxic-appearing.  HENT:     Head: Normocephalic and atraumatic.     Mouth/Throat:     Mouth: Mucous membranes are moist.     Pharynx: Oropharynx is clear. No oropharyngeal exudate or posterior oropharyngeal erythema.  Eyes:     General: No scleral icterus. Cardiovascular:     Rate and Rhythm: Normal rate and regular rhythm.     Pulses: Normal pulses.     Heart sounds: Normal heart sounds.  Pulmonary:     Effort: Pulmonary effort is normal.     Breath sounds: Normal breath sounds.  Chest:     Comments: Right breast status postlumpectomy and radiation, there is an area of focal tenderness in the right upper outer breast approximately 5 cm from the nipple.  No nodule is palpated.  Left breast is benign. Abdominal:     General: Abdomen is flat. Bowel sounds are normal. There is  no distension.     Palpations: Abdomen is soft.     Tenderness: There is no abdominal tenderness.  Musculoskeletal:        General: No swelling.     Cervical back: Neck supple.  Lymphadenopathy:     Cervical: No cervical adenopathy.     Upper Body:     Right upper body: No axillary adenopathy.     Left upper body: No axillary adenopathy.  Skin:    General: Skin is warm and dry.     Findings: No rash.  Neurological:     General: No focal deficit present.     Mental Status: She is alert.  Psychiatric:        Mood and Affect: Mood normal.        Behavior: Behavior normal.     LABORATORY DATA:  CBC    Component Value Date/Time   WBC 6.6 11/08/2021 0832   WBC 6.6 10/27/2020 1023   RBC 4.36 11/08/2021 0832   HGB 11.9 (L) 11/08/2021 0832   HCT 36.5 11/08/2021 0832   PLT 238 11/08/2021 0832   MCV 83.7 11/08/2021 0832   MCH 27.3 11/08/2021 0832   MCHC 32.6 11/08/2021 0832   RDW 15.4 11/08/2021 0832   LYMPHSABS 2.1 11/08/2021 0832   MONOABS 0.6 11/08/2021 0832   EOSABS 0.1 11/08/2021 0832   BASOSABS 0.0 11/08/2021 0832    CMP     Component Value Date/Time   NA 141 11/08/2021 0832   K 4.1 11/08/2021 0832   CL 104 11/08/2021 0832   CO2 29 11/08/2021 0832   GLUCOSE 101 (H) 11/08/2021 0832   BUN 14 11/08/2021 0832   CREATININE 0.68 11/08/2021 0832   CALCIUM 9.2 11/08/2021 0832   PROT 7.9 11/08/2021 0832   ALBUMIN 4.2 11/08/2021 0832   AST 16 11/08/2021 0832   ALT 8 11/08/2021 0832   ALKPHOS 67 11/08/2021 0832   BILITOT 0.3 11/08/2021 0832   GFRNONAA >60 11/08/2021 0832     ASSESSMENT and THERAPY PLAN:   Malignant neoplasm of upper-outer quadrant of right breast in female, estrogen receptor positive (HCC) Palpable lump in the right breast, mammogram detected 3.6 cm mass with several axillary lymph nodes, biopsy revealed grade 1-2 IDC, lymph nodes positive for cancer, ER 40%, PR 0%, Ki-67 15%, HER2 positive 3+ by IHC   Treatment plan: 1. Neoadjuvant chemotherapy  with TCH Perjeta 6 cycles followed by Herceptin Perjeta maintenance for 1 year 2. Followed by lumpectomy and axillary node dissection completed on April 09, 2021 consistent with complete  response 3.  Adjuvant radiation in [place] 4.  Followed by antiestrogen therapy with letrozole 2.5 mg daily started 06/14/2021; unable to tolerate, opted to forego all antiestrogen therapy and verbalized understanding of risks/benefits of AET.   Breast Cancer History of breast cancer with ongoing numbness in the arm post-surgery. Noted a new spot on the breast with tenderness. Mammogram scheduled for 12/25/2022 at [facility]. -Ensure mammogram is scheduled as a diagnostic mammogram. -Add-on ultrasound order and send to [facility]  Atrial Fibrillation and Tachycardia History of atrial fibrillation and tachycardia. No current symptoms reported. -Continue current management.  Hyperlipidemia On Crestor. -Continue Crestor.  General Health Maintenance Regular exercise with walking and playing with her dog. -Encourage continuation of regular exercise.  Follow-up in 1 year unless any issues arise.  All questions were answered. The patient knows to call the clinic with any problems, questions or concerns. We can certainly see the patient much sooner if necessary.  Total encounter time:30 minutes*in face-to-face visit time, chart review, lab review, care coordination, order entry, and documentation of the encounter time.    Lillard Anes, NP 12/16/22 12:11 PM Medical Oncology and Hematology [facility] [address] [place], [place] [address] Tel. [phone]    Fax. [phone]  *Total Encounter Time as defined by the Centers for Medicare and Medicaid Services includes, in addition to the face-to-face time of a patient visit (documented in the note above) non-face-to-face time: obtaining and reviewing outside history, ordering and reviewing medications, tests or procedures, care coordination  (communications with other health care professionals or caregivers) and documentation in the medical record.

## 2022-12-16 NOTE — Assessment & Plan Note (Signed)
Palpable lump in the right breast, mammogram detected 3.6 cm mass with several axillary lymph nodes, biopsy revealed grade 1-2 IDC, lymph nodes positive for cancer, ER 40%, PR 0%, Ki-67 15%, HER2 positive 3+ by IHC   Treatment plan: 1. Neoadjuvant chemotherapy with TCH Perjeta 6 cycles followed by Herceptin Perjeta maintenance for 1 year 2. Followed by lumpectomy and axillary node dissection completed on April 09, 2021 consistent with complete response 3.  Adjuvant radiation in [place] 4.  Followed by antiestrogen therapy with letrozole 2.5 mg daily started 06/14/2021; unable to tolerate, opted to forego all antiestrogen therapy and verbalized understanding of risks/benefits of AET.   Breast Cancer History of breast cancer with ongoing numbness in the arm post-surgery. Noted a new spot on the breast with tenderness. Mammogram scheduled for 12/25/2022 at [facility]. -Ensure mammogram is scheduled as a diagnostic mammogram. -Add-on ultrasound order and send to [facility]  Atrial Fibrillation and Tachycardia History of atrial fibrillation and tachycardia. No current symptoms reported. -Continue current management.  Hyperlipidemia On Crestor. -Continue Crestor.  General Health Maintenance Regular exercise with walking and playing with her dog. -Encourage continuation of regular exercise.  Follow-up in 1 year unless any issues arise.

## 2022-12-17 ENCOUNTER — Other Ambulatory Visit: Payer: Self-pay

## 2022-12-17 ENCOUNTER — Telehealth: Payer: Self-pay | Admitting: Hematology and Oncology

## 2022-12-17 ENCOUNTER — Telehealth: Payer: Self-pay

## 2022-12-17 DIAGNOSIS — C50411 Malignant neoplasm of upper-outer quadrant of right female breast: Secondary | ICD-10-CM

## 2022-12-17 NOTE — Telephone Encounter (Signed)
Called and left a message 11/4 and today requesting call back. Per Lisa Anes, DNP Lisa Chapman needs a bilateral diagnostic mammogram at scheduled appt on 11/13.  Lisa Chapman was seen in the office yesterday and has right upper breast tenderness.  She will need a ultrasound of the right breast on 11/13. Ask for a call back to the office so the office can fax orders.

## 2022-12-17 NOTE — Telephone Encounter (Signed)
Called Lisa Chapman and she will try to call [facility]. Told her that I had called yesterday and today to leave a detailed message to call the office back.

## 2022-12-17 NOTE — Telephone Encounter (Signed)
Spoke with patient confirming upcoming appointment  

## 2022-12-17 NOTE — Telephone Encounter (Signed)
Called Lisa Chapman back and told her that Lisa Chapman called from [facility] and gave [place] the fax #. The office has faxed the orders. Lisa Chapman verbalized understanding.  Faxed orders for ultrasound and mammogram to [facility] at [phone], received fax confirmation.

## 2022-12-25 DIAGNOSIS — Z170 Estrogen receptor positive status [ER+]: Secondary | ICD-10-CM | POA: Diagnosis not present

## 2022-12-25 DIAGNOSIS — R92323 Mammographic fibroglandular density, bilateral breasts: Secondary | ICD-10-CM | POA: Diagnosis not present

## 2022-12-25 DIAGNOSIS — N6459 Other signs and symptoms in breast: Secondary | ICD-10-CM | POA: Diagnosis not present

## 2022-12-25 DIAGNOSIS — N644 Mastodynia: Secondary | ICD-10-CM | POA: Diagnosis not present

## 2022-12-25 DIAGNOSIS — C50411 Malignant neoplasm of upper-outer quadrant of right female breast: Secondary | ICD-10-CM | POA: Diagnosis not present

## 2023-03-07 DIAGNOSIS — Z008 Encounter for other general examination: Secondary | ICD-10-CM | POA: Diagnosis not present

## 2023-03-18 ENCOUNTER — Telehealth: Payer: Self-pay | Admitting: Pharmacy Technician

## 2023-03-18 ENCOUNTER — Ambulatory Visit: Payer: Medicare HMO | Attending: Internal Medicine | Admitting: Internal Medicine

## 2023-03-18 ENCOUNTER — Encounter: Payer: Self-pay | Admitting: Internal Medicine

## 2023-03-18 ENCOUNTER — Other Ambulatory Visit (HOSPITAL_COMMUNITY): Payer: Self-pay

## 2023-03-18 VITALS — BP 130/72 | HR 67 | Ht 60.0 in | Wt 155.6 lb

## 2023-03-18 DIAGNOSIS — Z136 Encounter for screening for cardiovascular disorders: Secondary | ICD-10-CM

## 2023-03-18 MED ORDER — DILTIAZEM HCL 30 MG PO TABS: 30.0000 mg | ORAL_TABLET | Freq: Four times a day (QID) | ORAL | 3 refills | Status: DC | PRN

## 2023-03-18 NOTE — Telephone Encounter (Signed)
 Pharmacy Patient Advocate Encounter   Received notification from  chat  that prior authorization for eliquis is required/requested.   Insurance verification completed.   The patient is insured through [employer] .   Per test claim: The current 30 day co-pay is, $152.71.  No PA needed at this time. This test claim was processed through [facility]- copay amounts may vary at other pharmacies due to pharmacy/plan contracts, or as the patient moves through the different stages of their insurance plan.

## 2023-03-18 NOTE — Progress Notes (Signed)
 Cardiology Office Note:    Date:  03/18/2023   ID:  Lisa Chapman, DOB Jul 25, 1945, MRN [id number]  PCP:  Rosamond Leta NOVAK, MD   [facility] Providers Cardiologist:  Alvan Ronal BRAVO, MD     Referring MD: Rosamond Leta NOVAK, MD   No chief complaint on file. Cardio Oncology Patient; palpitations  History of Present Illness:    Lisa Chapman is a 78 y.o. female with a hx of  stage IIA ER+ , HER2+ of the R breast, undergoing her2 therapy followed by Dr. Gudena and Morna Kendall. She noted palpitations. She says during the summer her heart races. It can go up to 200 bpm. She checks with her husbands BP monitor. Her Bps are normal typically at home. No DM. No syncope. She noted it 2 weeks ago. She walks for exercise. She denies DOE or LH. No chest pain or pressure. TSH is normal.  CVD risk factors:  -Age - prior smoker, stopped in 1995. Smoked for 30 years  Prior Cardiac Dx: none   Interim hx 03/18/2023 She has noted afib symptoms. She can get tired. She note episodes are lasting longer. She is in sinus rhythm today. She is not taking BB, she is worried about the side effects. She is not taking xarelto  due to to cost. Her cancer payments are too high.   Cardiology Studies: 11/15/2020- nl LV function, GLS -17 % borderline, nl RV function, no significant valve dx. 02/12/2021- nl LV fxn, borderline strain GLS -17% 05/15/2021-nl LV fxn, GLS -20 % 08/10/2021-nl LV fxn, GLS -19 %  10/27/2020- NSR, diffuse TWI; more prominent anterolaterally  Past Medical History:  Diagnosis Date   Breast cancer (HCC)    Cancer (HCC) 2022   Breast Cancer   Diverticulosis    Dysrhythmia    Heart murmur    History of colonic polyps    History of DVT (deep vein thrombosis)    Right leg, April 2015   History of shingles    Mixed hyperlipidemia    Personal history of chemotherapy    Port-A-Cath in place 11/17/2020    Past Surgical History:  Procedure Laterality Date   BREAST LUMPECTOMY Right 04/09/2021    BREAST LUMPECTOMY WITH RADIOACTIVE SEED LOCALIZATION Right 04/09/2021   Procedure: RIGHT BREAST LUMPECTOMY WITH RADIOACTIVE SEED LOCALIZATION;  Surgeon: Vernetta Berg, MD;  Location: [facility];  Service: General;  Laterality: Right;   HERNIA REPAIR     MENISCUS REPAIR Left 03/2019   PORTACATH PLACEMENT Left 11/03/2020   Procedure: INSERTION PORT-A-CATH;  Surgeon: Vernetta Berg, MD;  Location: [facility];  Service: General;  Laterality: Left;   RADIOACTIVE SEED GUIDED AXILLARY SENTINEL LYMPH NODE Right 04/09/2021   Procedure: RADIOACTIVE SEED GUIDED RIGHT AXILLARY SENTINEL LYMPH NODE DISSECTION;  Surgeon: Vernetta Berg, MD;  Location: [facility];  Service: General;  Laterality: Right;   TONSILLECTOMY      Current Medications: Current Meds  Medication Sig   diltiazem  (CARDIZEM ) 30 MG tablet Take 1 tablet (30 mg total) by mouth every 6 (six) hours as needed.   metoprolol  succinate (TOPROL -XL) 25 MG 24 hr tablet Take 1 tablet (25 mg total) by mouth daily.   rivaroxaban  (XARELTO ) 20 MG TABS tablet TAKE 1 TABLET BY MOUTH DAILY WITH SUPPER   rosuvastatin (CRESTOR) 20 MG tablet Take 20 mg by mouth daily.     Allergies:   Other and Boniva [ibandronic acid]   Family History: The patient's family history includes COPD in  her father; Emphysema in her father; Heart attack in her father and mother. Deceased. Parents were >64 years old. No cardiac disease in her sibling.  ROS:   Please see the history of present illness.     All other systems reviewed and are negative.  EKGs/Labs/Other Studies Reviewed:    The following studies were reviewed today:   EKG:  EKG is  ordered today.  The ekg ordered today demonstrates   03/04/2022- NSR with sinus arrhythmia  EKG Interpretation Date/Time:  Tuesday March 18 2023 13:17:36 EST Ventricular Rate:  67 PR Interval:  148 QRS Duration:  72 QT Interval:  410 QTC Calculation: 433 R Axis:   6  Text  Interpretation: Normal sinus rhythm Possible Left atrial enlargement Left ventricular hypertrophy with repolarization abnormality ( R in aVL ) When compared with ECG of 27-Oct-2020 10:31, Nonspecific T wave abnormality has replaced inverted T waves in Anterior leads Confirmed by Alvan Shuck ([id number]) on 03/18/2023 1:35:36 PM   Recent Labs: No results found for requested labs within last 365 days.   Recent Lipid Panel No results found for: CHOL, TRIG, HDL, CHOLHDL, VLDL, LDLCALC, LDLDIRECT   Risk Assessment/Calculations:    CHA2DS2-VASc Score = 3   This indicates a 3.2% annual risk of stroke. The patient's score is based upon: CHF History: 0 HTN History: 0 Diabetes History: 0 Stroke History: 0 Vascular Disease History: 0 Age Score: 2 Gender Score: 1      Physical Exam:    VS:   Vitals:   03/18/23 1312  BP: 130/72  Pulse: 67  SpO2: 93%    Wt Readings from Last 3 Encounters:  03/18/23 155 lb 9.6 oz (70.6 kg)  12/16/22 154 lb 3.2 oz (69.9 kg)  03/04/22 146 lb (66.2 kg)     GEN:  Well nourished, well developed in no acute distress HEENT: Normal NECK: No JVD; No carotid bruits LYMPHATICS: No lymphadenopathy CARDIAC: RRR, no murmurs, rubs, gallops RESPIRATORY:  Clear to auscultation without rales, wheezing or rhonchi  ABDOMEN: Soft, non-tender, non-distended MUSCULOSKELETAL:  No edema; No deformity  SKIN: Warm and dry NEUROLOGIC:  Alert and oriented x 3 PSYCHIATRIC:  Normal affect   ASSESSMENT:   Paroxysmal Afib Normal EF, mild MR, GLS -19.  - she is worried about BB side effects, not taking - was not taking xarelto  2/2 cost ($150 per month) - start diltiazem  30 mg q6H PRN for symptoms  Her2+ BC: right sided, no plans for left sided XRT. She was treated with trastuzumab  + pertuzumab  11/17/2020-11/08/2021 s/p 1 year. No XRT (declined). S/p letrozole  06/14/2021.  GLS has been stable, borderline low. Bp are in good control at home. No signs of cardiac  dysfunction. No cardiotoxicity hx  DVT: on xarelto  20 mg daily  HLD: on crestor 20 mg daily  PLAN:    In order of problems listed above:   Follow up 3 months with an APP to FU on symptoms after starting dilt         Medication Adjustments/Labs and Tests Ordered: Current medicines are reviewed at length with the patient today.  Concerns regarding medicines are outlined above.  Orders Placed This Encounter  Procedures   EKG 12-Lead   Meds ordered this encounter  Medications   diltiazem  (CARDIZEM ) 30 MG tablet    Sig: Take 1 tablet (30 mg total) by mouth every 6 (six) hours as needed.    Dispense:  90 tablet    Refill:  3    Patient Instructions  Medication Instructions:  Start Diltiazem  30 mg every 6 hours as needed  *If you need a refill on your cardiac medications before your next appointment, please call your pharmacy*  Lab Work: None   Testing/Procedures: None   Follow-Up: At [facility], you and your health needs are our priority.  As part of our continuing mission to provide you with exceptional heart care, we have created designated Provider Care Teams.  These Care Teams include your primary Cardiologist (physician) and Advanced Practice Providers (APPs -  Physician Assistants and Nurse Practitioners) who all work together to provide you with the care you need, when you need it.  Your next appointment:   3 month(s)  Provider:   With any APP   Other Instructions         Signed, Alvan Ronal BRAVO, MD  03/18/2023 1:49 PM    [facility]

## 2023-03-18 NOTE — Telephone Encounter (Signed)
 Pharmacy Patient Advocate Encounter   Received notification from  chat  that prior authorization for xarelto  is required/requested.   Insurance verification completed.   The patient is insured through [employer] .   Per test claim: The current 30 day co-pay is, $150.63.  No PA needed at this time. This test claim was processed through [facility]- copay amounts may vary at other pharmacies due to pharmacy/plan contracts, or as the patient moves through the different stages of their insurance plan.

## 2023-03-18 NOTE — Patient Instructions (Addendum)
 Medication Instructions:  Start Diltiazem  30 mg every 6 hours as needed  *If you need a refill on your cardiac medications before your next appointment, please call your pharmacy*  Lab Work: None   Testing/Procedures: None   Follow-Up: At [facility], you and your health needs are our priority.  As part of our continuing mission to provide you with exceptional heart care, we have created designated Provider Care Teams.  These Care Teams include your primary Cardiologist (physician) and Advanced Practice Providers (APPs -  Physician Assistants and Nurse Practitioners) who all work together to provide you with the care you need, when you need it.  Your next appointment:   3 month(s)  Provider:   With any APP   Other Instructions

## 2023-06-16 ENCOUNTER — Ambulatory Visit: Payer: Medicare HMO | Admitting: Cardiology

## 2023-12-12 DIAGNOSIS — E7800 Pure hypercholesterolemia, unspecified: Secondary | ICD-10-CM | POA: Diagnosis not present

## 2023-12-12 DIAGNOSIS — R5383 Other fatigue: Secondary | ICD-10-CM | POA: Diagnosis not present

## 2023-12-12 DIAGNOSIS — Z79899 Other long term (current) drug therapy: Secondary | ICD-10-CM | POA: Diagnosis not present

## 2023-12-17 ENCOUNTER — Inpatient Hospital Stay: Payer: Medicare HMO | Attending: Hematology and Oncology | Admitting: Hematology and Oncology

## 2023-12-17 VITALS — BP 115/77 | HR 83 | Temp 97.6°F | Resp 18 | Ht 64.0 in | Wt 177.5 lb

## 2023-12-17 DIAGNOSIS — Z170 Estrogen receptor positive status [ER+]: Secondary | ICD-10-CM | POA: Insufficient documentation

## 2023-12-17 DIAGNOSIS — Z7901 Long term (current) use of anticoagulants: Secondary | ICD-10-CM | POA: Insufficient documentation

## 2023-12-17 DIAGNOSIS — Z1731 Human epidermal growth factor receptor 2 positive status: Secondary | ICD-10-CM | POA: Insufficient documentation

## 2023-12-17 DIAGNOSIS — Z1722 Progesterone receptor negative status: Secondary | ICD-10-CM | POA: Insufficient documentation

## 2023-12-17 DIAGNOSIS — Z79899 Other long term (current) drug therapy: Secondary | ICD-10-CM | POA: Insufficient documentation

## 2023-12-17 DIAGNOSIS — I4891 Unspecified atrial fibrillation: Secondary | ICD-10-CM | POA: Insufficient documentation

## 2023-12-17 DIAGNOSIS — Z79811 Long term (current) use of aromatase inhibitors: Secondary | ICD-10-CM | POA: Insufficient documentation

## 2023-12-17 DIAGNOSIS — C50411 Malignant neoplasm of upper-outer quadrant of right female breast: Secondary | ICD-10-CM | POA: Diagnosis not present

## 2023-12-17 DIAGNOSIS — Z1732 Human epidermal growth factor receptor 2 negative status: Secondary | ICD-10-CM | POA: Insufficient documentation

## 2023-12-17 NOTE — Assessment & Plan Note (Addendum)
 Palpable lump in the right breast, mammogram detected 3.6 cm mass with several axillary lymph nodes, biopsy revealed grade 1-2 IDC, lymph nodes positive for cancer, ER 40%, PR 0%, Ki-67 15%, HER2 positive 3+ by IHC   Treatment plan: 1. 11/17/2020 Neoadjuvant chemotherapy with [facility] Perjeta  6 cycles followed by Herceptin  Perjeta  maintenance for 1 year completed 11/08/2021 2. lumpectomy and axillary node dissection completed on April 09, 2021 consistent with complete response 3.  Adjuvant radiation in [place] 4.  Followed by antiestrogen therapy with anastrozole  1 mg daily started 06/14/2021 (switched from letrozole ) __________________________________________________________________________________ Current treatment: Anastrozole  started 06/14/2021   Anastrozole  toxicities:  Tolerating it well. (Itching to Letrozole )   Breast cancer surveillance: Breast exam 12/17/2023: Benign Mammogram Return to clinic in 1 year for follow-up

## 2023-12-17 NOTE — Progress Notes (Signed)
 Patient Care Team: Rosamond Leta NOVAK, MD as PCP - General (Internal Medicine) Alvan Ronal BRAVO, MD (Inactive) as PCP - Cardiology (Cardiology) Odean Potts, MD as Consulting Physician (Hematology and Oncology) Vernetta Berg, MD as Consulting Physician (General Surgery) Shari Sieving, MD as Consulting Physician (Orthopedic Surgery)  DIAGNOSIS:  Encounter Diagnosis  Name Primary?   Malignant neoplasm of upper-outer quadrant of right breast in female, estrogen receptor positive (HCC) Yes    SUMMARY OF ONCOLOGIC HISTORY: Oncology History  Malignant neoplasm of upper-outer quadrant of right breast in female, estrogen receptor positive (HCC)  10/04/2020 Initial Diagnosis   Palpable lump in the right breast, mammogram detected 3.6 cm mass with several axillary lymph nodes, biopsy revealed grade 1-2 IDC, lymph nodes positive for cancer, ER 40%, PR 0%, Ki-67 15%, HER2 positive 3+ by IHC   10/31/2020 Cancer Staging   Staging form: Breast, AJCC 8th Edition - Clinical stage from 10/31/2020: Stage IIA (cT2, cN1, cM0, G2, ER+, PR-, HER2+) - Signed by Odean Potts, MD on 10/31/2020 Stage prefix: Initial diagnosis Histologic grading system: 3 grade system   11/17/2020 - 03/01/2021 Chemotherapy   Patient is on Treatment Plan : BREAST  Docetaxel  + Carboplatin  + Trastuzumab  + Pertuzumab   (TCHP) q21d  x 6     03/23/2021 -  Chemotherapy   Herceptin /Perjeta  to complete one year of immunotherapy    04/09/2021 Surgery   Right lumpectomy and axillary node dissection: no residual cancer, 18 lymph nodes negative for cancer. ypT0,N0   04/09/2021 Cancer Staging   Staging form: Breast, AJCC 8th Edition - Pathologic stage from 04/09/2021: No Stage Recommended (ypT0, pN0, cM0) - Signed by Crawford Morna Pickle, NP on 07/26/2021 Stage prefix: Post-therapy   05/2021 -  Radiation Therapy   Declined adjuvant radiation therapy    06/14/2021 -  Anti-estrogen oral therapy   Letrozole  daily     CHIEF COMPLIANT:  Surveillance of breast cancer  HISTORY OF PRESENT ILLNESS:  History of Present Illness KEMONI QUESENBERRY is a 78 year old female with breast cancer and atrial fibrillation who presents with neuropathy and atrial fibrillation symptoms.  Neuropathy has been present for the past year, with numbness and pain in the feet and toes, progressively worsening and causing significant discomfort. There is uncertainty about its relation to previous chemotherapy.  Breast cancer history includes HER2 positivity and completion of a year of Herceptin  therapy. Estrogen receptor was weakly positive at 40%. She was on anastrozole  and letrozole  but discontinued these over two years ago.  Atrial fibrillation has been present for over two years, with episodes of sudden fatigue and heart rate increase to 160-170 bpm. Blood pressure remains stable, and she uses metoprolol  as needed.     ALLERGIES:  is allergic to other and boniva [ibandronate].  MEDICATIONS:  Current Outpatient Medications  Medication Sig Dispense Refill   diltiazem  (CARDIZEM ) 30 MG tablet Take 1 tablet (30 mg total) by mouth every 6 (six) hours as needed. 90 tablet 3   metoprolol  succinate (TOPROL -XL) 25 MG 24 hr tablet Take 1 tablet (25 mg total) by mouth daily. 90 tablet 1   rivaroxaban  (XARELTO ) 20 MG TABS tablet TAKE 1 TABLET BY MOUTH DAILY WITH SUPPER 30 tablet 3   rosuvastatin (CRESTOR) 20 MG tablet Take 20 mg by mouth daily.     No current facility-administered medications for this visit.    PHYSICAL EXAMINATION: ECOG PERFORMANCE STATUS: 1 - Symptomatic but completely ambulatory  Vitals:   12/17/23 1050  BP: 115/77  Pulse: 83  Resp: 18  Temp: 97.6 F (36.4 C)  SpO2: 100%   Filed Weights   12/17/23 1050  Weight: 177 lb 8 oz (80.5 kg)    Physical Exam   (exam performed in the presence of a chaperone)  LABORATORY DATA:  I have reviewed the data as listed    Latest Ref Rng & Units 11/08/2021    8:32 AM 10/18/2021   10:35  AM 09/06/2021    8:32 AM  CMP  Glucose 70 - 99 mg/dL 898  898  99   BUN 8 - 23 mg/dL 14  14  18    Creatinine 0.44 - 1.00 mg/dL 9.31  9.27  9.27   Sodium 135 - 145 mmol/L 141  140  140   Potassium 3.5 - 5.1 mmol/L 4.1  4.1  4.1   Chloride 98 - 111 mmol/L 104  106  107   CO2 22 - 32 mmol/L 29  29  29    Calcium 8.9 - 10.3 mg/dL 9.2  9.8  9.2   Total Protein 6.5 - 8.1 g/dL 7.9  8.2  7.5   Total Bilirubin 0.3 - 1.2 mg/dL 0.3  0.3  0.3   Alkaline Phos 38 - 126 U/L 67  69  63   AST 15 - 41 U/L 16  16  15    ALT 0 - 44 U/L 8  9  8      Lab Results  Component Value Date   WBC 6.6 11/08/2021   HGB 11.9 (L) 11/08/2021   HCT 36.5 11/08/2021   MCV 83.7 11/08/2021   PLT 238 11/08/2021   NEUTROABS 3.8 11/08/2021    ASSESSMENT & PLAN:  Malignant neoplasm of upper-outer quadrant of right breast in female, estrogen receptor positive (HCC) palpable lump in the right breast, mammogram detected 3.6 cm mass with several axillary lymph nodes, biopsy revealed grade 1-2 IDC, lymph nodes positive for cancer, ER 40%, PR 0%, Ki-67 15%, HER2 positive 3+ by IHC   Treatment plan: 1. Neoadjuvant chemotherapy with [facility] Perjeta  6 cycles followed by Herceptin  Perjeta  maintenance for 1 year completed 11/08/2021 2. lumpectomy and axillary node dissection completed on April 09, 2021 consistent with complete response 3.  Adjuvant radiation in [place] 4.  Followed by antiestrogen therapy with anastrozole  1 mg daily started 06/14/2021 (switched from letrozole ) discontinued in 2023 __________________________________________________________________________________ Current treatment: Surveillance   Breast cancer surveillance: Breast exam 12/17/2023: Benign Mammogram: Does at the right Center in [place] in a few weeks. Return to clinic in 1 year for follow-up and after that she could be followed on an as-needed basis   Assessment & Plan Malignant neoplasm of upper-outer quadrant of right breast, status post HER2-targeted  therapy, not currently on endocrine therapy Status post HER2-targeted therapy. Estrogen receptor 40% positive, HER2 positive. Previously on anastrozole , switched to letrozole  due to itching, but not taken for over two years. Benefits of endocrine therapy small due to partial estrogen receptor positivity. She prefers to remain off endocrine therapy.  Chemotherapy-induced peripheral neuropathy Peripheral neuropathy likely secondary to chemotherapy with symptoms of numbness and pain in feet and toes for about a year, worsening over time. - Continue current management as symptoms are manageable.      No orders of the defined types were placed in this encounter.  The patient has a good understanding of the overall plan. she agrees with it. she will call with any problems that may develop before the next visit here.  I personally spent a total of 30 minutes  in the care of the patient today including preparing to see the patient, getting/reviewing separately obtained history, performing a medically appropriate exam/evaluation, counseling and educating, placing orders, referring and communicating with other health care professionals, documenting clinical information in the EHR, independently interpreting results, communicating results, and coordinating care.   Viinay K Tyreanna Bisesi, MD 12/17/23

## 2023-12-29 DIAGNOSIS — E2839 Other primary ovarian failure: Secondary | ICD-10-CM | POA: Diagnosis not present

## 2024-01-13 DIAGNOSIS — Z1231 Encounter for screening mammogram for malignant neoplasm of breast: Secondary | ICD-10-CM | POA: Diagnosis not present

## 2024-03-18 ENCOUNTER — Emergency Department (HOSPITAL_COMMUNITY)

## 2024-03-18 ENCOUNTER — Inpatient Hospital Stay (HOSPITAL_COMMUNITY)
Admission: EM | Admit: 2024-03-18 | Source: Home / Self Care | Attending: Internal Medicine | Admitting: Internal Medicine

## 2024-03-18 ENCOUNTER — Encounter (HOSPITAL_COMMUNITY): Payer: Self-pay

## 2024-03-18 ENCOUNTER — Other Ambulatory Visit: Payer: Self-pay

## 2024-03-18 DIAGNOSIS — I480 Paroxysmal atrial fibrillation: Secondary | ICD-10-CM | POA: Diagnosis not present

## 2024-03-18 DIAGNOSIS — N179 Acute kidney failure, unspecified: Secondary | ICD-10-CM | POA: Diagnosis not present

## 2024-03-18 DIAGNOSIS — R634 Abnormal weight loss: Secondary | ICD-10-CM | POA: Diagnosis present

## 2024-03-18 DIAGNOSIS — J189 Pneumonia, unspecified organism: Secondary | ICD-10-CM | POA: Diagnosis present

## 2024-03-18 DIAGNOSIS — R0600 Dyspnea, unspecified: Secondary | ICD-10-CM

## 2024-03-18 DIAGNOSIS — A419 Sepsis, unspecified organism: Secondary | ICD-10-CM

## 2024-03-18 DIAGNOSIS — Z170 Estrogen receptor positive status [ER+]: Secondary | ICD-10-CM

## 2024-03-18 DIAGNOSIS — E782 Mixed hyperlipidemia: Secondary | ICD-10-CM | POA: Diagnosis present

## 2024-03-18 DIAGNOSIS — I4891 Unspecified atrial fibrillation: Secondary | ICD-10-CM

## 2024-03-18 DIAGNOSIS — I2699 Other pulmonary embolism without acute cor pulmonale: Secondary | ICD-10-CM | POA: Diagnosis not present

## 2024-03-18 DIAGNOSIS — I82409 Acute embolism and thrombosis of unspecified deep veins of unspecified lower extremity: Secondary | ICD-10-CM | POA: Diagnosis present

## 2024-03-18 DIAGNOSIS — C50411 Malignant neoplasm of upper-outer quadrant of right female breast: Secondary | ICD-10-CM

## 2024-03-18 DIAGNOSIS — J984 Other disorders of lung: Secondary | ICD-10-CM

## 2024-03-18 LAB — CBC WITH DIFFERENTIAL/PLATELET
Abs Immature Granulocytes: 0.07 10*3/uL (ref 0.00–0.07)
Basophils Absolute: 0 10*3/uL (ref 0.0–0.1)
Basophils Relative: 0 %
Eosinophils Absolute: 0 10*3/uL (ref 0.0–0.5)
Eosinophils Relative: 0 %
HCT: 40.5 % (ref 36.0–46.0)
Hemoglobin: 13 g/dL (ref 12.0–15.0)
Immature Granulocytes: 1 %
Lymphocytes Relative: 6 %
Lymphs Abs: 0.8 10*3/uL (ref 0.7–4.0)
MCH: 27.6 pg (ref 26.0–34.0)
MCHC: 32.1 g/dL (ref 30.0–36.0)
MCV: 86 fL (ref 80.0–100.0)
Monocytes Absolute: 0.9 10*3/uL (ref 0.1–1.0)
Monocytes Relative: 6 %
Neutro Abs: 12.6 10*3/uL — ABNORMAL HIGH (ref 1.7–7.7)
Neutrophils Relative %: 87 %
Platelets: 322 10*3/uL (ref 150–400)
RBC: 4.71 MIL/uL (ref 3.87–5.11)
RDW: 14.4 % (ref 11.5–15.5)
WBC: 14.5 10*3/uL — ABNORMAL HIGH (ref 4.0–10.5)
nRBC: 0 % (ref 0.0–0.2)

## 2024-03-18 LAB — PROCALCITONIN: Procalcitonin: 0.41 ng/mL

## 2024-03-18 LAB — COMPREHENSIVE METABOLIC PANEL WITH GFR
ALT: 28 U/L (ref 0–44)
AST: 40 U/L (ref 15–41)
Albumin: 3.7 g/dL (ref 3.5–5.0)
Alkaline Phosphatase: 76 U/L (ref 38–126)
Anion gap: 14 (ref 5–15)
BUN: 17 mg/dL (ref 8–23)
CO2: 21 mmol/L — ABNORMAL LOW (ref 22–32)
Calcium: 9.4 mg/dL (ref 8.9–10.3)
Chloride: 100 mmol/L (ref 98–111)
Creatinine, Ser: 1.03 mg/dL — ABNORMAL HIGH (ref 0.44–1.00)
GFR, Estimated: 56 mL/min — ABNORMAL LOW
Glucose, Bld: 119 mg/dL — ABNORMAL HIGH (ref 70–99)
Potassium: 4.4 mmol/L (ref 3.5–5.1)
Sodium: 135 mmol/L (ref 135–145)
Total Bilirubin: 0.6 mg/dL (ref 0.0–1.2)
Total Protein: 8.3 g/dL — ABNORMAL HIGH (ref 6.5–8.1)

## 2024-03-18 LAB — HEMOGLOBIN A1C
Hgb A1c MFr Bld: 6.4 % — ABNORMAL HIGH (ref 4.8–5.6)
Mean Plasma Glucose: 136.98 mg/dL

## 2024-03-18 LAB — TROPONIN T, HIGH SENSITIVITY
Troponin T High Sensitivity: 16 ng/L (ref 0–19)
Troponin T High Sensitivity: 16 ng/L (ref 0–19)

## 2024-03-18 LAB — PRO BRAIN NATRIURETIC PEPTIDE: Pro Brain Natriuretic Peptide: 1353 pg/mL — ABNORMAL HIGH

## 2024-03-18 LAB — LACTATE DEHYDROGENASE: LDH: 168 U/L (ref 105–235)

## 2024-03-18 LAB — C-REACTIVE PROTEIN: CRP: 30.3 mg/dL — ABNORMAL HIGH

## 2024-03-18 MED ORDER — IPRATROPIUM-ALBUTEROL 0.5-2.5 (3) MG/3ML IN SOLN: 3.0000 mL | RESPIRATORY_TRACT | Status: AC | PRN

## 2024-03-18 MED ORDER — SODIUM CHLORIDE 0.9 % IV SOLN
2.0000 g | INTRAVENOUS | Status: AC
  Administered 2024-03-18 – 2024-03-19 (×2): 2 g via INTRAVENOUS
  Filled 2024-03-18 (×2): qty 20

## 2024-03-18 MED ORDER — BENZONATATE 100 MG PO CAPS
100.0000 mg | ORAL_CAPSULE | Freq: Three times a day (TID) | ORAL | Status: AC
  Administered 2024-03-18 – 2024-03-19 (×5): 100 mg via ORAL
  Filled 2024-03-18 (×5): qty 1

## 2024-03-18 MED ORDER — PIPERACILLIN-TAZOBACTAM 3.375 G IVPB 30 MIN
3.3750 g | Freq: Once | INTRAVENOUS | Status: AC
  Administered 2024-03-18: 3.375 g via INTRAVENOUS
  Filled 2024-03-18: qty 50

## 2024-03-18 MED ORDER — ACETAMINOPHEN 650 MG RE SUPP: 650.0000 mg | Freq: Four times a day (QID) | RECTAL | Status: AC | PRN

## 2024-03-18 MED ORDER — ONDANSETRON HCL 4 MG/2ML IJ SOLN: 4.0000 mg | Freq: Four times a day (QID) | INTRAMUSCULAR | Status: AC | PRN

## 2024-03-18 MED ORDER — IPRATROPIUM-ALBUTEROL 0.5-2.5 (3) MG/3ML IN SOLN
3.0000 mL | Freq: Four times a day (QID) | RESPIRATORY_TRACT | Status: AC
  Administered 2024-03-18 – 2024-03-19 (×5): 3 mL via RESPIRATORY_TRACT
  Filled 2024-03-18 (×6): qty 3

## 2024-03-18 MED ORDER — IOHEXOL 350 MG/ML SOLN
75.0000 mL | Freq: Once | INTRAVENOUS | Status: AC | PRN
  Administered 2024-03-18: 75 mL via INTRAVENOUS

## 2024-03-18 MED ORDER — GUAIFENESIN ER 600 MG PO TB12
600.0000 mg | ORAL_TABLET | Freq: Two times a day (BID) | ORAL | Status: AC
  Administered 2024-03-18 – 2024-03-19 (×3): 600 mg via ORAL
  Filled 2024-03-18 (×3): qty 1

## 2024-03-18 MED ORDER — SODIUM CHLORIDE 0.9 % IV SOLN: 500.0000 mg | INTRAVENOUS | Status: DC

## 2024-03-18 MED ORDER — DILTIAZEM HCL-DEXTROSE 125-5 MG/125ML-% IV SOLN (PREMIX)
5.0000 mg/h | INTRAVENOUS | Status: DC
  Administered 2024-03-18: 5 mg/h via INTRAVENOUS
  Administered 2024-03-19: 10 mg/h via INTRAVENOUS
  Filled 2024-03-18 (×4): qty 125

## 2024-03-18 MED ORDER — AZITHROMYCIN 250 MG PO TABS
500.0000 mg | ORAL_TABLET | Freq: Every day | ORAL | Status: AC
  Administered 2024-03-18 – 2024-03-19 (×2): 500 mg via ORAL
  Filled 2024-03-18 (×2): qty 2

## 2024-03-18 MED ORDER — SODIUM CHLORIDE 0.9 % IV SOLN
2.0000 g | INTRAVENOUS | Status: DC
  Administered 2024-03-19: 2 g via INTRAVENOUS
  Filled 2024-03-18: qty 20

## 2024-03-18 MED ORDER — BISACODYL 5 MG PO TBEC: 5.0000 mg | DELAYED_RELEASE_TABLET | Freq: Every day | ORAL | Status: AC | PRN

## 2024-03-18 MED ORDER — HEPARIN BOLUS VIA INFUSION
4500.0000 [IU] | Freq: Once | INTRAVENOUS | Status: AC
  Administered 2024-03-18: 4500 [IU] via INTRAVENOUS
  Filled 2024-03-18: qty 4500

## 2024-03-18 MED ORDER — LIDOCAINE 5 % EX PTCH
1.0000 | MEDICATED_PATCH | CUTANEOUS | Status: AC
  Administered 2024-03-18: 1 via TRANSDERMAL
  Filled 2024-03-18: qty 1

## 2024-03-18 MED ORDER — VANCOMYCIN HCL 1250 MG/250ML IV SOLN
1250.0000 mg | Freq: Once | INTRAVENOUS | Status: AC
  Administered 2024-03-18: 1250 mg via INTRAVENOUS
  Filled 2024-03-18: qty 250

## 2024-03-18 MED ORDER — HEPARIN (PORCINE) 25000 UT/250ML-% IV SOLN
1550.0000 [IU]/h | INTRAVENOUS | Status: AC
  Administered 2024-03-18: 1050 [IU]/h via INTRAVENOUS
  Administered 2024-03-19: 1200 [IU]/h via INTRAVENOUS
  Administered 2024-03-19: 1550 [IU]/h via INTRAVENOUS
  Filled 2024-03-18 (×3): qty 250

## 2024-03-18 MED ORDER — ACETAMINOPHEN 325 MG PO TABS: 650.0000 mg | ORAL_TABLET | Freq: Four times a day (QID) | ORAL | Status: AC | PRN

## 2024-03-18 MED ORDER — DOCUSATE SODIUM 100 MG PO CAPS
100.0000 mg | ORAL_CAPSULE | Freq: Two times a day (BID) | ORAL | Status: AC
  Administered 2024-03-18 – 2024-03-19 (×2): 100 mg via ORAL
  Filled 2024-03-18 (×3): qty 1

## 2024-03-18 MED ORDER — ONDANSETRON HCL 4 MG PO TABS: 4.0000 mg | ORAL_TABLET | Freq: Four times a day (QID) | ORAL | Status: AC | PRN

## 2024-03-18 NOTE — Progress Notes (Signed)
 ANTICOAGULATION CONSULT NOTE  Pharmacy Consult for Heparin  Indication: pulmonary embolus  Allergies[1]  Patient Measurements: Height: 5' (152.4 cm) Weight: 66.2 kg (146 lb) IBW/kg (Calculated) : 45.5 Heparin  Dosing Weight: 59.7 kg  Vital Signs: Temp: 99.8 F (37.7 C) (02/05 1245) Temp Source: Oral (02/05 1245) BP: 136/80 (02/05 1600) Pulse Rate: 156 (02/05 1600)  Labs: Recent Labs    03/18/24 1334  HGB 13.0  HCT 40.5  PLT 322  CREATININE 1.03*    Estimated Creatinine Clearance: 39.5 mL/min (A) (by C-G formula based on SCr of 1.03 mg/dL (H)).   Medical History: Past Medical History:  Diagnosis Date   Breast cancer (HCC)    Cancer (HCC) 2022   Breast Cancer   Diverticulosis    Dysrhythmia    Heart murmur    History of colonic polyps    History of DVT (deep vein thrombosis)    Right leg, April 2015   History of shingles    Mixed hyperlipidemia    Personal history of chemotherapy    Port-A-Cath in place 11/17/2020    Medications:  (Not in a hospital admission)  Scheduled:   azithromycin   500 mg Oral Daily   Infusions:   diltiazem  (CARDIZEM ) infusion 10 mg/hr (03/18/24 1614)   piperacillin -tazobactam 3.375 g (03/18/24 1631)   vancomycin      PRN:   Assessment: 78 yof presenting with SOB. Heparin  per pharmacy consult placed for pulmonary embolus.  CTA PE c/f PE  Patient is not on anticoagulation prior to arrival.  Hgb 13; plt 322  Goal of Therapy:  Heparin  level 0.3-0.7 units/ml Monitor platelets by anticoagulation protocol: Yes   Plan:  Give IV heparin  4500 units bolus x 1 Start heparin  infusion at 1050 units/hr Check anti-Xa level in 8 hours and daily while on heparin  Continue to monitor H&H and platelets  Dorn Buttner, PharmD, BCPS 03/18/2024 4:51 PM ED Clinical Pharmacist -  [phone]       [1]  Allergies Allergen Reactions   Other Hives    Seasoning in Glory food product. Unsure of exactly what spice.    Boniva  [Ibandronate] Other (See Comments)    Epigastric discomfort

## 2024-03-18 NOTE — Consult Note (Signed)
 "  NAME:  Lisa Chapman, MRN:  [id number], DOB:  August 02, 1945, LOS: 0 ADMISSION DATE:  03/18/2024, CONSULTATION DATE:  03/18/24 REFERRING MD:  TRH CHIEF COMPLAINT:  Pulmonary Emboli and Pneumonia   History of Present Illness:  Lisa Chapman is a 79 year old woman with history of breast cancer s/p chemotherapy and lumpectomy in 2023 followed by hormone therapy for 2 years and DVT 05/2013 who presents with multiple weeks of feeling bad, chills, shortness of breath and chest pains.   CTA PE study showed left lower lobar pulmonary emboli and 20 x 18mm rounded abnormality in the left lower lobe with hypodense center. Patchy opacity noted in the right lower lobe.   She was noted to be in atrial fibrillation with RVR and started on diltiazem  drip.   PCCM consulted for evaluation of PE and pneumonia.  Pertinent  Medical History   Past Medical History:  Diagnosis Date   Breast cancer (HCC)    Cancer (HCC) 2022   Breast Cancer   Diverticulosis    Dysrhythmia    Heart murmur    History of colonic polyps    History of DVT (deep vein thrombosis)    Right leg, April 2015   History of shingles    Mixed hyperlipidemia    Personal history of chemotherapy    Port-A-Cath in place 11/17/2020   Significant Hospital Events: Including procedures, antibiotic start and stop dates in addition to other pertinent events   2/5 admitted  Interim History / Subjective:  As above  Objective    Blood pressure 136/80, pulse (!) 156, temperature 99.8 F (37.7 C), temperature source Oral, resp. rate (!) 27, height 5' (1.524 m), weight 66.2 kg, SpO2 90%.        Intake/Output Summary (Last 24 hours) at 03/18/2024 1704 Last data filed at 03/18/2024 1614 Gross per 24 hour  Intake 10.68 ml  Output --  Net 10.68 ml   Filed Weights   03/18/24 1314  Weight: 66.2 kg    Examination: General: elderly woman, lying in bed, no distress HENT: [place]/AT, moist mucous membranes Lungs: diminishes breath sounds no  wheezing Cardiovascular: tacycardic irregularly irregular, no murmurs Abdomen: soft, non-tender, non-distended, BS+ Extremities: warm, no edema Neuro: alert, moving all extremities GU: n/a  Resolved problem list   Assessment and Plan   Acute Pulmonary Embolism Sepsis due to Right Lower Lobe Pneumonia Atrial Fibrillation with RVR AKI Elevated BNP  Plan: - start heparin  drip for PE per pharmacy protocol - Continue ceftriaxone  and azithromycin  for antibiotics - Received vancomycin  and zosyn  in ER - check MRSA PCR, urine legionella and urine strep pneumo ag - check blood cultures - will need follow up CT Chest in 6-8 weeks to ensure resolution of current findings given history of breast cancer  PCCM will follow  Labs   CBC: Recent Labs  Lab 03/18/24 1334  WBC 14.5*  NEUTROABS 12.6*  HGB 13.0  HCT 40.5  MCV 86.0  PLT 322    Basic Metabolic Panel: Recent Labs  Lab 03/18/24 1334  NA 135  K 4.4  CL 100  CO2 21*  GLUCOSE 119*  BUN 17  CREATININE 1.03*  CALCIUM 9.4   GFR: Estimated Creatinine Clearance: 39.5 mL/min (A) (by C-G formula based on SCr of 1.03 mg/dL (H)). Recent Labs  Lab 03/18/24 1334  WBC 14.5*    Liver Function Tests: Recent Labs  Lab 03/18/24 1334  AST 40  ALT 28  ALKPHOS 76  BILITOT 0.6  PROT  8.3*  ALBUMIN 3.7   No results for input(s): LIPASE, AMYLASE in the last 168 hours. No results for input(s): AMMONIA in the last 168 hours.  ABG No results found for: PHART, PCO2ART, PO2ART, HCO3, TCO2, ACIDBASEDEF, O2SAT   Coagulation Profile: No results for input(s): INR, PROTIME in the last 168 hours.  Cardiac Enzymes: No results for input(s): CKTOTAL, CKMB, CKMBINDEX, TROPONINI in the last 168 hours.  HbA1C: No results found for: HGBA1C  CBG: No results for input(s): GLUCAP in the last 168 hours.  Review of Systems:   Review of Systems  Constitutional:  Positive for chills and  malaise/fatigue. Negative for fever and weight loss.  HENT:  Negative for congestion, sinus pain and sore throat.   Eyes: Negative.   Respiratory:  Positive for cough and shortness of breath. Negative for hemoptysis, sputum production and wheezing.   Cardiovascular:  Positive for chest pain. Negative for palpitations, orthopnea, claudication and leg swelling.  Gastrointestinal:  Negative for abdominal pain, heartburn, nausea and vomiting.  Genitourinary: Negative.   Musculoskeletal:  Negative for joint pain and myalgias.  Skin:  Negative for rash.  Neurological:  Negative for weakness.  Endo/Heme/Allergies: Negative.   Psychiatric/Behavioral: Negative.       Past Medical History:  She,  has a past medical history of Breast cancer (HCC), Cancer (HCC) (2022), Diverticulosis, Dysrhythmia, Heart murmur, History of colonic polyps, History of DVT (deep vein thrombosis), History of shingles, Mixed hyperlipidemia, Personal history of chemotherapy, and Port-A-Cath in place (11/17/2020).   Surgical History:   Past Surgical History:  Procedure Laterality Date   BREAST LUMPECTOMY Right 04/09/2021   BREAST LUMPECTOMY WITH RADIOACTIVE SEED LOCALIZATION Right 04/09/2021   Procedure: RIGHT BREAST LUMPECTOMY WITH RADIOACTIVE SEED LOCALIZATION;  Surgeon: Vernetta Berg, MD;  Location: [facility];  Service: General;  Laterality: Right;   HERNIA REPAIR     MENISCUS REPAIR Left 03/2019   PORTACATH PLACEMENT Left 11/03/2020   Procedure: INSERTION PORT-A-CATH;  Surgeon: Vernetta Berg, MD;  Location: [facility];  Service: General;  Laterality: Left;   RADIOACTIVE SEED GUIDED AXILLARY SENTINEL LYMPH NODE Right 04/09/2021   Procedure: RADIOACTIVE SEED GUIDED RIGHT AXILLARY SENTINEL LYMPH NODE DISSECTION;  Surgeon: Vernetta Berg, MD;  Location: [facility];  Service: General;  Laterality: Right;   TONSILLECTOMY       Social History:   reports that she has never smoked. She  has never used smokeless tobacco. She reports that she does not drink alcohol  and does not use drugs.   Family History:  Her family history includes COPD in her father; Emphysema in her father; Heart attack in her father and mother.   Allergies Allergies[1]   Home Medications  Prior to Admission medications  Medication Sig Start Date End Date Taking? Authorizing Provider  rosuvastatin (CRESTOR) 20 MG tablet Take 20 mg by mouth daily.   Yes [provider]     Critical care time: n/a    Dorn Chill, MD [facility] Office: [phone]   See Amion for personal pager PCCM on call pager [phone] until 7pm. Please call Elink 7p-7a. [phone]           [1]  Allergies Allergen Reactions   Other Hives    Seasoning in Glory food product. Unsure of exactly what spice.    Boniva [Ibandronate] Other (See Comments)    Epigastric discomfort   "

## 2024-03-18 NOTE — Progress Notes (Signed)
 ED Pharmacy Antibiotic Sign Off An antibiotic consult was received from an ED provider for zosyn  per pharmacy dosing for pneumonia. A chart review was completed to assess appropriateness.  A single dose of vancomycin  1250 mg was placed by the ED provider.   The following one time order(s) were placed per pharmacy consult:  zosyn  3.375g x 1 dose  Further antibiotic and/or antibiotic pharmacy consults should be ordered by the admitting provider if indicated.   Thank you for allowing pharmacy to be a part of this patient's care.   Dorn Buttner, PharmD, BCPS 03/18/2024 3:57 PM ED Clinical Pharmacist -  [phone]

## 2024-03-18 NOTE — ED Triage Notes (Signed)
 BIB spouse from [facility]. Sent for sob and R sided pleuritic CP. Stable in office, but reports SPO2 91%, HR 177. H/o PAF, and recurrent DVT, PE. Takes xarelto . Pt alert, NAD, calm, interactive, resps e/u, speaking in clear complete sentences. Steady gait.

## 2024-03-18 NOTE — H&P (Signed)
 " History and Physical  Patient: Lisa Chapman FMW:[id number] DOB: April 09, 1945 DOA: 03/18/2024 DOS: the patient was seen and examined on 03/18/2024 Patient coming from: Home  Chief Complaint: Brought in as patient is having progressively worsening shortness of breath  HPI: Patient with PMH of breast cancer, HLD, recurrent DVT, paroxysmal A-fib present to the hospital with complaints of shortness of breath. She reports that for the last 2 months since December she starts to have progressively worsening fatigue and shortness of breath especially on exertion. It has been difficult for her to get through about in January. She started having some complaints of chest pain on right side since last 2 weeks. She reports chills with night sweats since last 1 week.  No fever. She has ongoing cough which is unchanged and nonproductive since last 32-month. She denies any rash or lumps or bumps or change in her medications lately. She is supposed to be on Xarelto  but she stopped taking that medication for last 2 years. Primarily due to cost. She denies any active smoking.  No nausea no vomiting.  No alcohol  abuse. Denies any choking episode. No diarrhea no constipation.  No active bleeding.  Assessment and plan. Segmental PE. Acute hypoxia. Likely acute PE but subacute presentation cannot be ruled out given chronicity of her symptoms. Currently started on IV heparin . Recommended that she will require lifelong anticoagulation. Lower extremity Doppler ordered. Monitor on telemetry.  A-fib with RVR.  Paroxysmal. Patient has prior history of paroxysmal A-fib.  Now presented with RVR. Likely cause of her shortness of breath and dyspnea as well as possible cough. No nausea vomiting or diarrhea.  No recent change in medication. Does not take any rate control medication at her baseline. Not taken anticoagulation secondary to cost. Currently on Cardizem  infusion. Echocardiogram ordered. Check TSH and free T4  in the morning. Appears to be having some P waves on telemetry at the time of my evaluation.  Possible right-sided pneumonia. Left lung cavitary lesion. Patient with pleuritic chest pain on the right especially worsened with deep breath and cough. No nausea or vomiting no aspiration event reported by the patient. CT scan of the chest shows evidence of possibility for pneumonia on the right side. Currently on IV ceftriaxone  Mycin. Check procalcitonin and CRP. There is significant cavitary appearing lesion on the left chest area.  Possible pneumonia versus infarction. Pulmonary consulted appreciate consult.  History of breast cancer. Appears to be stable for now. Although would require further workup given presence of this cavitary lesion on CT.  Coronary artery disease. Seen on CT scan. Further workup outpatient recommended.  Possible mild AKI. Baseline creatinine appears to be around 0.7. Creatinine upon admission was 1.03. Monitor renal function.  Possible acute on chronic HFpEF. Echocardiogram ordered. Mildly volume ordered with swelling of the legs. May need more workup pending echocardiogram.  Advance Care Planning:   Code Status: Full Code discussed in detail.  She would like her husband to be her HCPOA. Consults: Pulmonary  Prior to Admission medications  Medication Sig Start Date End Date Taking? Authorizing Provider  ibuprofen (ADVIL) 200 MG tablet Take 400 mg by mouth every 6 (six) hours as needed for mild pain (pain score 1-3), moderate pain (pain score 4-6) or headache.   Yes [provider]  rosuvastatin (CRESTOR) 20 MG tablet Take 20 mg by mouth daily.   Yes [provider]    Past Medical History:  Diagnosis Date   Breast cancer (HCC)    Cancer (HCC)  2022   Breast Cancer   Diverticulosis    Dysrhythmia    Heart murmur    History of colonic polyps    History of DVT (deep vein thrombosis)    Right leg, April 2015   History of shingles     Mixed hyperlipidemia    Personal history of chemotherapy    Port-A-Cath in place 11/17/2020   Past Surgical History:  Procedure Laterality Date   BREAST LUMPECTOMY Right 04/09/2021   BREAST LUMPECTOMY WITH RADIOACTIVE SEED LOCALIZATION Right 04/09/2021   Procedure: RIGHT BREAST LUMPECTOMY WITH RADIOACTIVE SEED LOCALIZATION;  Surgeon: Vernetta Berg, MD;  Location: [facility];  Service: General;  Laterality: Right;   HERNIA REPAIR     MENISCUS REPAIR Left 03/2019   PORTACATH PLACEMENT Left 11/03/2020   Procedure: INSERTION PORT-A-CATH;  Surgeon: Vernetta Berg, MD;  Location: [facility];  Service: General;  Laterality: Left;   RADIOACTIVE SEED GUIDED AXILLARY SENTINEL LYMPH NODE Right 04/09/2021   Procedure: RADIOACTIVE SEED GUIDED RIGHT AXILLARY SENTINEL LYMPH NODE DISSECTION;  Surgeon: Vernetta Berg, MD;  Location: [facility];  Service: General;  Laterality: Right;   TONSILLECTOMY     Social History:  reports that she has never smoked. She has never used smokeless tobacco. She reports that she does not drink alcohol  and does not use drugs. Allergies[1] Family History  Problem Relation Age of Onset   Heart attack Mother    COPD Father    Heart attack Father    Emphysema Father    Physical Exam: Vitals:   03/18/24 1545 03/18/24 1600 03/18/24 1749 03/18/24 1753  BP: 106/88 136/80    Pulse: (!) 118 (!) 156 (!) 107   Resp: 19 (!) 27 (!) 26   Temp:    98.7 F (37.1 C)  TempSrc:    Oral  SpO2: 90% 90% 93%   Weight:      Height:       Basal crackles.  No wheezing. Pupils are equal and reactive to light.  Oral mucosa is moist and tongue midline. S1-S2 present.  Aortic systolic murmur heard. Bowel sound present. Bilateral lower extremity edema.  No focal deficit.  Data Reviewed: I have reviewed ED notes, Vitals, Lab results and outpatient records. Since last encounter, pertinent lab results CBC and BMP   . I have ordered test including CBC  and BMP  . I have discussed pt's care plan and test results with pulmonary.  . I have ordered imaging echocardiogram  .   Family Communication: No one at bedside  Author: Yetta Blanch, MD 03/18/2024 6:27 PM For on call review [email or web address].      [1]  Allergies Allergen Reactions   Other Hives    Seasoning in Glory food product. Unsure of exactly what spice.    Boniva [Ibandronate] Other (See Comments)    Epigastric discomfort   "

## 2024-03-18 NOTE — ED Notes (Signed)
 Pulmonology at bedside.

## 2024-03-18 NOTE — ED Provider Notes (Signed)
 Received patient in turnover from Dr. Dreama.  Please see their note for further details of Hx, PE.  Briefly patient is a 79 y.o. female with a Shortness of Breath .  Having sob, R sided chest pain.  Plan for CTa and admit.  CT angiogram is concerning for PE.  Also possible pneumonia versus cancer.  I discussed this with pulmonology who will see at bedside.  They have seen the patient and plan to start her on heparin .  Discussed with hospitalist for admission.  CRITICAL CARE Performed by: Toribio Belvie Quale   Total critical care time: 35 minutes  Critical care time was exclusive of separately billable procedures and treating other patients.  Critical care was necessary to treat or prevent imminent or life-threatening deterioration.  Critical care was time spent personally by me on the following activities: development of treatment plan with patient and/or surrogate as well as nursing, discussions with consultants, evaluation of patient's response to treatment, examination of patient, obtaining history from patient or surrogate, ordering and performing treatments and interventions, ordering and review of laboratory studies, ordering and review of radiographic studies, pulse oximetry and re-evaluation of patient's condition.     Quale Share, DO 03/18/24 [phone]

## 2024-03-18 NOTE — Evaluation (Signed)
 RT Evaluate and Treat Note  03/18/2024   Breathing is (select one): Worse than normal   The following was found on auscultation (select multiple):  Bilateral Breath Sounds: Diminished;Coarse crackles (03/18/24 1749)             Cough Assessment:      Most Recent Chest Xray:... (DG Chest 2 View Result Date: 03/18/2024 EXAM: 2 VIEW(S) XRAY OF THE CHEST 03/18/2024 01:51:00 PM COMPARISON: None available. CLINICAL HISTORY: Shortness of breath, pleuritic chest pain. FINDINGS: LUNGS AND PLEURA: Low lung volumes. Ill-defined opacities in bilateral lung bases. Small bilateral pleural effusions. No pneumothorax. HEART AND MEDIASTINUM: Mild cardiomegaly. BONES AND SOFT TISSUES: No acute osseous abnormality. IMPRESSION: 1. Small bilateral pleural effusions. 2. Ill-defined opacities in the bilateral lung bases, possibly related to low lung volumes. 3. Mild cardiomegaly. Electronically signed by: Norleen Boxer MD 03/18/2024 02:02 PM EST RP Workstation: [id number]      The following medications and/or interventions were ordered/changed/discontinued as part of the Respiratory Treatment protocol:   Medication Changes: Duoneb Ordered    Airway Clearance Changes: No Change   Oxygen Therapy Changes:No Change

## 2024-03-18 NOTE — ED Provider Notes (Signed)
 " [place] EMERGENCY DEPARTMENT AT [facility] Provider Note   CSN: [id number] Arrival date & time: 03/18/24  1242     Patient presents with: Shortness of Breath   Lisa Chapman is a 79 y.o. female.  {Add pertinent medical, surgical, social history, OB history to HPI:32947} HPI      1.5 months ago sick then downhill since Sweating, short of breath, hot one minute cold then next minute hot Dyspnea progressively getting worse, now doesn't have to walk any distance to feel it. Will take a shower but need to hold onto rail because of her shortness of breath and generalized weakness BP was 97/66, pulse in 100s Dizziness/lightheadedness Right lower chest pain and through to the back, notice it with cough, sneeze and deep breath for days No nausea or vomiting Taking xarelto  has not missed any doses Slight cough, not coughing things up, even when sick and coughing not coughing anything up but lungs felt like something going on No leg pain or swelling No fever, but is feeling hot and cold  No smoking, no copd or asthma or lung disease hx, no history of known heart disease or PE but has dvt hx and taking xarelto    Past Medical History:  Diagnosis Date   Breast cancer (HCC)    Cancer (HCC) 2022   Breast Cancer   Diverticulosis    Dysrhythmia    Heart murmur    History of colonic polyps    History of DVT (deep vein thrombosis)    Right leg, April 2015   History of shingles    Mixed hyperlipidemia    Personal history of chemotherapy    Port-A-Cath in place 11/17/2020     Prior to Admission medications  Medication Sig Start Date End Date Taking? Authorizing Provider  diltiazem  (CARDIZEM ) 30 MG tablet Take 1 tablet (30 mg total) by mouth every 6 (six) hours as needed. 03/18/23   Alvan Ronal BRAVO, MD  metoprolol  succinate (TOPROL -XL) 25 MG 24 hr tablet Take 1 tablet (25 mg total) by mouth daily. 09/24/22   Alvan Ronal BRAVO, MD  rivaroxaban  (XARELTO ) 20 MG TABS tablet TAKE 1  TABLET BY MOUTH DAILY WITH SUPPER 08/16/22   Gudena, Vinay, MD  rosuvastatin (CRESTOR) 20 MG tablet Take 20 mg by mouth daily.    [provider]    Allergies: Other and Boniva [ibandronate]    Review of Systems  Updated Vital Signs BP 136/78   Pulse (!) 135   Temp 99.8 F (37.7 C) (Oral)   Resp 18   Ht 5' (1.524 m)   Wt 66.2 kg   SpO2 94%   BMI 28.51 kg/m   Physical Exam  (all labs ordered are listed, but only abnormal results are displayed) Labs Reviewed  CBC WITH DIFFERENTIAL/PLATELET  COMPREHENSIVE METABOLIC PANEL WITH GFR  PROTIME-INR  TROPONIN T, HIGH SENSITIVITY    EKG: EKG Interpretation Date/Time:  Thursday March 18 2024 13:22:42 EST Ventricular Rate:  124 PR Interval:  176 QRS Duration:  70 QT Interval:  300 QTC Calculation: 431 R Axis:   24  Text Interpretation: Sinus tachycardia Minimal voltage criteria for LVH, may be normal variant ( R in aVL ) Nonspecific ST and T wave abnormality Abnormal ECG When compared with ECG of 18-Mar-2023 13:17, No significant change since last tracing with exception of elevated rate, portion of ECG less clearly sinus tachycardia Confirmed by Dreama Longs ([id number]) on 03/18/2024 1:34:57 PM  Radiology: No results found.  {Document cardiac  monitor, telemetry assessment procedure when appropriate:32947} Procedures   Medications Ordered in the ED - No data to display    {Click here for ABCD2, HEART and other calculators REFRESH Note before signing:1}                              Medical Decision Making Amount and/or Complexity of Data Reviewed Labs: ordered. Radiology: ordered.   ***  {Document critical care time when appropriate  Document review of labs and clinical decision tools ie CHADS2VASC2, etc  Document your independent review of radiology images and any outside records  Document your discussion with family members, caretakers and with consultants  Document social determinants of health affecting  pt's care  Document your decision making why or why not admission, treatments were needed:32947:::1}   Final diagnoses:  None    ED Discharge Orders     None        "

## 2024-03-19 ENCOUNTER — Telehealth (HOSPITAL_COMMUNITY): Payer: Self-pay | Admitting: Pharmacy Technician

## 2024-03-19 ENCOUNTER — Inpatient Hospital Stay (HOSPITAL_COMMUNITY)

## 2024-03-19 ENCOUNTER — Telehealth: Payer: Self-pay

## 2024-03-19 ENCOUNTER — Other Ambulatory Visit (HOSPITAL_COMMUNITY): Payer: Self-pay

## 2024-03-19 DIAGNOSIS — I2699 Other pulmonary embolism without acute cor pulmonale: Secondary | ICD-10-CM

## 2024-03-19 LAB — BASIC METABOLIC PANEL WITH GFR
Anion gap: 15 (ref 5–15)
BUN: 11 mg/dL (ref 8–23)
CO2: 21 mmol/L — ABNORMAL LOW (ref 22–32)
Calcium: 8.7 mg/dL — ABNORMAL LOW (ref 8.9–10.3)
Chloride: 99 mmol/L (ref 98–111)
Creatinine, Ser: 0.81 mg/dL (ref 0.44–1.00)
GFR, Estimated: >60 mL/min
Glucose, Bld: 118 mg/dL — ABNORMAL HIGH (ref 70–99)
Potassium: 3.4 mmol/L — ABNORMAL LOW (ref 3.5–5.1)
Sodium: 135 mmol/L (ref 135–145)

## 2024-03-19 LAB — ECHOCARDIOGRAM COMPLETE
AR max vel: 2.02 cm2
AV Area VTI: 2.07 cm2
AV Area mean vel: 2.03 cm2
AV Mean grad: 7.3 mmHg
AV Peak grad: 14 mmHg
Ao pk vel: 1.87 m/s
Area-P 1/2: 3.5 cm2
Height: 60 in
S' Lateral: 2.5 cm
Weight: 2336 [oz_av]

## 2024-03-19 LAB — CBC
HCT: 32.1 % — ABNORMAL LOW (ref 36.0–46.0)
Hemoglobin: 10.4 g/dL — ABNORMAL LOW (ref 12.0–15.0)
MCH: 27.8 pg (ref 26.0–34.0)
MCHC: 32.4 g/dL (ref 30.0–36.0)
MCV: 85.8 fL (ref 80.0–100.0)
Platelets: 375 10*3/uL (ref 150–400)
RBC: 3.74 MIL/uL — ABNORMAL LOW (ref 3.87–5.11)
RDW: 14.5 % (ref 11.5–15.5)
WBC: 17.6 10*3/uL — ABNORMAL HIGH (ref 4.0–10.5)
nRBC: 0 % (ref 0.0–0.2)

## 2024-03-19 LAB — CULTURE, BLOOD (ROUTINE X 2)
Culture: NO GROWTH
Culture: NO GROWTH

## 2024-03-19 LAB — HEPARIN LEVEL (UNFRACTIONATED)
Heparin Unfractionated: 0.14 [IU]/mL — ABNORMAL LOW (ref 0.30–0.70)
Heparin Unfractionated: 0.13 [IU]/mL — ABNORMAL LOW (ref 0.30–0.70)
Heparin Unfractionated: 0.17 [IU]/mL — ABNORMAL LOW (ref 0.30–0.70)

## 2024-03-19 LAB — MRSA NEXT GEN BY PCR, NASAL: MRSA by PCR Next Gen: NOT DETECTED

## 2024-03-19 LAB — PROTIME-INR
INR: 1.2 (ref 0.8–1.2)
Prothrombin Time: 16.1 s — ABNORMAL HIGH (ref 11.4–15.2)

## 2024-03-19 LAB — STREP PNEUMONIAE URINARY ANTIGEN: Strep Pneumo Urinary Antigen: NEGATIVE

## 2024-03-19 MED ORDER — DILTIAZEM HCL 30 MG PO TABS
30.0000 mg | ORAL_TABLET | Freq: Four times a day (QID) | ORAL | Status: AC
  Administered 2024-03-19 (×2): 30 mg via ORAL
  Filled 2024-03-19 (×2): qty 1

## 2024-03-19 MED ORDER — HEPARIN BOLUS VIA INFUSION
2000.0000 [IU] | Freq: Once | INTRAVENOUS | Status: AC
  Administered 2024-03-19: 2000 [IU] via INTRAVENOUS
  Filled 2024-03-19: qty 2000

## 2024-03-19 MED ORDER — HEPARIN BOLUS VIA INFUSION
3000.0000 [IU] | Freq: Once | INTRAVENOUS | Status: AC
  Administered 2024-03-19: 3000 [IU] via INTRAVENOUS
  Filled 2024-03-19: qty 3000

## 2024-03-19 MED ORDER — POTASSIUM CHLORIDE CRYS ER 20 MEQ PO TBCR
40.0000 meq | EXTENDED_RELEASE_TABLET | Freq: Once | ORAL | Status: AC
  Administered 2024-03-19: 40 meq via ORAL
  Filled 2024-03-19: qty 2

## 2024-03-19 MED ORDER — POTASSIUM CHLORIDE 10 MEQ/100ML IV SOLN
10.0000 meq | INTRAVENOUS | Status: AC
  Administered 2024-03-19 (×2): 10 meq via INTRAVENOUS
  Filled 2024-03-19 (×2): qty 100

## 2024-03-19 NOTE — Progress Notes (Signed)
 ANTICOAGULATION CONSULT NOTE  Pharmacy Consult for Heparin  Indication: pulmonary embolus  Allergies[1]  Patient Measurements: Height: 5' (152.4 cm) Weight: 66.2 kg (146 lb) IBW/kg (Calculated) : 45.5 Heparin  Dosing Weight: 59.7 kg  Vital Signs: Temp: 98.6 F (37 C) (02/06 0138) Temp Source: Oral (02/06 0138) BP: 121/64 (02/06 0130) Pulse Rate: 43 (02/06 0130)  Labs: Recent Labs    03/18/24 1334 03/19/24 0116  HGB 13.0  --   HCT 40.5  --   PLT 322  --   LABPROT  --  16.1*  INR  --  1.2  HEPARINUNFRC  --  0.14*  CREATININE 1.03*  --     Estimated Creatinine Clearance: 39.5 mL/min (A) (by C-G formula based on SCr of 1.03 mg/dL (H)).   Medical History: Past Medical History:  Diagnosis Date   Breast cancer (HCC)    Cancer (HCC) 2022   Breast Cancer   Diverticulosis    Dysrhythmia    Heart murmur    History of colonic polyps    History of DVT (deep vein thrombosis)    Right leg, April 2015   History of shingles    Mixed hyperlipidemia    Personal history of chemotherapy    Port-A-Cath in place 11/17/2020    Medications:  (Not in a hospital admission)  Scheduled:   azithromycin   500 mg Oral Daily   benzonatate   100 mg Oral TID   docusate sodium   100 mg Oral BID   guaiFENesin   600 mg Oral BID   heparin   2,000 Units Intravenous Once   ipratropium-albuterol   3 mL Nebulization Q6H   lidocaine   1 patch Transdermal Q24H   Infusions:   cefTRIAXone  (ROCEPHIN )  IV Stopped (03/18/24 2106)   cefTRIAXone  (ROCEPHIN )  IV     diltiazem  (CARDIZEM ) infusion 10 mg/hr (03/19/24 0047)   heparin  1,050 Units/hr (03/18/24 1731)   PRN: acetaminophen  **OR** acetaminophen , bisacodyl , ipratropium-albuterol , ondansetron  **OR** ondansetron  (ZOFRAN ) IV  Assessment: 78 yof presenting with SOB. Heparin  per pharmacy consult placed for pulmonary embolus.  CTA PE c/f PE  Patient is not on anticoagulation prior to arrival.  Hgb 13; plt 322  2/6 AM update:  Heparin  level  sub-therapeutic   Goal of Therapy:  Heparin  level 0.3-0.7 units/ml Monitor platelets by anticoagulation protocol: Yes   Plan:  Give IV heparin  2000 units bolus x 1 Inc heparin  to 1200 units/hr Check anti-Xa level in 8 hours and daily while on heparin  Continue to monitor H&H and platelets  Lynwood Mckusick, PharmD, BCPS Clinical Pharmacist Phone: [phone]         [1]  Allergies Allergen Reactions   Other Hives    Seasoning in Glory food product. Unsure of exactly what spice.    Boniva [Ibandronate] Other (See Comments)    Epigastric discomfort

## 2024-03-19 NOTE — Progress Notes (Signed)
 ANTICOAGULATION CONSULT NOTE  Pharmacy Consult for Heparin  Indication: pulmonary embolus + afib  Allergies[1]  Patient Measurements: Height: 5' (152.4 cm) Weight: 66.2 kg (146 lb) IBW/kg (Calculated) : 45.5 Heparin  Dosing Weight: 59.7 kg  Vital Signs: Temp: 98.3 F (36.8 C) (02/06 2014) Temp Source: Oral (02/06 2014) BP: 129/65 (02/06 2014) Pulse Rate: 85 (02/06 2014)  Labs: Recent Labs    03/18/24 1334 03/19/24 0116 03/19/24 0500 03/19/24 1127 03/19/24 2137  HGB 13.0  --  10.4*  --   --   HCT 40.5  --  32.1*  --   --   PLT 322  --  375  --   --   LABPROT  --  16.1*  --   --   --   INR  --  1.2  --   --   --   HEPARINUNFRC  --  0.14*  --  0.13* 0.17*  CREATININE 1.03*  --  0.81  --   --     Estimated Creatinine Clearance: 50.2 mL/min (by C-G formula based on SCr of 0.81 mg/dL).   Medical History: Past Medical History:  Diagnosis Date   Breast cancer (HCC)    Cancer (HCC) 2022   Breast Cancer   Diverticulosis    Dysrhythmia    Heart murmur    History of colonic polyps    History of DVT (deep vein thrombosis)    Right leg, April 2015   History of shingles    Mixed hyperlipidemia    Personal history of chemotherapy    Port-A-Cath in place 11/17/2020    Medications:  Medications Prior to Admission  Medication Sig Dispense Refill Last Dose/Taking   ibuprofen (ADVIL) 200 MG tablet Take 400 mg by mouth every 6 (six) hours as needed for mild pain (pain score 1-3), moderate pain (pain score 4-6) or headache.   03/17/2024   rosuvastatin (CRESTOR) 20 MG tablet Take 20 mg by mouth daily.   03/17/2024   Scheduled:   azithromycin   500 mg Oral Daily   benzonatate   100 mg Oral TID   diltiazem   30 mg Oral Q6H   docusate sodium   100 mg Oral BID   guaiFENesin   600 mg Oral BID   ipratropium-albuterol   3 mL Nebulization Q6H   lidocaine   1 patch Transdermal Q24H   Infusions:   cefTRIAXone  (ROCEPHIN )  IV 2 g (03/19/24 2039)   heparin  1,350 Units/hr (03/19/24 1345)    PRN: acetaminophen  **OR** acetaminophen , bisacodyl , ipratropium-albuterol , ondansetron  **OR** ondansetron  (ZOFRAN ) IV  Assessment: 78 yof presenting with SOB. Heparin  per pharmacy consult placed for pulmonary embolus. Patient is not on anticoagulation prior to arrival.  Heparin  level remains below goal (0.17) on infusion at 1350 units/hr. No issues with line or bleeding reported per RN.  Goal of Therapy:  Heparin  level 0.3-0.7 units/ml Monitor platelets by anticoagulation protocol: Yes   Plan:  Give IV heparin  2000 units bolus  Increase heparin  to 1600 units/hr Check anti-Xa level in 8 hours   Vito Ralph, PharmD, BCPS Please see amion for complete clinical pharmacist phone list  03/19/2024 10:33 PM              [1]  Allergies Allergen Reactions   Other Hives    Seasoning in Glory food product. Unsure of exactly what spice.    Boniva [Ibandronate] Other (See Comments)    Epigastric discomfort

## 2024-03-19 NOTE — Plan of Care (Signed)
  Problem: Education: Goal: Knowledge of General Education information will improve Description: Including pain rating scale, medication(s)/side effects and non-pharmacologic comfort measures Outcome: Progressing   Problem: Health Behavior/Discharge Planning: Goal: Ability to manage health-related needs will improve Outcome: Progressing   Problem: Clinical Measurements: Goal: Ability to maintain clinical measurements within normal limits will improve Outcome: Progressing Goal: Will remain free from infection Outcome: Progressing Goal: Diagnostic test results will improve Outcome: Progressing Goal: Respiratory complications will improve Outcome: Progressing Goal: Cardiovascular complication will be avoided Outcome: Progressing   Problem: Activity: Goal: Risk for activity intolerance will decrease Outcome: Progressing   Problem: Nutrition: Goal: Adequate nutrition will be maintained Outcome: Progressing   Problem: Coping: Goal: Level of anxiety will decrease Outcome: Progressing   Problem: Elimination: Goal: Will not experience complications related to bowel motility Outcome: Progressing Goal: Will not experience complications related to urinary retention Outcome: Progressing   Problem: Pain Managment: Goal: General experience of comfort will improve and/or be controlled Outcome: Progressing   Problem: Safety: Goal: Ability to remain free from injury will improve Outcome: Progressing   Problem: Skin Integrity: Goal: Risk for impaired skin integrity will decrease Outcome: Progressing   Problem: Activity: Goal: Ability to tolerate increased activity will improve Outcome: Progressing   Problem: Clinical Measurements: Goal: Ability to maintain a body temperature in the normal range will improve Outcome: Progressing   Problem: Respiratory: Goal: Ability to maintain adequate ventilation will improve Outcome: Progressing Goal: Ability to maintain a clear airway  will improve Outcome: Progressing   Problem: Education: Goal: Knowledge of disease or condition will improve Outcome: Progressing Goal: Understanding of medication regimen will improve Outcome: Progressing Goal: Individualized Educational Video(s) Outcome: Progressing   Problem: Activity: Goal: Ability to tolerate increased activity will improve Outcome: Progressing   Problem: Cardiac: Goal: Ability to achieve and maintain adequate cardiopulmonary perfusion will improve Outcome: Progressing   Problem: Health Behavior/Discharge Planning: Goal: Ability to safely manage health-related needs after discharge will improve Outcome: Progressing

## 2024-03-19 NOTE — TOC CM/SW Note (Addendum)
 Transition of Care [facility]) - Inpatient Brief Assessment   Patient Details  Name: Lisa Chapman MRN: [id number] Date of Birth: 08-Jun-1945  Transition of Care [facility]) CM/SW Contact:    Sudie Erminio Deems, RN Phone Number: 03/19/2024, 12:55 PM   Clinical Narrative: Patient presented for shortness of breath + for PE- continues on IV Heparin  gtt. Benefits check submitted for Xarelto  and Eliquis. PTA patient was independent from home with spouse. Patient does not use any DME and she is not active with a HH agency. PT/OT is following the patient for recommendations. ICM will continue to follow for disposition needs as she progresses.    1319 03-19-24  Eliquis copay is $248.41, Xarelto  copay is $206.14 copays so high due to a deductible   Transition of Care Asessment: Insurance and Status: Insurance coverage has been reviewed Patient has primary care physician: Yes (Message sent to CMA for PCP appointment.) Home environment has been reviewed: reviewed Prior level of function:: independent Prior/Current Home Services: No current home services Social Drivers of Health Review: SDOH reviewed no interventions necessary Readmission risk has been reviewed: Yes Transition of care needs: no transition of care needs at this time

## 2024-03-19 NOTE — Telephone Encounter (Signed)
 Patient Product/process Development Scientist completed.    The patient is insured through [employer]. Patient has Medicare and is not eligible for a copay card, but may be able to apply for patient assistance or Medicare RX Payment Plan (Patient Must reach out to their plan, if eligible for payment plan), if available.    Ran test claim for Eliquis 5 mg and the current 30 day co-pay is $248.41 due to a deductible.  Ran test claim for Xarelto  20 mg and the current 30 day co-pay is $206.14 due to a deductible.  This test claim was processed through [facility]- copay amounts may vary at other pharmacies due to pharmacy/plan contracts, or as the patient moves through the different stages of their insurance plan.     Lisa Chapman, CPHT Pharmacy Technician Patient Advocate Specialist Lead [facility] Patient Advocate Team Direct Number: [phone]  Fax: [phone]

## 2024-03-19 NOTE — Progress Notes (Signed)
 OT Cancellation Note  Patient Details Name: Lisa Chapman MRN: [id number] DOB: 04-27-1945   Cancelled Treatment:    Reason Eval/Treat Not Completed: Medical issues which prohibited therapy (Pt with acute PE and started on Heparin  as of 4:51pm 03/18/24. OT to follow up once pt is therapeutic)  Lucie JONETTA Kendall 03/19/2024, 9:59 AM

## 2024-03-19 NOTE — Progress Notes (Signed)
 PT Cancellation Note  Patient Details Name: Lisa Chapman MRN: [id number] DOB: 1945/07/22   Cancelled Treatment:    Reason Eval/Treat Not Completed: Medical issues which prohibited therapy;Patient not medically ready. Pt with acute PE and started on Heparin  as of 4:51pm 03/18/24. Pt not currently appropriate for PT evaluation. Will wait 24hrs from initial start to Heparin  to begin therapy.   Isaiah DEL. Sydny Schnitzler, PT, DPT  [employer] 03/19/2024, 8:24 AM

## 2024-03-19 NOTE — Progress Notes (Signed)
 " PROGRESS NOTE    Lisa Chapman  FMW:[id number] DOB: 1945-05-20 DOA: 03/18/2024 PCP: Rosamond Leta NOVAK, MD  Outpatient Specialists:     Brief Narrative:  As per H&P done on presentation: Patient with PMH of breast cancer, HLD, recurrent DVT, paroxysmal A-fib present to the hospital with complaints of shortness of breath. She reports that for the last 2 months since December she starts to have progressively worsening fatigue and shortness of breath especially on exertion. It has been difficult for her to get through about in January. She started having some complaints of chest pain on right side since last 2 weeks. She reports chills with night sweats since last 1 week.  No fever. She has ongoing cough which is unchanged and nonproductive since last 31-month. She denies any rash or lumps or bumps or change in her medications lately. She is supposed to be on Xarelto  but she stopped taking that medication for last 2 years. Primarily due to cost. She denies any active smoking.  No nausea no vomiting.  No alcohol  abuse. Denies any choking episode. No diarrhea no constipation.  No active bleeding.  03/19/2024: Patient seen.  Patient reports that she is improving.   Assessment & Plan:   Principal Problem:   Paroxysmal atrial fibrillation with rapid ventricular response (HCC) Active Problems:   Mixed hyperlipidemia   DVT (deep venous thrombosis) (HCC)   Malignant neoplasm of upper-outer quadrant of right breast in female, estrogen receptor positive (HCC)   Cavitary lesion of lung   Right lower lobe pneumonia   Weight loss, unintentional   Pulmonary embolus (HCC)   Likely pulmonary embolism:  -CTA chest finding is noted. -History of shortness of breath.  History of breast cancer.   - Patient discontinued Xarelto  due to cost.   - Continue heparin  drip.   -Doppler ultrasound of lower extremities negative for DVT.     Paroxysmal A-fib with RVR: -Heart rate is currently controlled. -  Continue heparin  drip. - Follow-up with cardiology team on discharge.      Possible right-sided pneumonia: -Left lung cavitary lesion. -Continue IV ceftriaxone  -Procalcitonin is 0.41. As per prior documentation Pulmonary consulted appreciate consult.   History of breast cancer: Appears to be stable for now.   Coronary artery disease: -Stable.   Possible mild AKI: Baseline creatinine appears to be around 0.7. Creatinine upon admission was 1.03. 03/19/2024: AKI has resolved.  Serum creatinine 0.81 today.     Possible acute on chronic HFpEF: Echocardiogram ordered.   DVT prophylaxis: Heparin  drip. Code Status: Full code. Family Communication:  Disposition Plan:    Consultants:  Pulmonary and critical care.  Procedures:  None.  Antimicrobials:  Azithromycin  500 mg p.o. daily. IV Rocephin  2 g every 24 hours.   Subjective: Shortness of breath is improving. No fever or chills  Objective: Vitals:   03/19/24 0900 03/19/24 0930 03/19/24 1118 03/19/24 1723  BP:  (!) 127/53 (!) 139/92 116/72  Pulse:  92  96  Resp:  (!) 37 20 18  Temp: 98.9 F (37.2 C)  98.3 F (36.8 C) 99.4 F (37.4 C)  TempSrc: Oral  Oral Oral  SpO2:  91%  (!) 86%  Weight:      Height:       No intake or output data in the 24 hours ending 03/19/24 1802 Filed Weights   03/18/24 1314  Weight: 66.2 kg    Examination:  General exam: Appears calm and comfortable  Respiratory system: Clear to auscultation. Respiratory effort normal.  Cardiovascular system: S1 & S2 heard,  Gastrointestinal system: Abdomen is obese, soft and nontender. Central nervous system: Alert and oriented. No focal neurological deficits.  Data Reviewed: I have personally reviewed following labs and imaging studies  CBC: Recent Labs  Lab 03/18/24 1334 03/19/24 0500  WBC 14.5* 17.6*  NEUTROABS 12.6*  --   HGB 13.0 10.4*  HCT 40.5 32.1*  MCV 86.0 85.8  PLT 322 375   Basic Metabolic Panel: Recent Labs  Lab  03/18/24 1334 03/19/24 0500  NA 135 135  K 4.4 3.4*  CL 100 99  CO2 21* 21*  GLUCOSE 119* 118*  BUN 17 11  CREATININE 1.03* 0.81  CALCIUM 9.4 8.7*   GFR: Estimated Creatinine Clearance: 50.2 mL/min (by C-G formula based on SCr of 0.81 mg/dL). Liver Function Tests: Recent Labs  Lab 03/18/24 1334  AST 40  ALT 28  ALKPHOS 76  BILITOT 0.6  PROT 8.3*  ALBUMIN 3.7   No results for input(s): LIPASE, AMYLASE in the last 168 hours. No results for input(s): AMMONIA in the last 168 hours. Coagulation Profile: Recent Labs  Lab 03/19/24 0116  INR 1.2   Cardiac Enzymes: No results for input(s): CKTOTAL, CKMB, CKMBINDEX, TROPONINI in the last 168 hours. BNP (last 3 results) Recent Labs    03/18/24 1334  PROBNP 1,353.0*   HbA1C: Recent Labs    03/18/24 1744  HGBA1C 6.4*   CBG: No results for input(s): GLUCAP in the last 168 hours. Lipid Profile: No results for input(s): CHOL, HDL, LDLCALC, TRIG, CHOLHDL, LDLDIRECT in the last 72 hours. Thyroid  Function Tests: No results for input(s): TSH, T4TOTAL, FREET4, T3FREE, THYROIDAB in the last 72 hours. Anemia Panel: No results for input(s): VITAMINB12, FOLATE, FERRITIN, TIBC, IRON, RETICCTPCT in the last 72 hours. Urine analysis: No results found for: COLORURINE, APPEARANCEUR, LABSPEC, PHURINE, GLUCOSEU, HGBUR, BILIRUBINUR, KETONESUR, PROTEINUR, UROBILINOGEN, NITRITE, LEUKOCYTESUR Sepsis Labs: @LABRCNTIP (procalcitonin:4,lacticidven:4)  ) Recent Results (from the past 240 hours)  Culture, blood (Routine X 2) w Reflex to ID Panel     Status: None (Preliminary result)   Collection Time: 03/18/24  4:43 PM   Specimen: BLOOD LEFT FOREARM  Result Value Ref Range Status   Specimen Description BLOOD LEFT FOREARM  Final   Special Requests   Final    BOTTLES DRAWN AEROBIC AND ANAEROBIC Blood Culture results may not be optimal due to an inadequate volume of  blood received in culture bottles   Culture   Final    NO GROWTH < 12 HOURS Performed at [facility], [address] [place], [place] [address]    Report Status PENDING  Incomplete  Culture, blood (Routine X 2) w Reflex to ID Panel     Status: None (Preliminary result)   Collection Time: 03/18/24  4:48 PM   Specimen: BLOOD RIGHT HAND  Result Value Ref Range Status   Specimen Description BLOOD RIGHT HAND  Final   Special Requests   Final    BOTTLES DRAWN AEROBIC AND ANAEROBIC Blood Culture results may not be optimal due to an inadequate volume of blood received in culture bottles   Culture   Final    NO GROWTH < 12 HOURS Performed at [facility], [address] [place], [place] [address]    Report Status PENDING  Incomplete  MRSA Next Gen by PCR, Nasal     Status: None   Collection Time: 03/19/24  2:41 PM   Specimen: Nasal Mucosa; Nasal Swab  Result Value Ref  Range Status   MRSA by PCR Next Gen NOT DETECTED NOT DETECTED Final    Comment: (NOTE) The GeneXpert MRSA Assay (FDA approved for NASAL specimens only), is one component of a comprehensive MRSA colonization surveillance program. It is not intended to diagnose MRSA infection nor to guide or monitor treatment for MRSA infections. Test performance is not FDA approved in patients less than 33 years old. Performed at [facility], [address], [place], [place] [address]          Radiology Studies: VAS US  LOWER EXTREMITY VENOUS (DVT) Result Date: 03/19/2024  Lower Venous DVT Study Patient Name:  Lisa Chapman  Date of Exam:   03/19/2024 Medical Rec #: [id number]       Accession #:    [id number] Date of Birth: 01-11-1946        Patient Gender: F Patient Age:   29 years Exam Location:  [facility] Procedure:      VAS US  LOWER EXTREMITY VENOUS (DVT) Referring Phys: DORN CHILL --------------------------------------------------------------------------------  Indications: Pulmonary embolism.  Risk  Factors: Confirmed PE. Anticoagulation: Heparin . Comparison Study: No prior studies. Performing Technologist: Cordella Collet RVT  Examination Guidelines: A complete evaluation includes B-mode imaging, spectral Doppler, color Doppler, and power Doppler as needed of all accessible portions of each vessel. Bilateral testing is considered an integral part of a complete examination. Limited examinations for reoccurring indications may be performed as noted. The reflux portion of the exam is performed with the patient in reverse Trendelenburg.  +---------+---------------+---------+-----------+----------+--------------+ RIGHT    CompressibilityPhasicitySpontaneityPropertiesThrombus Aging +---------+---------------+---------+-----------+----------+--------------+ CFV      Full           Yes      Yes                                 +---------+---------------+---------+-----------+----------+--------------+ SFJ      Full                                                        +---------+---------------+---------+-----------+----------+--------------+ FV Prox  Full                                                        +---------+---------------+---------+-----------+----------+--------------+ FV Mid   Full                                                        +---------+---------------+---------+-----------+----------+--------------+ FV DistalFull                                                        +---------+---------------+---------+-----------+----------+--------------+ PFV      Full                                                        +---------+---------------+---------+-----------+----------+--------------+  POP      Full           Yes      Yes                                 +---------+---------------+---------+-----------+----------+--------------+ PTV      Full                                                         +---------+---------------+---------+-----------+----------+--------------+ PERO     Full                                                        +---------+---------------+---------+-----------+----------+--------------+   +---------+---------------+---------+-----------+----------+--------------+ LEFT     CompressibilityPhasicitySpontaneityPropertiesThrombus Aging +---------+---------------+---------+-----------+----------+--------------+ CFV      Full           Yes      Yes                                 +---------+---------------+---------+-----------+----------+--------------+ SFJ      Full                                                        +---------+---------------+---------+-----------+----------+--------------+ FV Prox  Full                                                        +---------+---------------+---------+-----------+----------+--------------+ FV Mid   Full                                                        +---------+---------------+---------+-----------+----------+--------------+ FV DistalFull                                                        +---------+---------------+---------+-----------+----------+--------------+ PFV      Full                                                        +---------+---------------+---------+-----------+----------+--------------+ POP      Full           Yes      Yes                                 +---------+---------------+---------+-----------+----------+--------------+  PTV      Full                                                        +---------+---------------+---------+-----------+----------+--------------+ PERO     Full                                                        +---------+---------------+---------+-----------+----------+--------------+     Summary: RIGHT: - There is no evidence of deep vein thrombosis in the lower extremity.  - No cystic structure found in  the popliteal fossa.  LEFT: - There is no evidence of deep vein thrombosis in the lower extremity.  - No cystic structure found in the popliteal fossa.  *See table(s) above for measurements and observations. Electronically signed by Penne Colorado MD on 03/19/2024 at 5:55:27 PM.    Final    ECHOCARDIOGRAM COMPLETE Result Date: 03/19/2024    ECHOCARDIOGRAM REPORT   Patient Name:   Lisa Chapman Date of Exam: 03/19/2024 Medical Rec #:  [id number]      Height:       60.0 in Accession #:    [id number]     Weight:       146.0 lb Date of Birth:  [birth year]       BSA:          1.633 m Patient Age:    78 years       BP:           114/60 mmHg Patient Gender: F              HR:           81 bpm. Exam Location:  Inpatient Procedure: 2D Echo, Cardiac Doppler and Color Doppler (Both Spectral and Color            Flow Doppler were utilized during procedure). Indications:     Atrial fibrillation  History:         Patient has prior history of Echocardiogram examinations, most                  recent 08/10/2021.  Sonographer:     Philomena Daring Referring Phys:  YETTA HERO PATEL Diagnosing Phys: Lonni Nanas MD IMPRESSIONS  1. Left ventricular ejection fraction, by estimation, is 65 to 70%. The left ventricle has normal function. The left ventricle has no regional wall motion abnormalities. There is mild left ventricular hypertrophy. Left ventricular diastolic parameters were normal.  2. Right ventricular systolic function is normal. The right ventricular size is normal. There is mildly elevated pulmonary artery systolic pressure. The estimated right ventricular systolic pressure is 38.9 mmHg.  3. The mitral valve is normal in structure. Trivial mitral valve regurgitation. No evidence of mitral stenosis.  4. The aortic valve is tricuspid. Aortic valve regurgitation is not visualized. Aortic valve sclerosis is present, with no evidence of aortic valve stenosis.  5. The inferior vena cava is normal in size with <50% respiratory  variability, suggesting right atrial pressure of 8 mmHg. FINDINGS  Left Ventricle: Left ventricular ejection fraction, by estimation, is 65 to 70%. The left ventricle has normal function. The left ventricle  has no regional wall motion abnormalities. The left ventricular internal cavity size was normal in size. There is  mild left ventricular hypertrophy. Left ventricular diastolic parameters were normal. Right Ventricle: The right ventricular size is normal. No increase in right ventricular wall thickness. Right ventricular systolic function is normal. There is mildly elevated pulmonary artery systolic pressure. The tricuspid regurgitant velocity is 2.78  m/s, and with an assumed right atrial pressure of 8 mmHg, the estimated right ventricular systolic pressure is 38.9 mmHg. Left Atrium: Left atrial size was normal in size. Right Atrium: Right atrial size was normal in size. Pericardium: Trivial pericardial effusion is present. Mitral Valve: The mitral valve is normal in structure. There is mild thickening of the mitral valve leaflet(s). Trivial mitral valve regurgitation. No evidence of mitral valve stenosis. Tricuspid Valve: The tricuspid valve is normal in structure. Tricuspid valve regurgitation is trivial. No evidence of tricuspid stenosis. Aortic Valve: The aortic valve is tricuspid. Aortic valve regurgitation is not visualized. Aortic valve sclerosis is present, with no evidence of aortic valve stenosis. Aortic valve mean gradient measures 7.2 mmHg. Aortic valve peak gradient measures 14.0 mmHg. Aortic valve area, by VTI measures 2.07 cm. Pulmonic Valve: The pulmonic valve was not well visualized. Pulmonic valve regurgitation is trivial. No evidence of pulmonic stenosis. Aorta: The aortic root and ascending aorta are structurally normal, with no evidence of dilitation. Venous: The inferior vena cava is normal in size with less than 50% respiratory variability, suggesting right atrial pressure of 8 mmHg.  IAS/Shunts: The atrial septum is grossly normal.  LEFT VENTRICLE PLAX 2D LVIDd:         4.10 cm   Diastology LVIDs:         2.50 cm   LV e' medial:    7.29 cm/s LV PW:         1.10 cm   LV E/e' medial:  8.7 LV IVS:        1.10 cm   LV e' lateral:   10.20 cm/s LVOT diam:     1.80 cm   LV E/e' lateral: 6.2 LV SV:         66 LV SV Index:   40 LVOT Area:     2.54 cm  RIGHT VENTRICLE             IVC RV Basal diam:  3.00 cm     IVC diam: 1.50 cm RV Mid diam:    2.30 cm RV S prime:     14.60 cm/s TAPSE (M-mode): 1.8 cm LEFT ATRIUM             Index        RIGHT ATRIUM           Index LA diam:        3.70 cm 2.27 cm/m   RA Area:     16.20 cm LA Vol (A2C):   49.0 ml 30.01 ml/m  RA Volume:   38.10 ml  23.33 ml/m LA Vol (A4C):   42.0 ml 25.72 ml/m LA Biplane Vol: 45.9 ml 28.11 ml/m  AORTIC VALVE AV Area (Vmax):    2.02 cm AV Area (Vmean):   2.03 cm AV Area (VTI):     2.07 cm AV Vmax:           187.00 cm/s AV Vmean:          122.250 cm/s AV VTI:            0.318 m AV Peak  Grad:      14.0 mmHg AV Mean Grad:      7.2 mmHg LVOT Vmax:         148.50 cm/s LVOT Vmean:        97.350 cm/s LVOT VTI:          0.259 m LVOT/AV VTI ratio: 0.81  AORTA Ao Root diam: 2.70 cm Ao Asc diam:  2.90 cm MITRAL VALVE               TRICUSPID VALVE MV Area (PHT): 3.50 cm    TR Peak grad:   30.9 mmHg MV Decel Time: 217 msec    TR Vmax:        278.00 cm/s MV E velocity: 63.40 cm/s MV A velocity: 84.40 cm/s  SHUNTS MV E/A ratio:  0.75        Systemic VTI:  0.26 m                            Systemic Diam: 1.80 cm Lonni Nanas MD Electronically signed by Lonni Nanas MD Signature Date/Time: 03/19/2024/1:58:41 PM    Final (Updated)    CT Angio Chest PE W and/or Wo Contrast Result Date: 03/18/2024 CLINICAL DATA:  Shortness of breath, pleuritic chest pain. EXAM: CT ANGIOGRAPHY CHEST WITH CONTRAST TECHNIQUE: Multidetector CT imaging of the chest was performed using the standard protocol during bolus administration of intravenous  contrast. Multiplanar CT image reconstructions and MIPs were obtained to evaluate the vascular anatomy. RADIATION DOSE REDUCTION: This exam was performed according to the departmental dose-optimization program which includes automated exposure control, adjustment of the mA and/or kV according to patient size and/or use of iterative reconstruction technique. CONTRAST:  75mL OMNIPAQUE  IOHEXOL  350 MG/ML SOLN COMPARISON:  Radiograph of same day.  PET scan of March 14, 2021. FINDINGS: Cardiovascular: Filling defect is seen in lower lobe branch of left pulmonary artery concerning for pulmonary embolus, best seen on image number 52 of series 8. Mild cardiomegaly is noted. Aortic atherosclerosis is noted without aneurysm or dissection. Coronary artery calcifications are noted. No pericardial effusion is noted. Mediastinum/Nodes: Thyroid  gland is unremarkable. Small sliding-type hiatal hernia is noted. 12 mm right subcarinal lymph node is noted which is enlarged compared to prior exam. Right infrahilar adenopathy is noted as well, with the largest measuring 14 mm. It is uncertain if this is inflammatory or neoplastic in etiology. Smaller precarinal and right paratracheal lymph nodes are noted of uncertain etiology. Lungs/Pleura: No pneumothorax or pleural effusion is noted. 20 x 18 mm rounded abnormality is noted laterally in the left lower lobe with hypodense center concerning for cavitary pneumonia or possibly infarction due to adjacent pulmonary embolus. Minimal emphysematous disease is noted in the upper lobes. Patchy opacity is noted in the right lower lobe posteriorly concerning for pneumonia or atelectasis with associated bronchial wall thickening. Potentially this may be due to aspiration or potentially related to malignancy. Upper Abdomen: No acute abnormality. Musculoskeletal: No chest wall abnormality. No acute or significant osseous findings. Review of the MIP images confirms the above findings. IMPRESSION: 1.  Filling defect is seen in lower lobe branch of left pulmonary artery concerning for pulmonary embolus. Critical Value/emergent results were called by telephone at the time of interpretation on 03/18/2024 at 3:44 pm to provider Dr. Emil, who verbally acknowledged these results. 2. 20 x 18 mm rounded abnormality is noted laterally in the left lower lobe with hypodense center concerning for cavitary pneumonia or  possibly infarction due to adjacent pulmonary embolus. 3. Patchy opacity is noted in the right lower lobe posteriorly concerning for pneumonia or atelectasis with associated bronchial wall thickening. Potentially this may be due to aspiration or potentially related to malignancy. 4. Right infrahilar and subcarinal adenopathy is noted which may be inflammatory or neoplastic in etiology. 5. Coronary artery calcifications are noted. 6. Small sliding-type hiatal hernia. Aortic Atherosclerosis (ICD10-I70.0) and Emphysema (ICD10-J43.9). Electronically Signed   By: Lynwood Landy Raddle M.D.   On: 03/18/2024 15:44   DG Chest 2 View Result Date: 03/18/2024 EXAM: 2 VIEW(S) XRAY OF THE CHEST 03/18/2024 01:51:00 PM COMPARISON: None available. CLINICAL HISTORY: Shortness of breath, pleuritic chest pain. FINDINGS: LUNGS AND PLEURA: Low lung volumes. Ill-defined opacities in bilateral lung bases. Small bilateral pleural effusions. No pneumothorax. HEART AND MEDIASTINUM: Mild cardiomegaly. BONES AND SOFT TISSUES: No acute osseous abnormality. IMPRESSION: 1. Small bilateral pleural effusions. 2. Ill-defined opacities in the bilateral lung bases, possibly related to low lung volumes. 3. Mild cardiomegaly. Electronically signed by: Norleen Boxer MD 03/18/2024 02:02 PM EST RP Workstation: [id number]        Scheduled Meds:  azithromycin   500 mg Oral Daily   benzonatate   100 mg Oral TID   diltiazem   30 mg Oral Q6H   docusate sodium   100 mg Oral BID   guaiFENesin   600 mg Oral BID   ipratropium-albuterol   3 mL Nebulization Q6H    lidocaine   1 patch Transdermal Q24H   Continuous Infusions:  cefTRIAXone  (ROCEPHIN )  IV Stopped (03/18/24 2106)   heparin  1,350 Units/hr (03/19/24 1345)     LOS: 1 day    Time spent: 55 minutes.    Leatrice Chapel, MD  [facility] 7PM-7AM contact night coverage as above    "

## 2024-03-19 NOTE — Evaluation (Signed)
 RT Evaluate and Treat Note  03/19/2024   Breathing is (select one): Worse than normal   The following was found on auscultation (select multiple):  Bilateral Breath Sounds: Diminished;Coarse crackles (03/18/24 1749)             Cough Assessment:      Most Recent Chest Xray:... (DG Chest 2 View Result Date: 03/18/2024 EXAM: 2 VIEW(S) XRAY OF THE CHEST 03/18/2024 01:51:00 PM COMPARISON: None available. CLINICAL HISTORY: Shortness of breath, pleuritic chest pain. FINDINGS: LUNGS AND PLEURA: Low lung volumes. Ill-defined opacities in bilateral lung bases. Small bilateral pleural effusions. No pneumothorax. HEART AND MEDIASTINUM: Mild cardiomegaly. BONES AND SOFT TISSUES: No acute osseous abnormality. IMPRESSION: 1. Small bilateral pleural effusions. 2. Ill-defined opacities in the bilateral lung bases, possibly related to low lung volumes. 3. Mild cardiomegaly. Electronically signed by: Norleen Boxer MD 03/18/2024 02:02 PM EST RP Workstation: [id number]      The following medications and/or interventions were ordered/changed/discontinued as part of the Respiratory Treatment protocol:   Medication Changes: No Change Duo Q6    Airway Clearance Changes: No Change   Oxygen Therapy Changes: 2L [place]

## 2024-03-19 NOTE — Progress Notes (Signed)
 ANTICOAGULATION CONSULT NOTE  Pharmacy Consult for Heparin  Indication: pulmonary embolus + afib  Allergies[1]  Patient Measurements: Height: 5' (152.4 cm) Weight: 66.2 kg (146 lb) IBW/kg (Calculated) : 45.5 Heparin  Dosing Weight: 59.7 kg  Vital Signs: Temp: 98.3 F (36.8 C) (02/06 1118) Temp Source: Oral (02/06 1118) BP: 139/92 (02/06 1118) Pulse Rate: 92 (02/06 0930)  Labs: Recent Labs    03/18/24 1334 03/19/24 0116 03/19/24 0500 03/19/24 1127  HGB 13.0  --  10.4*  --   HCT 40.5  --  32.1*  --   PLT 322  --  375  --   LABPROT  --  16.1*  --   --   INR  --  1.2  --   --   HEPARINUNFRC  --  0.14*  --  0.13*  CREATININE 1.03*  --  0.81  --     Estimated Creatinine Clearance: 50.2 mL/min (by C-G formula based on SCr of 0.81 mg/dL).   Medical History: Past Medical History:  Diagnosis Date   Breast cancer (HCC)    Cancer (HCC) 2022   Breast Cancer   Diverticulosis    Dysrhythmia    Heart murmur    History of colonic polyps    History of DVT (deep vein thrombosis)    Right leg, April 2015   History of shingles    Mixed hyperlipidemia    Personal history of chemotherapy    Port-A-Cath in place 11/17/2020    Medications:  Medications Prior to Admission  Medication Sig Dispense Refill Last Dose/Taking   ibuprofen (ADVIL) 200 MG tablet Take 400 mg by mouth every 6 (six) hours as needed for mild pain (pain score 1-3), moderate pain (pain score 4-6) or headache.   03/17/2024   rosuvastatin (CRESTOR) 20 MG tablet Take 20 mg by mouth daily.   03/17/2024   Scheduled:   azithromycin   500 mg Oral Daily   benzonatate   100 mg Oral TID   docusate sodium   100 mg Oral BID   guaiFENesin   600 mg Oral BID   ipratropium-albuterol   3 mL Nebulization Q6H   lidocaine   1 patch Transdermal Q24H   potassium chloride   40 mEq Oral Once   Infusions:   cefTRIAXone  (ROCEPHIN )  IV Stopped (03/18/24 2106)   diltiazem  (CARDIZEM ) infusion 10 mg/hr (03/19/24 0902)   heparin  1,200  Units/hr (03/19/24 1001)   potassium chloride      PRN: acetaminophen  **OR** acetaminophen , bisacodyl , ipratropium-albuterol , ondansetron  **OR** ondansetron  (ZOFRAN ) IV  Assessment: 9 yof presenting with SOB. Heparin  per pharmacy consult placed for pulmonary embolus.  CTA PE c/f PE  Patient is not on anticoagulation prior to arrival.  Hgb 13; plt 322  Heparin  level this afternoon is below goal at 0.13.  No overt bleeding or complications noted.  No known issues with heparin  infusion.   Goal of Therapy:  Heparin  level 0.3-0.7 units/ml Monitor platelets by anticoagulation protocol: Yes   Plan:  Give IV heparin  3000 units bolus x 1 Inc heparin  to 1350 units/hr Check anti-Xa level in 8 hours and daily while on heparin  Continue to monitor H&H and platelets  Harlene Barlow, Berdine BIRCH, BCPS, BCCP Clinical Pharmacist  03/19/2024 1:27 PM   [facility] pharmacy phone numbers are listed on [email or web address]           [1]  Allergies Allergen Reactions   Other Hives    Seasoning in Glory food product. Unsure of exactly what spice.    Boniva [Ibandronate] Other (See Comments)    Epigastric  discomfort

## 2024-03-19 NOTE — Progress Notes (Signed)
 Bilateral lower extremity venous duplex has been completed. Preliminary results can be found in CV Proc through chart review.   03/19/24 2:20 PM Cathlyn Collet RVT

## 2024-03-19 NOTE — Progress Notes (Addendum)
 "  NAME:  Lisa Chapman, MRN:  [id number], DOB:  23-Dec-1945, LOS: 1 ADMISSION DATE:  03/18/2024, CONSULTATION DATE:  03/18/24 REFERRING MD:  TRH CHIEF COMPLAINT:  Pulmonary Emboli and Pneumonia   History of Present Illness:  Lisa Chapman is a 79 year old woman with history of breast cancer s/p chemotherapy and lumpectomy in 2023 followed by hormone therapy for 2 years and DVT 05/2013 who presents with multiple weeks of feeling bad, chills, shortness of breath and chest pains.   CTA PE study showed left lower lobar pulmonary emboli and 20 x 18mm rounded abnormality in the left lower lobe with hypodense center. Patchy opacity noted in the right lower lobe.   She was noted to be in atrial fibrillation with RVR and started on diltiazem  drip.   PCCM consulted for evaluation of PE and pneumonia.  Pertinent  Medical History   Past Medical History:  Diagnosis Date   Breast cancer (HCC)    Cancer (HCC) 2022   Breast Cancer   Diverticulosis    Dysrhythmia    Heart murmur    History of colonic polyps    History of DVT (deep vein thrombosis)    Right leg, April 2015   History of shingles    Mixed hyperlipidemia    Personal history of chemotherapy    Port-A-Cath in place 11/17/2020   Significant Hospital Events: Including procedures, antibiotic start and stop dates in addition to other pertinent events   2/5 admitted  Interim History / Subjective:  Increasing oxygen requirements from 2L to 4L. Tells me breathing is feeling much better but still intermittently has dyspnea with exertion. Denies chest pain.   Objective    Blood pressure (!) 139/92, pulse 92, temperature 98.3 F (36.8 C), temperature source Oral, resp. rate 20, height 5' (1.524 m), weight 66.2 kg, SpO2 91%.        Intake/Output Summary (Last 24 hours) at 03/19/2024 1255 Last data filed at 03/18/2024 1614 Gross per 24 hour  Intake 10.68 ml  Output --  Net 10.68 ml   Filed Weights   03/18/24 1314  Weight: 66.2 kg     Examination: General: elderly female, NAD HENT: [place]/AT, MMM Lungs: breathing comfortably on nasal cannula, diminished breath sounds  Cardiovascular: irregularly irregular, S1S2 Abdomen: soft, non-tender, non-distended, +BS Extremities: warm, dry, no edema  Neuro: alert and responsive, moves all extremities  GU: n/a  Resolved problem list   Assessment and Plan   Acute Pulmonary Embolism Sepsis due to Right Lower Lobe Pneumonia Atrial Fibrillation with RVR AKI Elevated BNP Echo with EF 65-70%, normal RV, mildly elevated PA pressures. Troponin flat. Pro-BNP elevated. PESI score 126.   Plan: - continue heparin  drip for now - patient's oxygen requirements have been increasing and SpO2 borderline, if better or stable tomorrow can likely switch to DOAC. Patient reports DOAC was cost-prohibitive in the past so would explore options with her prior to initiation. Suspect pneumonia also contributing to hypoxia  - consider switching from diltiazem  to amiodarone especially if blood pressure marginal or becomes hypotensive  - continue ceftriaxone  and azithromycin , follow up urine legionella and urine strep pneumo ag  - follow up blood cultures  - follow up BLE duplex  - will need follow up CT Chest in 6-8 weeks to ensure resolution of current findings given history of breast cancer  PCCM will follow  Labs   CBC: Recent Labs  Lab 03/18/24 1334 03/19/24 0500  WBC 14.5* 17.6*  NEUTROABS 12.6*  --  HGB 13.0 10.4*  HCT 40.5 32.1*  MCV 86.0 85.8  PLT 322 375    Basic Metabolic Panel: Recent Labs  Lab 03/18/24 1334 03/19/24 0500  NA 135 135  K 4.4 3.4*  CL 100 99  CO2 21* 21*  GLUCOSE 119* 118*  BUN 17 11  CREATININE 1.03* 0.81  CALCIUM 9.4 8.7*   GFR: Estimated Creatinine Clearance: 50.2 mL/min (by C-G formula based on SCr of 0.81 mg/dL). Recent Labs  Lab 03/18/24 1334 03/18/24 1744 03/19/24 0500  PROCALCITON  --  0.41  --   WBC 14.5*  --  17.6*    Liver  Function Tests: Recent Labs  Lab 03/18/24 1334  AST 40  ALT 28  ALKPHOS 76  BILITOT 0.6  PROT 8.3*  ALBUMIN 3.7   No results for input(s): LIPASE, AMYLASE in the last 168 hours. No results for input(s): AMMONIA in the last 168 hours.  ABG No results found for: PHART, PCO2ART, PO2ART, HCO3, TCO2, ACIDBASEDEF, O2SAT   Coagulation Profile: Recent Labs  Lab 03/19/24 0116  INR 1.2    Cardiac Enzymes: No results for input(s): CKTOTAL, CKMB, CKMBINDEX, TROPONINI in the last 168 hours.  HbA1C: Hgb A1c MFr Bld  Date/Time Value Ref Range Status  03/18/2024 05:44 PM 6.4 (H) 4.8 - 5.6 % Final    Comment:    (NOTE) Diagnosis of Diabetes The following HbA1c ranges recommended by the American Diabetes Association (ADA) may be used as an aid in the diagnosis of diabetes mellitus.  Hemoglobin             Suggested A1C NGSP%              Diagnosis  <5.7                   Non Diabetic  5.7-6.4                Pre-Diabetic  >6.4                   Diabetic  <7.0                   Glycemic control for                       adults with diabetes.      CBG: No results for input(s): GLUCAP in the last 168 hours.  Critical care time: n/a    Rexene LOISE Tanda DEVONNA [facility] 03/19/24 12:56 PM  Please see [email or web address] for pager details.  From 7A-7P if no response, please call [phone] After hours, please call ELink [phone]         "

## 2024-12-16 ENCOUNTER — Inpatient Hospital Stay: Admitting: Hematology and Oncology
